# Patient Record
Sex: Female | Born: 1950
Health system: Southern US, Community
[De-identification: ages and names within clinical notes are randomized; demographics above are authoritative.]

## PROBLEM LIST (undated history)

## (undated) DIAGNOSIS — F419 Anxiety disorder, unspecified: Secondary | ICD-10-CM

## (undated) DIAGNOSIS — G4730 Sleep apnea, unspecified: Secondary | ICD-10-CM

## (undated) DIAGNOSIS — D649 Anemia, unspecified: Secondary | ICD-10-CM

## (undated) DIAGNOSIS — M5432 Sciatica, left side: Secondary | ICD-10-CM

## (undated) DIAGNOSIS — E119 Type 2 diabetes mellitus without complications: Secondary | ICD-10-CM

## (undated) DIAGNOSIS — C8300 Small cell B-cell lymphoma, unspecified site: Secondary | ICD-10-CM

## (undated) DIAGNOSIS — F32A Depression, unspecified: Secondary | ICD-10-CM

## (undated) DIAGNOSIS — M5431 Sciatica, right side: Secondary | ICD-10-CM

## (undated) DIAGNOSIS — K5090 Crohn's disease, unspecified, without complications: Secondary | ICD-10-CM

## (undated) DIAGNOSIS — K219 Gastro-esophageal reflux disease without esophagitis: Secondary | ICD-10-CM

## (undated) DIAGNOSIS — I10 Essential (primary) hypertension: Secondary | ICD-10-CM

## (undated) HISTORY — DX: Sciatica, left side: M54.31

## (undated) HISTORY — PX: BREAST LUMPECTOMY: SHX2

## (undated) HISTORY — DX: Sleep apnea, unspecified: G47.30

## (undated) HISTORY — DX: Type 2 diabetes mellitus without complications: E11.9

## (undated) HISTORY — DX: Crohn's disease, unspecified, without complications: K50.90

## (undated) HISTORY — DX: Small cell B-cell lymphoma, unspecified site: C83.00

## (undated) HISTORY — DX: Anemia, unspecified: D64.9

## (undated) HISTORY — DX: Essential (primary) hypertension: I10

## (undated) HISTORY — DX: Sciatica, left side: M54.32

## (undated) HISTORY — PX: THORACOTOMY/LOBECTOMY: SHX6116

---

## 1986-11-23 HISTORY — PX: ABDOMINAL HYSTERECTOMY: SHX81

## 1987-03-24 HISTORY — PX: VESICOVAGINAL FISTULA CLOSURE W/ TAH: SUR271

## 1998-04-09 ENCOUNTER — Ambulatory Visit (HOSPITAL_COMMUNITY): Admission: RE | Admit: 1998-04-09 | Discharge: 1998-04-09 | Payer: Self-pay | Admitting: Obstetrics & Gynecology

## 1998-07-05 ENCOUNTER — Encounter: Admission: RE | Admit: 1998-07-05 | Discharge: 1998-10-03 | Payer: Self-pay | Admitting: Internal Medicine

## 1999-03-25 ENCOUNTER — Other Ambulatory Visit: Admission: RE | Admit: 1999-03-25 | Discharge: 1999-03-25 | Payer: Self-pay | Admitting: Obstetrics & Gynecology

## 1999-07-15 ENCOUNTER — Encounter: Payer: Self-pay | Admitting: Obstetrics & Gynecology

## 1999-07-15 ENCOUNTER — Ambulatory Visit (HOSPITAL_COMMUNITY): Admission: RE | Admit: 1999-07-15 | Discharge: 1999-07-15 | Payer: Self-pay | Admitting: Obstetrics & Gynecology

## 1999-07-23 ENCOUNTER — Encounter: Payer: Self-pay | Admitting: Obstetrics & Gynecology

## 1999-07-23 ENCOUNTER — Ambulatory Visit (HOSPITAL_COMMUNITY): Admission: RE | Admit: 1999-07-23 | Discharge: 1999-07-23 | Payer: Self-pay | Admitting: Obstetrics & Gynecology

## 1999-10-22 ENCOUNTER — Encounter: Payer: Self-pay | Admitting: Internal Medicine

## 1999-10-22 ENCOUNTER — Encounter: Admission: RE | Admit: 1999-10-22 | Discharge: 1999-10-22 | Payer: Self-pay | Admitting: Internal Medicine

## 2000-04-26 ENCOUNTER — Other Ambulatory Visit: Admission: RE | Admit: 2000-04-26 | Discharge: 2000-04-26 | Payer: Self-pay | Admitting: Obstetrics & Gynecology

## 2000-09-07 ENCOUNTER — Encounter: Payer: Self-pay | Admitting: Obstetrics & Gynecology

## 2000-09-07 ENCOUNTER — Ambulatory Visit (HOSPITAL_COMMUNITY): Admission: RE | Admit: 2000-09-07 | Discharge: 2000-09-07 | Payer: Self-pay | Admitting: Obstetrics & Gynecology

## 2000-09-16 ENCOUNTER — Encounter: Payer: Self-pay | Admitting: Obstetrics & Gynecology

## 2000-09-16 ENCOUNTER — Encounter: Admission: RE | Admit: 2000-09-16 | Discharge: 2000-09-16 | Payer: Self-pay | Admitting: Obstetrics & Gynecology

## 2001-06-09 ENCOUNTER — Other Ambulatory Visit: Admission: RE | Admit: 2001-06-09 | Discharge: 2001-06-09 | Payer: Self-pay | Admitting: Obstetrics & Gynecology

## 2001-09-19 ENCOUNTER — Encounter: Payer: Self-pay | Admitting: Obstetrics & Gynecology

## 2001-09-19 ENCOUNTER — Ambulatory Visit (HOSPITAL_COMMUNITY): Admission: RE | Admit: 2001-09-19 | Discharge: 2001-09-19 | Payer: Self-pay | Admitting: Obstetrics & Gynecology

## 2002-07-04 ENCOUNTER — Other Ambulatory Visit: Admission: RE | Admit: 2002-07-04 | Discharge: 2002-07-04 | Payer: Self-pay | Admitting: Obstetrics & Gynecology

## 2002-09-04 ENCOUNTER — Ambulatory Visit (HOSPITAL_COMMUNITY): Admission: RE | Admit: 2002-09-04 | Discharge: 2002-09-04 | Payer: Self-pay | Admitting: Obstetrics & Gynecology

## 2002-09-04 ENCOUNTER — Encounter: Payer: Self-pay | Admitting: Obstetrics & Gynecology

## 2003-05-04 ENCOUNTER — Encounter: Admission: RE | Admit: 2003-05-04 | Discharge: 2003-05-04 | Payer: Self-pay | Admitting: Internal Medicine

## 2003-05-04 ENCOUNTER — Encounter: Payer: Self-pay | Admitting: Internal Medicine

## 2003-07-13 ENCOUNTER — Encounter (INDEPENDENT_AMBULATORY_CARE_PROVIDER_SITE_OTHER): Payer: Self-pay | Admitting: Specialist

## 2003-07-13 ENCOUNTER — Ambulatory Visit (HOSPITAL_COMMUNITY): Admission: RE | Admit: 2003-07-13 | Discharge: 2003-07-13 | Payer: Self-pay | Admitting: Gastroenterology

## 2003-08-03 ENCOUNTER — Encounter: Payer: Self-pay | Admitting: Emergency Medicine

## 2003-08-03 ENCOUNTER — Emergency Department (HOSPITAL_COMMUNITY): Admission: EM | Admit: 2003-08-03 | Discharge: 2003-08-03 | Payer: Self-pay | Admitting: Emergency Medicine

## 2003-08-06 ENCOUNTER — Encounter: Payer: Self-pay | Admitting: Emergency Medicine

## 2003-08-06 ENCOUNTER — Emergency Department (HOSPITAL_COMMUNITY): Admission: EM | Admit: 2003-08-06 | Discharge: 2003-08-06 | Payer: Self-pay | Admitting: Emergency Medicine

## 2003-08-22 ENCOUNTER — Other Ambulatory Visit: Admission: RE | Admit: 2003-08-22 | Discharge: 2003-08-22 | Payer: Self-pay | Admitting: Obstetrics & Gynecology

## 2003-09-21 ENCOUNTER — Encounter: Admission: RE | Admit: 2003-09-21 | Discharge: 2003-09-21 | Payer: Self-pay | Admitting: Gastroenterology

## 2003-10-12 ENCOUNTER — Inpatient Hospital Stay (HOSPITAL_COMMUNITY): Admission: AD | Admit: 2003-10-12 | Discharge: 2003-10-17 | Payer: Self-pay | Admitting: Gastroenterology

## 2003-12-11 ENCOUNTER — Encounter (HOSPITAL_COMMUNITY): Admission: RE | Admit: 2003-12-11 | Discharge: 2004-03-10 | Payer: Self-pay | Admitting: Gastroenterology

## 2004-05-27 ENCOUNTER — Ambulatory Visit (HOSPITAL_COMMUNITY): Admission: RE | Admit: 2004-05-27 | Discharge: 2004-05-27 | Payer: Self-pay | Admitting: Obstetrics & Gynecology

## 2004-09-05 ENCOUNTER — Other Ambulatory Visit: Admission: RE | Admit: 2004-09-05 | Discharge: 2004-09-05 | Payer: Self-pay | Admitting: Obstetrics & Gynecology

## 2004-10-02 ENCOUNTER — Encounter: Admission: RE | Admit: 2004-10-02 | Discharge: 2004-10-02 | Payer: Self-pay | Admitting: Allergy and Immunology

## 2005-02-04 ENCOUNTER — Ambulatory Visit (HOSPITAL_COMMUNITY): Admission: RE | Admit: 2005-02-04 | Discharge: 2005-02-04 | Payer: Self-pay | Admitting: Allergy and Immunology

## 2005-02-04 ENCOUNTER — Encounter: Payer: Self-pay | Admitting: Critical Care Medicine

## 2005-02-24 ENCOUNTER — Ambulatory Visit: Payer: Self-pay | Admitting: Critical Care Medicine

## 2005-02-26 ENCOUNTER — Ambulatory Visit: Payer: Self-pay | Admitting: Internal Medicine

## 2005-02-27 ENCOUNTER — Encounter: Admission: RE | Admit: 2005-02-27 | Discharge: 2005-02-27 | Payer: Self-pay | Admitting: Allergy and Immunology

## 2005-03-05 ENCOUNTER — Ambulatory Visit (HOSPITAL_COMMUNITY): Admission: RE | Admit: 2005-03-05 | Discharge: 2005-03-05 | Payer: Self-pay | Admitting: Critical Care Medicine

## 2005-03-05 ENCOUNTER — Encounter (INDEPENDENT_AMBULATORY_CARE_PROVIDER_SITE_OTHER): Payer: Self-pay | Admitting: *Deleted

## 2005-03-05 ENCOUNTER — Encounter: Payer: Self-pay | Admitting: Critical Care Medicine

## 2005-04-08 ENCOUNTER — Ambulatory Visit: Payer: Self-pay | Admitting: Critical Care Medicine

## 2005-05-11 ENCOUNTER — Ambulatory Visit: Payer: Self-pay | Admitting: Critical Care Medicine

## 2005-05-14 ENCOUNTER — Encounter (INDEPENDENT_AMBULATORY_CARE_PROVIDER_SITE_OTHER): Payer: Self-pay | Admitting: Specialist

## 2005-05-14 ENCOUNTER — Inpatient Hospital Stay (HOSPITAL_COMMUNITY)
Admission: RE | Admit: 2005-05-14 | Discharge: 2005-05-18 | Payer: Self-pay | Admitting: Thoracic Surgery (Cardiothoracic Vascular Surgery)

## 2005-05-14 ENCOUNTER — Encounter: Payer: Self-pay | Admitting: Thoracic Surgery (Cardiothoracic Vascular Surgery)

## 2005-05-14 ENCOUNTER — Encounter: Payer: Self-pay | Admitting: Critical Care Medicine

## 2005-05-17 ENCOUNTER — Ambulatory Visit: Payer: Self-pay | Admitting: Critical Care Medicine

## 2005-05-18 ENCOUNTER — Ambulatory Visit: Payer: Self-pay | Admitting: Oncology

## 2005-05-19 ENCOUNTER — Ambulatory Visit: Payer: Self-pay | Admitting: Oncology

## 2005-05-29 ENCOUNTER — Ambulatory Visit (HOSPITAL_COMMUNITY): Admission: RE | Admit: 2005-05-29 | Discharge: 2005-05-29 | Payer: Self-pay | Admitting: Oncology

## 2005-06-01 ENCOUNTER — Encounter
Admission: RE | Admit: 2005-06-01 | Discharge: 2005-06-01 | Payer: Self-pay | Admitting: Thoracic Surgery (Cardiothoracic Vascular Surgery)

## 2005-07-06 ENCOUNTER — Ambulatory Visit: Payer: Self-pay | Admitting: Critical Care Medicine

## 2005-07-07 ENCOUNTER — Ambulatory Visit (HOSPITAL_COMMUNITY): Admission: RE | Admit: 2005-07-07 | Discharge: 2005-07-07 | Payer: Self-pay | Admitting: Oncology

## 2005-07-09 ENCOUNTER — Ambulatory Visit: Payer: Self-pay | Admitting: Oncology

## 2005-09-01 ENCOUNTER — Ambulatory Visit (HOSPITAL_COMMUNITY): Admission: RE | Admit: 2005-09-01 | Discharge: 2005-09-01 | Payer: Self-pay | Admitting: Oncology

## 2005-09-03 ENCOUNTER — Ambulatory Visit: Payer: Self-pay | Admitting: Oncology

## 2005-09-24 ENCOUNTER — Ambulatory Visit (HOSPITAL_COMMUNITY): Admission: RE | Admit: 2005-09-24 | Discharge: 2005-09-24 | Payer: Self-pay | Admitting: Obstetrics & Gynecology

## 2005-09-25 ENCOUNTER — Other Ambulatory Visit: Admission: RE | Admit: 2005-09-25 | Discharge: 2005-09-25 | Payer: Self-pay | Admitting: Obstetrics & Gynecology

## 2005-10-01 ENCOUNTER — Ambulatory Visit: Payer: Self-pay | Admitting: Critical Care Medicine

## 2005-12-10 ENCOUNTER — Ambulatory Visit (HOSPITAL_COMMUNITY): Admission: RE | Admit: 2005-12-10 | Discharge: 2005-12-10 | Payer: Self-pay | Admitting: Oncology

## 2005-12-21 ENCOUNTER — Ambulatory Visit: Payer: Self-pay | Admitting: Oncology

## 2005-12-23 ENCOUNTER — Inpatient Hospital Stay (HOSPITAL_COMMUNITY): Admission: EM | Admit: 2005-12-23 | Discharge: 2005-12-28 | Payer: Self-pay | Admitting: Gastroenterology

## 2006-01-13 ENCOUNTER — Ambulatory Visit (HOSPITAL_COMMUNITY): Admission: RE | Admit: 2006-01-13 | Discharge: 2006-01-13 | Payer: Self-pay | Admitting: Gastroenterology

## 2006-02-08 ENCOUNTER — Ambulatory Visit: Payer: Self-pay | Admitting: Critical Care Medicine

## 2006-02-19 ENCOUNTER — Ambulatory Visit (HOSPITAL_COMMUNITY): Admission: RE | Admit: 2006-02-19 | Discharge: 2006-02-19 | Payer: Self-pay | Admitting: Gastroenterology

## 2006-03-04 ENCOUNTER — Ambulatory Visit: Payer: Self-pay | Admitting: Oncology

## 2006-03-04 LAB — COMPREHENSIVE METABOLIC PANEL
Alkaline Phosphatase: 56 U/L (ref 39–117)
BUN: 13 mg/dL (ref 6–23)
CO2: 29 mEq/L (ref 19–32)
Creatinine, Ser: 0.8 mg/dL (ref 0.4–1.2)
Glucose, Bld: 98 mg/dL (ref 70–99)
Sodium: 136 mEq/L (ref 135–145)
Total Bilirubin: 0.5 mg/dL (ref 0.3–1.2)
Total Protein: 7.6 g/dL (ref 6.0–8.3)

## 2006-03-09 ENCOUNTER — Ambulatory Visit (HOSPITAL_COMMUNITY): Admission: RE | Admit: 2006-03-09 | Discharge: 2006-03-09 | Payer: Self-pay | Admitting: Oncology

## 2006-03-16 LAB — CBC WITH DIFFERENTIAL/PLATELET
Basophils Absolute: 0 10*3/uL (ref 0.0–0.1)
Eosinophils Absolute: 0.1 10*3/uL (ref 0.0–0.5)
HGB: 10.9 g/dL — ABNORMAL LOW (ref 11.6–15.9)
LYMPH%: 39.2 % (ref 14.0–48.0)
MCV: 91.8 fL (ref 81.0–101.0)
MONO#: 0.4 10*3/uL (ref 0.1–0.9)
NEUT#: 1.6 10*3/uL (ref 1.5–6.5)
Platelets: 444 10*3/uL — ABNORMAL HIGH (ref 145–400)
RBC: 3.54 10*6/uL — ABNORMAL LOW (ref 3.70–5.32)
WBC: 3.5 10*3/uL — ABNORMAL LOW (ref 3.9–10.0)

## 2006-06-18 ENCOUNTER — Ambulatory Visit: Payer: Self-pay | Admitting: Critical Care Medicine

## 2006-07-06 ENCOUNTER — Ambulatory Visit (HOSPITAL_COMMUNITY): Admission: RE | Admit: 2006-07-06 | Discharge: 2006-07-06 | Payer: Self-pay | Admitting: Oncology

## 2006-07-08 ENCOUNTER — Ambulatory Visit: Payer: Self-pay | Admitting: Oncology

## 2006-07-13 LAB — CBC WITH DIFFERENTIAL/PLATELET
Basophils Absolute: 0 10*3/uL (ref 0.0–0.1)
Eosinophils Absolute: 0.6 10*3/uL — ABNORMAL HIGH (ref 0.0–0.5)
HCT: 34.2 % — ABNORMAL LOW (ref 34.8–46.6)
LYMPH%: 17.9 % (ref 14.0–48.0)
MCV: 92.9 fL (ref 81.0–101.0)
MONO#: 0.6 10*3/uL (ref 0.1–0.9)
MONO%: 8.5 % (ref 0.0–13.0)
NEUT#: 4.3 10*3/uL (ref 1.5–6.5)
NEUT%: 64.7 % (ref 39.6–76.8)
Platelets: 550 10*3/uL — ABNORMAL HIGH (ref 145–400)
WBC: 6.7 10*3/uL (ref 3.9–10.0)

## 2006-07-14 ENCOUNTER — Ambulatory Visit: Admission: RE | Admit: 2006-07-14 | Discharge: 2006-07-14 | Payer: Self-pay | Admitting: Oncology

## 2006-07-14 ENCOUNTER — Encounter: Payer: Self-pay | Admitting: Vascular Surgery

## 2006-08-11 LAB — CBC WITH DIFFERENTIAL/PLATELET
Eosinophils Absolute: 0.7 10*3/uL — ABNORMAL HIGH (ref 0.0–0.5)
HGB: 11.3 g/dL — ABNORMAL LOW (ref 11.6–15.9)
LYMPH%: 16.4 % (ref 14.0–48.0)
MONO#: 0.8 10*3/uL (ref 0.1–0.9)
NEUT#: 4.5 10*3/uL (ref 1.5–6.5)
Platelets: 593 10*3/uL — ABNORMAL HIGH (ref 145–400)
RBC: 3.58 10*6/uL — ABNORMAL LOW (ref 3.70–5.32)
WBC: 7.2 10*3/uL (ref 3.9–10.0)

## 2006-08-12 LAB — PROTEIN ELECTROPHORESIS, SERUM
Alpha-1-Globulin: 5.6 % — ABNORMAL HIGH (ref 2.9–4.9)
Alpha-2-Globulin: 12.1 % — ABNORMAL HIGH (ref 7.1–11.8)
Beta 2: 7.8 % — ABNORMAL HIGH (ref 3.2–6.5)
Beta Globulin: 6.7 % (ref 4.7–7.2)
Gamma Globulin: 20.9 % — ABNORMAL HIGH (ref 11.1–18.8)
M-Spike, %: 0.23 g/dL

## 2006-08-12 LAB — COMPREHENSIVE METABOLIC PANEL
ALT: 8 U/L (ref 0–40)
AST: 12 U/L (ref 0–37)
Alkaline Phosphatase: 66 U/L (ref 39–117)
BUN: 14 mg/dL (ref 6–23)
Calcium: 9 mg/dL (ref 8.4–10.5)
Chloride: 101 mEq/L (ref 96–112)
Creatinine, Ser: 0.7 mg/dL (ref 0.40–1.20)
Sodium: 139 mEq/L (ref 135–145)

## 2006-08-31 ENCOUNTER — Ambulatory Visit: Payer: Self-pay | Admitting: Critical Care Medicine

## 2006-09-27 ENCOUNTER — Ambulatory Visit (HOSPITAL_COMMUNITY): Admission: RE | Admit: 2006-09-27 | Discharge: 2006-09-27 | Payer: Self-pay | Admitting: Obstetrics & Gynecology

## 2006-11-04 ENCOUNTER — Ambulatory Visit: Payer: Self-pay | Admitting: Oncology

## 2006-11-09 LAB — CBC WITH DIFFERENTIAL/PLATELET
Basophils Absolute: 0 10*3/uL (ref 0.0–0.1)
Eosinophils Absolute: 0.9 10*3/uL — ABNORMAL HIGH (ref 0.0–0.5)
HGB: 10.7 g/dL — ABNORMAL LOW (ref 11.6–15.9)
MONO#: 0.8 10*3/uL (ref 0.1–0.9)
NEUT#: 4.9 10*3/uL (ref 1.5–6.5)
Platelets: 689 10*3/uL — ABNORMAL HIGH (ref 145–400)
RBC: 3.49 10*6/uL — ABNORMAL LOW (ref 3.70–5.32)
RDW: 15.8 % — ABNORMAL HIGH (ref 11.3–14.5)
WBC: 7.8 10*3/uL (ref 3.9–10.0)

## 2006-12-21 ENCOUNTER — Encounter (HOSPITAL_COMMUNITY): Admission: RE | Admit: 2006-12-21 | Discharge: 2007-03-21 | Payer: Self-pay | Admitting: Gastroenterology

## 2007-01-05 ENCOUNTER — Ambulatory Visit (HOSPITAL_COMMUNITY): Admission: RE | Admit: 2007-01-05 | Discharge: 2007-01-05 | Payer: Self-pay | Admitting: Oncology

## 2007-01-06 ENCOUNTER — Ambulatory Visit: Payer: Self-pay | Admitting: Oncology

## 2007-01-11 LAB — CBC WITH DIFFERENTIAL/PLATELET
BASO%: 0.5 % (ref 0.0–2.0)
EOS%: 3.4 % (ref 0.0–7.0)
HCT: 29.6 % — ABNORMAL LOW (ref 34.8–46.6)
MCH: 30.3 pg (ref 26.0–34.0)
MCHC: 33 g/dL (ref 32.0–36.0)
MONO#: 0.5 10*3/uL (ref 0.1–0.9)
NEUT%: 61.3 % (ref 39.6–76.8)
RBC: 3.23 10*6/uL — ABNORMAL LOW (ref 3.70–5.32)
RDW: 18.6 % — ABNORMAL HIGH (ref 11.3–14.5)
WBC: 4.4 10*3/uL (ref 3.9–10.0)
lymph#: 1 10*3/uL (ref 0.9–3.3)

## 2007-01-13 LAB — COMPREHENSIVE METABOLIC PANEL
ALT: 8 U/L (ref 0–35)
AST: 11 U/L (ref 0–37)
Calcium: 9.2 mg/dL (ref 8.4–10.5)
Chloride: 103 mEq/L (ref 96–112)
Creatinine, Ser: 0.68 mg/dL (ref 0.40–1.20)
Sodium: 139 mEq/L (ref 135–145)
Total Bilirubin: 0.5 mg/dL (ref 0.3–1.2)
Total Protein: 7 g/dL (ref 6.0–8.3)

## 2007-01-13 LAB — PROTEIN ELECTROPHORESIS, SERUM
Alpha-1-Globulin: 5.7 % — ABNORMAL HIGH (ref 2.9–4.9)
Alpha-2-Globulin: 11.2 % (ref 7.1–11.8)
Beta 2: 7.2 % — ABNORMAL HIGH (ref 3.2–6.5)
Gamma Globulin: 20.4 % — ABNORMAL HIGH (ref 11.1–18.8)
M-Spike, %: 0.28 g/dL

## 2007-02-25 ENCOUNTER — Ambulatory Visit: Payer: Self-pay | Admitting: Critical Care Medicine

## 2007-04-08 ENCOUNTER — Ambulatory Visit: Payer: Self-pay | Admitting: Oncology

## 2007-04-12 LAB — CBC WITH DIFFERENTIAL/PLATELET
BASO%: 0.7 % (ref 0.0–2.0)
EOS%: 7.5 % — ABNORMAL HIGH (ref 0.0–7.0)
HCT: 35.4 % (ref 34.8–46.6)
LYMPH%: 24.1 % (ref 14.0–48.0)
MCH: 31.6 pg (ref 26.0–34.0)
MCHC: 34.6 g/dL (ref 32.0–36.0)
MONO%: 13.5 % — ABNORMAL HIGH (ref 0.0–13.0)
NEUT%: 54.2 % (ref 39.6–76.8)
Platelets: 404 10*3/uL — ABNORMAL HIGH (ref 145–400)
RBC: 3.88 10*6/uL (ref 3.70–5.32)

## 2007-07-05 ENCOUNTER — Ambulatory Visit (HOSPITAL_COMMUNITY): Admission: RE | Admit: 2007-07-05 | Discharge: 2007-07-05 | Payer: Self-pay | Admitting: Oncology

## 2007-07-08 ENCOUNTER — Ambulatory Visit: Payer: Self-pay | Admitting: Oncology

## 2007-08-03 ENCOUNTER — Ambulatory Visit: Payer: Self-pay | Admitting: Critical Care Medicine

## 2007-08-03 DIAGNOSIS — J309 Allergic rhinitis, unspecified: Secondary | ICD-10-CM | POA: Insufficient documentation

## 2007-08-03 DIAGNOSIS — K5090 Crohn's disease, unspecified, without complications: Secondary | ICD-10-CM | POA: Insufficient documentation

## 2007-08-03 DIAGNOSIS — I10 Essential (primary) hypertension: Secondary | ICD-10-CM | POA: Insufficient documentation

## 2007-08-03 DIAGNOSIS — C8590 Non-Hodgkin lymphoma, unspecified, unspecified site: Secondary | ICD-10-CM

## 2007-08-03 DIAGNOSIS — J45909 Unspecified asthma, uncomplicated: Secondary | ICD-10-CM

## 2007-09-08 ENCOUNTER — Ambulatory Visit: Payer: Self-pay | Admitting: Oncology

## 2007-09-13 ENCOUNTER — Encounter: Payer: Self-pay | Admitting: Critical Care Medicine

## 2007-09-13 LAB — CBC WITH DIFFERENTIAL/PLATELET
BASO%: 0.5 % (ref 0.0–2.0)
EOS%: 8.3 % — ABNORMAL HIGH (ref 0.0–7.0)
MCH: 31.1 pg (ref 26.0–34.0)
MCHC: 33.9 g/dL (ref 32.0–36.0)
NEUT%: 66 % (ref 39.6–76.8)
RBC: 3.44 10*6/uL — ABNORMAL LOW (ref 3.70–5.32)
RDW: 14.4 % (ref 11.3–14.5)
lymph#: 1 10*3/uL (ref 0.9–3.3)

## 2007-09-15 LAB — PROTEIN ELECTROPHORESIS, SERUM
Alpha-2-Globulin: 13.8 % — ABNORMAL HIGH (ref 7.1–11.8)
Beta 2: 8.3 % — ABNORMAL HIGH (ref 3.2–6.5)
Beta Globulin: 7.3 % — ABNORMAL HIGH (ref 4.7–7.2)
Gamma Globulin: 19.9 % — ABNORMAL HIGH (ref 11.1–18.8)
M-Spike, %: 0.34 g/dL

## 2007-09-28 ENCOUNTER — Encounter (HOSPITAL_COMMUNITY): Admission: RE | Admit: 2007-09-28 | Discharge: 2007-11-23 | Payer: Self-pay | Admitting: Gastroenterology

## 2007-09-30 ENCOUNTER — Ambulatory Visit (HOSPITAL_COMMUNITY): Admission: RE | Admit: 2007-09-30 | Discharge: 2007-09-30 | Payer: Self-pay | Admitting: Obstetrics & Gynecology

## 2007-11-11 ENCOUNTER — Ambulatory Visit: Payer: Self-pay | Admitting: Oncology

## 2007-11-24 ENCOUNTER — Encounter (HOSPITAL_COMMUNITY): Admission: RE | Admit: 2007-11-24 | Discharge: 2008-02-22 | Payer: Self-pay | Admitting: Gastroenterology

## 2007-12-23 ENCOUNTER — Ambulatory Visit: Payer: Self-pay | Admitting: Oncology

## 2007-12-27 ENCOUNTER — Encounter: Payer: Self-pay | Admitting: Critical Care Medicine

## 2007-12-27 LAB — CBC WITH DIFFERENTIAL/PLATELET
Basophils Absolute: 0 10*3/uL (ref 0.0–0.1)
EOS%: 2.6 % (ref 0.0–7.0)
Eosinophils Absolute: 0.1 10*3/uL (ref 0.0–0.5)
HGB: 11.8 g/dL (ref 11.6–15.9)
MCV: 93 fL (ref 81.0–101.0)
MONO%: 9.6 % (ref 0.0–13.0)
NEUT#: 2.5 10*3/uL (ref 1.5–6.5)
RBC: 3.79 10*6/uL (ref 3.70–5.32)
RDW: 14 % (ref 11.3–14.5)
WBC: 4 10*3/uL (ref 3.9–10.0)
lymph#: 1.1 10*3/uL (ref 0.9–3.3)

## 2008-01-12 ENCOUNTER — Ambulatory Visit: Payer: Self-pay | Admitting: Critical Care Medicine

## 2008-03-23 ENCOUNTER — Ambulatory Visit (HOSPITAL_COMMUNITY): Admission: RE | Admit: 2008-03-23 | Discharge: 2008-03-23 | Payer: Self-pay | Admitting: Oncology

## 2008-03-23 ENCOUNTER — Ambulatory Visit: Payer: Self-pay | Admitting: Oncology

## 2008-03-27 ENCOUNTER — Encounter: Payer: Self-pay | Admitting: Critical Care Medicine

## 2008-09-03 ENCOUNTER — Telehealth (INDEPENDENT_AMBULATORY_CARE_PROVIDER_SITE_OTHER): Payer: Self-pay | Admitting: *Deleted

## 2008-09-25 ENCOUNTER — Ambulatory Visit: Payer: Self-pay | Admitting: Oncology

## 2008-09-27 ENCOUNTER — Encounter: Payer: Self-pay | Admitting: Critical Care Medicine

## 2008-09-27 LAB — CBC WITH DIFFERENTIAL/PLATELET
BASO%: 0.3 % (ref 0.0–2.0)
Eosinophils Absolute: 0.4 10*3/uL (ref 0.0–0.5)
LYMPH%: 19.1 % (ref 14.0–48.0)
MCHC: 33.8 g/dL (ref 32.0–36.0)
MONO#: 0.6 10*3/uL (ref 0.1–0.9)
NEUT#: 4.5 10*3/uL (ref 1.5–6.5)
Platelets: 412 10*3/uL — ABNORMAL HIGH (ref 145–400)
RBC: 3.69 10*6/uL — ABNORMAL LOW (ref 3.70–5.32)
WBC: 6.8 10*3/uL (ref 3.9–10.0)
lymph#: 1.3 10*3/uL (ref 0.9–3.3)

## 2008-10-01 LAB — COMPREHENSIVE METABOLIC PANEL
ALT: 9 U/L (ref 0–35)
Albumin: 3.6 g/dL (ref 3.5–5.2)
CO2: 26 mEq/L (ref 19–32)
Calcium: 9.2 mg/dL (ref 8.4–10.5)
Chloride: 103 mEq/L (ref 96–112)
Glucose, Bld: 124 mg/dL — ABNORMAL HIGH (ref 70–99)
Potassium: 4.5 mEq/L (ref 3.5–5.3)
Sodium: 139 mEq/L (ref 135–145)
Total Bilirubin: 0.4 mg/dL (ref 0.3–1.2)
Total Protein: 7 g/dL (ref 6.0–8.3)

## 2008-10-01 LAB — PROTEIN ELECTROPHORESIS, SERUM
Albumin ELP: 50.3 % — ABNORMAL LOW (ref 55.8–66.1)
Beta Globulin: 6.7 % (ref 4.7–7.2)
M-Spike, %: 0.36 g/dL
Total Protein, Serum Electrophoresis: 7 g/dL (ref 6.0–8.3)

## 2008-10-03 ENCOUNTER — Ambulatory Visit (HOSPITAL_COMMUNITY): Admission: RE | Admit: 2008-10-03 | Discharge: 2008-10-03 | Payer: Self-pay | Admitting: Obstetrics & Gynecology

## 2008-12-14 ENCOUNTER — Ambulatory Visit: Payer: Self-pay | Admitting: Critical Care Medicine

## 2008-12-14 DIAGNOSIS — J018 Other acute sinusitis: Secondary | ICD-10-CM

## 2009-01-16 ENCOUNTER — Ambulatory Visit: Payer: Self-pay | Admitting: Critical Care Medicine

## 2009-03-26 ENCOUNTER — Ambulatory Visit: Payer: Self-pay | Admitting: Oncology

## 2009-03-27 ENCOUNTER — Ambulatory Visit (HOSPITAL_COMMUNITY): Admission: RE | Admit: 2009-03-27 | Discharge: 2009-03-27 | Payer: Self-pay | Admitting: Oncology

## 2009-03-28 LAB — CBC WITH DIFFERENTIAL/PLATELET
BASO%: 0.5 % (ref 0.0–2.0)
Basophils Absolute: 0 10*3/uL (ref 0.0–0.1)
EOS%: 3.6 % (ref 0.0–7.0)
Eosinophils Absolute: 0.2 10*3/uL (ref 0.0–0.5)
HCT: 33.6 % — ABNORMAL LOW (ref 34.8–46.6)
HGB: 11.7 g/dL (ref 11.6–15.9)
LYMPH%: 35.3 % (ref 14.0–49.7)
MCH: 32.6 pg (ref 25.1–34.0)
MCHC: 34.8 g/dL (ref 31.5–36.0)
MCV: 93.6 fL (ref 79.5–101.0)
MONO#: 0.5 10*3/uL (ref 0.1–0.9)
MONO%: 8.8 % (ref 0.0–14.0)
NEUT#: 3 10*3/uL (ref 1.5–6.5)
NEUT%: 51.8 % (ref 38.4–76.8)
Platelets: 300 10*3/uL (ref 145–400)
RBC: 3.59 10*6/uL — ABNORMAL LOW (ref 3.70–5.45)
RDW: 14 % (ref 11.2–14.5)
WBC: 5.7 10*3/uL (ref 3.9–10.3)
lymph#: 2 10*3/uL (ref 0.9–3.3)

## 2009-04-01 LAB — PROTEIN ELECTROPHORESIS, SERUM
Albumin ELP: 52.2 % — ABNORMAL LOW (ref 55.8–66.1)
Beta 2: 6.6 % — ABNORMAL HIGH (ref 3.2–6.5)
Gamma Globulin: 19 % — ABNORMAL HIGH (ref 11.1–18.8)
M-Spike, %: 0.36 g/dL

## 2009-05-24 ENCOUNTER — Ambulatory Visit: Payer: Self-pay | Admitting: Critical Care Medicine

## 2009-08-16 ENCOUNTER — Encounter (HOSPITAL_COMMUNITY): Admission: RE | Admit: 2009-08-16 | Discharge: 2009-11-14 | Payer: Self-pay | Admitting: Gastroenterology

## 2009-09-24 ENCOUNTER — Ambulatory Visit: Payer: Self-pay | Admitting: Oncology

## 2009-09-26 ENCOUNTER — Encounter: Payer: Self-pay | Admitting: Critical Care Medicine

## 2009-09-26 LAB — CBC WITH DIFFERENTIAL/PLATELET
BASO%: 0.7 % (ref 0.0–2.0)
HCT: 33.7 % — ABNORMAL LOW (ref 34.8–46.6)
HGB: 11.3 g/dL — ABNORMAL LOW (ref 11.6–15.9)
MCV: 95.8 fL (ref 79.5–101.0)
RBC: 3.52 10*6/uL — ABNORMAL LOW (ref 3.70–5.45)
WBC: 4.1 10*3/uL (ref 3.9–10.3)

## 2009-09-27 ENCOUNTER — Ambulatory Visit: Payer: Self-pay | Admitting: Critical Care Medicine

## 2009-10-04 ENCOUNTER — Ambulatory Visit (HOSPITAL_COMMUNITY): Admission: RE | Admit: 2009-10-04 | Discharge: 2009-10-04 | Payer: Self-pay | Admitting: Obstetrics & Gynecology

## 2009-10-08 ENCOUNTER — Encounter: Payer: Self-pay | Admitting: Critical Care Medicine

## 2009-11-01 ENCOUNTER — Ambulatory Visit: Payer: Self-pay | Admitting: Pulmonary Disease

## 2009-11-01 DIAGNOSIS — G4730 Sleep apnea, unspecified: Secondary | ICD-10-CM

## 2009-11-01 DIAGNOSIS — G4710 Hypersomnia, unspecified: Secondary | ICD-10-CM | POA: Insufficient documentation

## 2009-11-01 DIAGNOSIS — E119 Type 2 diabetes mellitus without complications: Secondary | ICD-10-CM

## 2009-11-09 ENCOUNTER — Ambulatory Visit (HOSPITAL_BASED_OUTPATIENT_CLINIC_OR_DEPARTMENT_OTHER): Admission: RE | Admit: 2009-11-09 | Discharge: 2009-11-09 | Payer: Self-pay | Admitting: Pulmonary Disease

## 2009-11-20 ENCOUNTER — Ambulatory Visit: Payer: Self-pay | Admitting: Pulmonary Disease

## 2009-11-27 ENCOUNTER — Ambulatory Visit: Payer: Self-pay | Admitting: Pulmonary Disease

## 2010-01-01 ENCOUNTER — Ambulatory Visit: Payer: Self-pay | Admitting: Pulmonary Disease

## 2010-01-03 ENCOUNTER — Encounter: Payer: Self-pay | Admitting: Pulmonary Disease

## 2010-01-31 ENCOUNTER — Ambulatory Visit: Payer: Self-pay | Admitting: Critical Care Medicine

## 2010-02-07 ENCOUNTER — Encounter: Payer: Self-pay | Admitting: Pulmonary Disease

## 2010-03-24 ENCOUNTER — Ambulatory Visit (HOSPITAL_COMMUNITY): Admission: RE | Admit: 2010-03-24 | Discharge: 2010-03-24 | Payer: Self-pay | Admitting: Oncology

## 2010-03-26 ENCOUNTER — Ambulatory Visit: Payer: Self-pay | Admitting: Oncology

## 2010-03-27 LAB — CBC WITH DIFFERENTIAL/PLATELET
HGB: 11.2 g/dL — ABNORMAL LOW (ref 11.6–15.9)
LYMPH%: 28.2 % (ref 14.0–49.7)
MCHC: 33.8 g/dL (ref 31.5–36.0)
MCV: 95 fL (ref 79.5–101.0)
NEUT#: 3.5 10*3/uL (ref 1.5–6.5)
RBC: 3.5 10*6/uL — ABNORMAL LOW (ref 3.70–5.45)
RDW: 14.5 % (ref 11.2–14.5)
WBC: 6.5 10*3/uL (ref 3.9–10.3)
lymph#: 1.8 10*3/uL (ref 0.9–3.3)

## 2010-04-01 ENCOUNTER — Ambulatory Visit: Payer: Self-pay | Admitting: Pulmonary Disease

## 2010-04-16 ENCOUNTER — Ambulatory Visit (HOSPITAL_COMMUNITY): Admission: RE | Admit: 2010-04-16 | Discharge: 2010-04-16 | Payer: Self-pay | Admitting: Gastroenterology

## 2010-06-24 ENCOUNTER — Ambulatory Visit (HOSPITAL_BASED_OUTPATIENT_CLINIC_OR_DEPARTMENT_OTHER): Payer: PRIVATE HEALTH INSURANCE | Admitting: Oncology

## 2010-06-26 LAB — CBC WITH DIFFERENTIAL/PLATELET
EOS%: 4.6 % (ref 0.0–7.0)
HCT: 35.8 % (ref 34.8–46.6)
HGB: 12.4 g/dL (ref 11.6–15.9)
LYMPH%: 39.6 % (ref 14.0–49.7)
MCH: 32.8 pg (ref 25.1–34.0)
MCV: 95 fL (ref 79.5–101.0)
MONO#: 0.6 10*3/uL (ref 0.1–0.9)
MONO%: 10.4 % (ref 0.0–14.0)
Platelets: 351 10*3/uL (ref 145–400)
RBC: 3.78 10*6/uL (ref 3.70–5.45)
RDW: 13.8 % (ref 11.2–14.5)
WBC: 5.4 10*3/uL (ref 3.9–10.3)

## 2010-06-30 LAB — PROTEIN ELECTROPHORESIS, SERUM
Alpha-2-Globulin: 10.2 % (ref 7.1–11.8)
Gamma Globulin: 20.7 % — ABNORMAL HIGH (ref 11.1–18.8)

## 2010-07-29 ENCOUNTER — Ambulatory Visit: Payer: Self-pay | Admitting: Critical Care Medicine

## 2010-09-03 ENCOUNTER — Encounter: Payer: Self-pay | Admitting: Critical Care Medicine

## 2010-09-29 ENCOUNTER — Ambulatory Visit: Payer: Self-pay | Admitting: Pulmonary Disease

## 2010-10-07 ENCOUNTER — Ambulatory Visit (HOSPITAL_COMMUNITY): Admission: RE | Admit: 2010-10-07 | Discharge: 2010-10-07 | Payer: Self-pay | Admitting: Obstetrics & Gynecology

## 2010-12-14 ENCOUNTER — Encounter: Payer: Self-pay | Admitting: Oncology

## 2010-12-23 NOTE — Assessment & Plan Note (Signed)
Summary: 6 months/apc   Copy to:  Dr. Asencion Noble Primary Provider/Referring Provider:  Mertha Finders  CC:  6 month follow up.  History of Present Illness: 60 year old, African-American female with moderate persistent asthma & obstructive sleep apnea .    May 24, 2009 10:54 AM will occ awaken 2-3times a year with GERD symptoms at night   ONO showed nadir desaturation to 83%. Reviewed PSG >>poor sleep efficiency, AHI 45/h, RDI 61/h, predominnant hypopneas causing sleep fragmentation & oxygen desaturation to 83%. No new complaints.  September 29, 2010 3:45 PM  reviewed download  1/13 - 2/11 avg use 3.3 h, avg pr 11 cm, no residual events download 2/27 - 02/07/10  on auto 5-12 >> good compliance, avg pr 11 cm Better compliance in march than feb, changed to fixed pr 11 cm She states that she does feel better rested during the day - able to use CPPA 4-5 hrs/ night, mask ok, pressure OK     Preventive Screening-Counseling & Management  Alcohol-Tobacco     Smoking Status: never  Current Medications (verified): 1)  Azathioprine 50 Mg  Tabs (Azathioprine) .... Three By Mouth Daily 2)  Singulair 10 Mg  Tabs (Montelukast Sodium) .... One Daily 3)  Actos 30 Mg  Tabs (Pioglitazone Hcl) .... One By Mouth Once Daily 4)  Premarin 0.625 Mg Tabs (Estrogens Conjugated) .... Take 1 Tablet By Mouth Once A Day 5)  Zoloft 100 Mg  Tabs (Sertraline Hcl) .... One By Mouth Once Daily 6)  Benicar Hct 20-12.5 Mg  Tabs (Olmesartan Medoxomil-Hctz) .... One By Mouth Once Daily 7)  Multivitamins   Tabs (Multiple Vitamin) .... One By Mouth Once Daily 8)  Advair Diskus 100-50 Mcg/dose  Misc (Fluticasone-Salmeterol) .... One Puff Twice Daily 9)  Amitriptyline Hcl 50 Mg Tabs (Amitriptyline Hcl) .... Once Daily 10)  Omeprazole 20 Mg  Tbec (Omeprazole) .[name].. 1 By Mouth Daily 11)  Calcium 600/vitamin D 600-400 Mg-Unit Tabs (Calcium Carbonate-Vitamin D) .[name].. 1 By Mouth Daily 12)  Vitamin D 400 Unit Tabs  (Cholecalciferol) .[name].. 1 By Mouth Daily 13)  Cpap 11 Cm .... [facility]  Allergies (verified): No Known Drug Allergies  Past History:  Past Medical History: Last updated: 11/01/2009 Allergic rhinitis Asthma Hypertension B Cell lymphoma  R supra hilar mass    -Needle Bx  positive 2006 Crohns Disease HTN chronic anemia Diabetes, Type 2  Social History: Last updated: 11/01/2009 Patient never smoked.  Marital Status: married, lives with spouse Children: 2  Occupation: disabled   Review of Systems       The patient complains of dyspnea on exertion.  The patient denies anorexia, fever, weight loss, weight gain, vision loss, decreased hearing, hoarseness, chest pain, syncope, peripheral edema, prolonged cough, headaches, hemoptysis, abdominal pain, melena, hematochezia, severe indigestion/heartburn, hematuria, muscle weakness, suspicious skin lesions, transient blindness, difficulty walking, depression, unusual weight change, abnormal bleeding, enlarged lymph nodes, and angioedema.    Vital Signs:  Patient profile:   60 year old female Height:      64 inches Weight:      235 pounds BMI:     40.48 O2 Sat:      94 % on Room air Temp:     98.6 degrees F oral Pulse rate:   73 / minute BP sitting:   112 / 78  (left arm) Cuff size:   large  Vitals Entered By: Iran Planas CMA (September 29, 2010 2:13 PM)  O2 Flow:  Room air CC: 6  month follow up Comments Medications reviewed with patient Verified contact number and pharmacy with patient Iran Planas CMA  September 29, 2010 2:13 PM    Physical Exam  Additional Exam:  Gen: Pleasant, well nourished, in no distress ENT: no lesions, no postnasal drip, class 2 airway Neck: No JVD, no TMG, no carotid bruits Lungs: no use of accessory muscles, no dullness to percussion, clear without rales or rhonchi Cardiovascular: RRR, heart sounds normal, no murmurs or gallops, no peripheral edema Musculoskeletal: No deformities, no cyanosis  or clubbing     Impression & Recommendations:  Problem # 1:  HYPERSOMNIA, ASSOCIATED WITH SLEEP APNEA (ICD-780.53)  Compliance encouraged, wt loss emphasized, asked to avoid meds with sedative side effects, cautioned against driving when sleepy.  Stay on 11 cm  Orders: Est. Patient Level III (66060)  Medications Added to Medication List This Visit: 1)  Cpap 11 Cm  .... [facility]  Patient Instructions: 1)  Copy sent to: dr gates 2)  Please schedule a follow-up appointment in 1 year. 3)  Your CPAP machine is set at 11 cm 4)  Make sure you chnage the filters every 6 months

## 2010-12-23 NOTE — Assessment & Plan Note (Signed)
Summary: 3 MONTSH/ M BW   Visit Type:  Follow-up Copy to:  Dr. Asencion Noble Primary Provider/Referring Provider:  Mertha Finders  CC:  Exam RmA-9. Pt here for 3 month sleep follow up. Pt states no complaints.  History of Present Illness: 60 year old, African-American female with moderate persistent asthma & obstructive sleep apnea .    January 16, 2009 11:37 AM At last ov pt had acute sinusitis Pt markedly improved.   Has finished pred /ABX.  May 24, 2009 10:54 AM will occ awaken 2-3times a year with GERD symptoms at night   ONO showed nadir desaturation to 83%. Reviewed PSG >>poor sleep efficiency, AHI 45/h, RDI 61/h, predominnant hypopneas causing sleep fragmentation & oxygen desaturation to 83%. No new complaints.  Apr 01, 2010 1:51 PM  reviewed download  1/13 - 2/11 avg use 3.3 h, avg pr 11 cm, no residual events download 2/27 - 3/18 on auto 5-12 >> good compliance, avg pr 11 cm Better compliance in march than feb She states that she does feel better rested during the day.       Current Medications (verified): 1)  Azathioprine 50 Mg  Tabs (Azathioprine) .... Three By Mouth Daily 2)  Singulair 10 Mg  Tabs (Montelukast Sodium) .... One Daily 3)  Actos 30 Mg  Tabs (Pioglitazone Hcl) .... One By Mouth Once Daily 4)  Premarin 0.625 Mg Tabs (Estrogens Conjugated) .... Take 1 Tablet By Mouth Once A Day 5)  Zoloft 100 Mg  Tabs (Sertraline Hcl) .... One By Mouth Once Daily 6)  Benicar Hct 20-12.5 Mg  Tabs (Olmesartan Medoxomil-Hctz) .... One By Mouth Once Daily 7)  Multivitamins   Tabs (Multiple Vitamin) .... One By Mouth Once Daily 8)  Advair Diskus 100-50 Mcg/dose  Misc (Fluticasone-Salmeterol) .... One Puff Twice Daily 9)  Amitriptyline Hcl 25 Mg Tabs (Amitriptyline Hcl) .... Take 1 Tab By Mouth At Bedtime 10)  Proventil 90 Mcg/act  Aers (Albuterol) .... One Puff As Needed 11)  Omeprazole 20 Mg  Tbec (Omeprazole) .[name].. 1 By Mouth Daily 12)  Calcium 600/vitamin D 600-400  Mg-Unit Tabs (Calcium Carbonate-Vitamin D) .[name].. 1 By Mouth Daily 13)  Vitamin D 400 Unit Tabs (Cholecalciferol) .[name].. 1 By Mouth Daily  Allergies (verified): No Known Drug Allergies  Past History:  Past Medical History: Last updated: 11/01/2009 Allergic rhinitis Asthma Hypertension B Cell lymphoma  R supra hilar mass    -Needle Bx  positive 2006 Crohns Disease HTN chronic anemia Diabetes, Type 2  Social History: Last updated: 11/01/2009 Patient never smoked.  Marital Status: married, lives with spouse Children: 2  Occupation: disabled   Review of Systems  The patient denies anorexia, fever, weight loss, weight gain, vision loss, decreased hearing, hoarseness, chest pain, syncope, dyspnea on exertion, peripheral edema, prolonged cough, headaches, hemoptysis, abdominal pain, melena, hematochezia, severe indigestion/heartburn, hematuria, muscle weakness, suspicious skin lesions, difficulty walking, depression, unusual weight change, and abnormal bleeding.    Vital Signs:  Patient profile:   59 year old female Height:      64 inches Weight:      230 pounds O2 Sat:      95 % on Room air Temp:     98.0 degrees F oral Pulse rate:   87 / minute BP sitting:   118 / 70  (right arm) Cuff size:   large  Vitals Entered By: Iran Planas CMA (Apr 01, 2010 1:34 PM)  O2 Flow:  Room air CC: Exam RmA-9. Pt here for 3 month  sleep follow up. Pt states no complaints Comments Medications reviewed with patient Verified contact number and pharmacy with patient Iran Planas CMA  Apr 01, 2010 1:36 PM    Physical Exam  Additional Exam:  Gen: Pleasant, well nourished, in no distress ENT: no lesions, no postnasal drip, class 2 airway Neck: No JVD, no TMG, no carotid bruits Lungs: no use of accessory muscles, no dullness to percussion, clear without rales or rhonchi Cardiovascular: RRR, heart sounds normal, no murmurs or gallops, no peripheral edema Musculoskeletal: No deformities,  no cyanosis or clubbing     Impression & Recommendations:  Problem # 1:  HYPERSOMNIA, ASSOCIATED WITH SLEEP APNEA (ICD-780.53) Compliance encouraged, wt loss emphasized, asked to avoid meds with sedative side effects, cautioned against driving when sleepy. change to fixed pressure 11 cm Discussed maintenance of CPAP. Orders: DME Referral (DME) Est. Patient Level III (15615)  Medications Added to Medication List This Visit: 1)  Cpap 11 Cm   Patient Instructions: 1)  Copy sent to: 2)  Please schedule a follow-up appointment in 6 months. 3)  Change to fixed level of 11 cm

## 2010-12-23 NOTE — Assessment & Plan Note (Signed)
Summary: Pulmonary OV   Copy to:  Dr. Asencion Noble Primary Provider/Referring Provider:  Mertha Finders  CC:  4 month follow up.  states breathing is doing better-denies wheezing and chest tightness and SOB.  states she has drainage and dry cough since Wednesday.  [name]  History of Present Illness: 60 year old, African-American female with moderate persistent asthma & obstructive sleep apnea .    January 16, 2009 11:37 AM At last ov pt had acute sinusitis Pt markedly improved.   Has finished pred /ABX.  May 24, 2009 10:54 AM will occ awaken 2-3times a year with GERD symptoms at night  November 01, 2009 12:17 PM  Sleep consult - Epworth Sleepiness Score 11/24, late bedtime, latency 30 mins, using elavil x 3 wks for chronic pain , wakes up at 10A with occasional headache, dry mouth, gained 20 lbs over 2 years. Husband has noted loud snoring but not witnessed apneas. Sleeps onher side with 2 pillows. ONO showed nadir desaturation to 83%. Reviewed PSG >>poor sleep efficiency, AHI 45/h, RDI 61/h, predominnant hypopneas causing sleep fragmentation & oxygen desaturation to 83%. No new complaints. 1/11 Trial of autoCPAP 5-12 , full face mask   January 01, 2010 4:18 PM   Pt states that she has been doing okay with cpap- "still getting used to it".  She is getting about 5-6 hours of sleep with cpap on at night.  She states that she feels some air leaking from mask when she moves in her sleep.  She states that she does feel better rested during the day. recurrent rhinitis but has not tolerated nasal steroids.   January 31, 2010 11:03 AM now on cpap and this has helped no real cough.  sl amount of mucous waiting on download for cpap   Asthma History    Asthma Control Assessment:    Age range: 12+ years    Symptoms: 0-2 days/week    Nighttime Awakenings: 0-2/month    Interferes w/ normal activity: no limitations    SABA use (not for EIB): 0-2 days/week    Exacerbations requiring oral  systemic steroids: 0-1/year    Asthma Control Assessment: Well Controlled   Preventive Screening-Counseling & Management  Alcohol-Tobacco     Smoking Status: never  Current Medications (verified): 1)  Azathioprine 50 Mg  Tabs (Azathioprine) .... Three By Mouth Daily 2)  Singulair 10 Mg  Tabs (Montelukast Sodium) .... One Daily 3)  Actos 30 Mg  Tabs (Pioglitazone Hcl) .... One By Mouth Once Daily 4)  Premarin 0.625 Mg Tabs (Estrogens Conjugated) .... Take 1 Tablet By Mouth Once A Day 5)  Zoloft 100 Mg  Tabs (Sertraline Hcl) .... One By Mouth Once Daily 6)  Benicar Hct 20-12.5 Mg  Tabs (Olmesartan Medoxomil-Hctz) .... One By Mouth Once Daily 7)  Multivitamins   Tabs (Multiple Vitamin) .... One By Mouth Once Daily 8)  Advair Diskus 100-50 Mcg/dose  Misc (Fluticasone-Salmeterol) .... One Puff Twice Daily 9)  Amitriptyline Hcl 25 Mg Tabs (Amitriptyline Hcl) .... Take 1 Tab By Mouth At Bedtime 10)  Proventil 90 Mcg/act  Aers (Albuterol) .... One Puff As Needed 11)  Omeprazole 20 Mg  Tbec (Omeprazole) .[name].. 1 By Mouth Daily 12)  Calcium 600/vitamin D 600-400 Mg-Unit Tabs (Calcium Carbonate-Vitamin D) .[name].. 1 By Mouth Daily 13)  Vitamin D 400 Unit Tabs (Cholecalciferol) .[name].. 1 By Mouth Daily  Allergies (verified): No Known Drug Allergies  Past History:  Past medical, surgical, family and social histories (including risk factors) reviewed,  and no changes noted (except as noted below).  Past Medical History: Reviewed history from 11/01/2009 and no changes required. Allergic rhinitis Asthma Hypertension B Cell lymphoma  R supra hilar mass    -Needle Bx  positive 2006 Crohns Disease HTN chronic anemia Diabetes, Type 2  Past Surgical History: Reviewed history from 11/01/2009 and no changes required. Hysterectomy-03/1987  Family History: Reviewed history from 11/01/2009 and no changes required. Family History Diabetes-mother Family History MI/Heart -father Liver  Cancer-mother  Social History: Reviewed history from 11/01/2009 and no changes required. Patient never smoked.  Marital Status: married, lives with spouse Children: 2  Occupation: disabled  Smoking Status:  never  Review of Systems  The patient denies shortness of breath with activity, shortness of breath at rest, productive cough, non-productive cough, coughing up blood, chest pain, irregular heartbeats, acid heartburn, indigestion, loss of appetite, weight change, abdominal pain, difficulty swallowing, sore throat, tooth/dental problems, headaches, nasal congestion/difficulty breathing through nose, sneezing, itching, ear ache, anxiety, depression, hand/feet swelling, joint stiffness or pain, rash, change in color of mucus, and fever.    Vital Signs:  Patient profile:   60 year old female Height:      64 inches Weight:      242.38 pounds BMI:     41.75 O2 Sat:      98 % on Room air Temp:     98.1 degrees F oral Pulse rate:   96 / minute BP sitting:   122 / 86  (left arm) Cuff size:   large  Vitals Entered By: Raymondo Band RN (January 31, 2010 10:35 AM)  O2 Flow:  Room air CC: 4 month follow up.  states breathing is doing better-denies wheezing, chest tightness and SOB.  states she has drainage and dry cough since Wednesday.   Comments Medications reviewed with patient Daytime contact number verified with patient. Raymondo Band RN  January 31, 2010 10:36 AM    Physical Exam  Additional Exam:  Gen: Pleasant, well nourished, in no distress ENT: no lesions, no postnasal drip, class 2 airway Neck: No JVD, no TMG, no carotid bruits Lungs: no use of accessory muscles, no dullness to percussion, clear without rales or rhonchi Cardiovascular: RRR, heart sounds normal, no murmurs or gallops, no peripheral edema Musculoskeletal: No deformities, no cyanosis or clubbing     Impression & Recommendations:  Problem # 1:  ASTHMA (ICD-493.90) Assessment Improved stable  asthma plan No change in inhaled medications.   Maintain treatment program as currently prescribed.  Problem # 2:  HYPERSOMNIA, ASSOCIATED WITH SLEEP APNEA (ICD-780.53) Assessment: Improved per dr Elsworth Soho  Complete Medication List: 1)  Azathioprine 50 Mg Tabs (Azathioprine) .... Three by mouth daily 2)  Singulair 10 Mg Tabs (Montelukast sodium) .... One daily 3)  Actos 30 Mg Tabs (Pioglitazone hcl) .... One by mouth once daily 4)  Premarin 0.625 Mg Tabs (Estrogens conjugated) .... Take 1 tablet by mouth once a day 5)  Zoloft 100 Mg Tabs (Sertraline hcl) .... One by mouth once daily 6)  Benicar Hct 20-12.5 Mg Tabs (Olmesartan medoxomil-hctz) .... One by mouth once daily 7)  Multivitamins Tabs (Multiple vitamin) .... One by mouth once daily 8)  Advair Diskus 100-50 Mcg/dose Misc (Fluticasone-salmeterol) .... One puff twice daily 9)  Amitriptyline Hcl 25 Mg Tabs (Amitriptyline hcl) .... Take 1 tab by mouth at bedtime 10)  Proventil 90 Mcg/act Aers (Albuterol) .... One puff as needed 11)  Omeprazole 20 Mg Tbec (Omeprazole) .[name].. 1 by mouth daily 12)  Calcium 600/vitamin D 600-400 Mg-unit Tabs (Calcium carbonate-vitamin d) .[name].. 1 by mouth daily 13)  Vitamin D 400 Unit Tabs (Cholecalciferol) .[name].. 1 by mouth daily  Other Orders: Est. Patient Level III ([id number])  Patient Instructions: 1)  No change in medications 2)  Return in 6 months    Appended Document: Pulmonary OV fax neville gates

## 2010-12-23 NOTE — Assessment & Plan Note (Signed)
Summary: sleep rov/apc   Copy to:  Dr. Asencion Noble Primary Provider/Referring Provider:  Mertha Finders  CC:  1 month followup.  Pt states that she has been doing okay with cpap- "still getting used to it".  She is getting about 5-6 hours of sleep with cpap on at night.  She states that she feels some air leaking from mask when she moves in her sleep.  She states that she does feel better rested during the day.[name]  History of Present Illness: 60 year old, African-American female with moderate persistent asthma & obstructive sleep apnea .    January 16, 2009 11:37 AM At last ov pt had acute sinusitis Pt markedly improved.   Has finished pred /ABX.  May 24, 2009 10:54 AM will occ awaken 2-3times a year with GERD symptoms at night  November 01, 2009 12:17 PM  Sleep consult - Epworth Sleepiness Score 11/24, late bedtime, latency 30 mins, using elavil x 3 wks for chronic pain , wakes up at 10A with occasional headache, dry mouth, gained 20 lbs over 2 years. Husband has noted loud snoring but not witnessed apneas. Sleeps onher side with 2 pillows. ONO showed nadir desaturation to 83%. Reviewed PSG >>poor sleep efficiency, AHI 45/h, RDI 61/h, predominnant hypopneas causing sleep fragmentation & oxygen desaturation to 83%. No new complaints. 1/11 Trial of autoCPAP 5-12 , full face mask   January 01, 2010 4:18 PM   Pt states that she has been doing okay with cpap- "still getting used to it".  She is getting about 5-6 hours of sleep with cpap on at night.  She states that she feels some air leaking from mask when she moves in her sleep.  She states that she does feel better rested during the day. recurrent rhinitis but has not tolerated nasal steroids.     Current Medications (verified): 1)  Azathioprine 50 Mg  Tabs (Azathioprine) .... Three By Mouth Daily 2)  Singulair 10 Mg  Tabs (Montelukast Sodium) .... One Daily 3)  Actos 30 Mg  Tabs (Pioglitazone Hcl) .... One By Mouth Once Daily 4)   Premarin 0.625 Mg Tabs (Estrogens Conjugated) .... Take 1 Tablet By Mouth Once A Day 5)  Zoloft 100 Mg  Tabs (Sertraline Hcl) .... One By Mouth Once Daily 6)  Benicar Hct 20-12.5 Mg  Tabs (Olmesartan Medoxomil-Hctz) .... One By Mouth Once Daily 7)  Multivitamins   Tabs (Multiple Vitamin) .... One By Mouth Once Daily 8)  Advair Diskus 100-50 Mcg/dose  Misc (Fluticasone-Salmeterol) .... One Puff Twice Daily 9)  Amitriptyline Hcl 25 Mg Tabs (Amitriptyline Hcl) .... Take 1 Tab By Mouth At Bedtime 10)  Proventil 90 Mcg/act  Aers (Albuterol) .... One Puff As Needed 11)  Omeprazole 20 Mg  Tbec (Omeprazole) .[name].. 1 By Mouth Daily 12)  Calcium 600/vitamin D 600-400 Mg-Unit Tabs (Calcium Carbonate-Vitamin D) .[name].. 1 By Mouth Daily 13)  Vitamin D 400 Unit Tabs (Cholecalciferol) .[name].. 1 By Mouth Daily  Allergies (verified): No Known Drug Allergies  Past History:  Past Medical History: Last updated: 11/01/2009 Allergic rhinitis Asthma Hypertension B Cell lymphoma  R supra hilar mass    -Needle Bx  positive 2006 Crohns Disease HTN chronic anemia Diabetes, Type 2  Social History: Last updated: 11/01/2009 Patient never smoked.  Marital Status: married, lives with spouse Children: 2  Occupation: disabled   Review of Systems  The patient denies anorexia, fever, weight loss, weight gain, vision loss, decreased hearing, hoarseness, chest pain, syncope, dyspnea on exertion,  peripheral edema, prolonged cough, headaches, hemoptysis, abdominal pain, melena, hematochezia, severe indigestion/heartburn, hematuria, muscle weakness, difficulty walking, depression, unusual weight change, and abnormal bleeding.    Vital Signs:  Patient profile:   60 year old female Weight:      241 pounds O2 Sat:      99 % on Room air Temp:     98.1 degrees F oral Pulse rate:   71 / minute BP sitting:   116 / 80  (left arm)  Vitals Entered By: Tilden Dome (January 01, 2010 4:14 PM)  O2 Flow:  Room air  Physical  Exam  Additional Exam:  Gen: Pleasant, well nourished, in no distress ENT: no lesions, no postnasal drip, class 2 airway Neck: No JVD, no TMG, no carotid bruits Lungs: no use of accessory muscles, no dullness to percussion, clear without rales or rhonchi Cardiovascular: RRR, heart sounds normal, no murmurs or gallops, no peripheral edema Musculoskeletal: No deformities, no cyanosis or clubbing     Impression & Recommendations:  Problem # 1:  HYPERSOMNIA, ASSOCIATED WITH SLEEP APNEA (ICD-780.53)  The pathophysiology of obstructive sleep apnea, it's cardiovascular consequences and modes of treatment including CPAP were discussed with the patient in great detail.  Compliance encouraged, wt loss emphasized, asked to avoid meds with sedative side effects, cautioned against driving when sleepy.  Review download  Orders: Est. Patient Level III ([id number])  Problem # 2:  ASTHMA (ICD-493.90) -controlled. recurrent rhinitis but has not tolerated nasal steroids  Patient Instructions: 1)  Please schedule a follow-up appointment in 3 months. 2)  Please turn the chip in , so we can review download.  Appended Document: sleep rov/apc reviewed download  1/13 - 2/11 avg use 3.3 h, avg pr 11 cm, no residual events let her know CPAP doing it's job when used, expectation is at least 4 h/ night  Appended Document: sleep rov/apc pt informed.

## 2010-12-23 NOTE — Letter (Signed)
Summary: [facility]   Imported By: Phillis Knack 09/15/2010 83:77:93  _____________________________________________________________________  External Attachment:    Type:   Image     Comment:   External Document

## 2010-12-23 NOTE — Assessment & Plan Note (Signed)
Summary: rov sleep study results ///kp   Visit Type:  Follow-up Copy to:  Dr. Asencion Noble Primary Provider/Referring Provider:  Mertha Finders  CC:  Followup to review PSG results.  Pt states no new complaints/concerns today.[name]  History of Present Illness: 60 year old, African-American female with moderate persistent asthma & obstructive sleep apnea .    January 16, 2009 11:37 AM At last ov pt had acute sinusitis Pt markedly improved.   Has finished pred /ABX.  May 24, 2009 10:54 AM will occ awaken 2-3times a year with GERD symptoms at night  September 27, 2009 2:16 PM Notes more sinus congestion.  Some cough .   No wheeze. No other issues noted   November 01, 2009 12:17 PM  Sleep consult - Epworth Sleepiness Score 11/24, late bedtime, latency 30 mins, using elavil x 3 wks for chronic pain , wakes up at 10A with occasional headache, dry mouth, gained 20 lbs over 2 years. Husband has noted loud snoring but not witnessed apneas. Sleeps onher side with 2 pillows. ONO showed nadir desaturation to 83%.  November 27, 2009 3:14 PM Reviewed PSG >>poor sleep efficiency, AHI 45/h, RDI 61/h, predominnant hypopneas causing sleep fragmentation & oxygen desaturation to 83%. No new complaints.    Current Medications (verified): 1)  Azathioprine 50 Mg  Tabs (Azathioprine) .... Three By Mouth Daily 2)  Singulair 10 Mg  Tabs (Montelukast Sodium) .... One Daily 3)  Actos 30 Mg  Tabs (Pioglitazone Hcl) .... One By Mouth Once Daily 4)  Premarin 0.625 Mg Tabs (Estrogens Conjugated) .... Take 1 Tablet By Mouth Once A Day 5)  Zoloft 100 Mg  Tabs (Sertraline Hcl) .... One By Mouth Once Daily 6)  Benicar Hct 20-12.5 Mg  Tabs (Olmesartan Medoxomil-Hctz) .... One By Mouth Once Daily 7)  Multivitamins   Tabs (Multiple Vitamin) .... One By Mouth Once Daily 8)  Advair Diskus 100-50 Mcg/dose  Misc (Fluticasone-Salmeterol) .... One Puff Twice Daily 9)  Amitriptyline Hcl 25 Mg Tabs (Amitriptyline Hcl) ....  Take 1 Tab By Mouth At Bedtime 10)  Proventil 90 Mcg/act  Aers (Albuterol) .... One Puff As Needed 11)  Omeprazole 20 Mg  Tbec (Omeprazole) .[name].. 1 By Mouth Daily 12)  Calcium 600/vitamin D 600-400 Mg-Unit Tabs (Calcium Carbonate-Vitamin D) .[name].. 1 By Mouth Daily 13)  Vitamin D 400 Unit Tabs (Cholecalciferol) .[name].. 1 By Mouth Daily  Allergies (verified): No Known Drug Allergies  Past History:  Past Medical History: Last updated: 11/01/2009 Allergic rhinitis Asthma Hypertension B Cell lymphoma  R supra hilar mass    -Needle Bx  positive 2006 Crohns Disease HTN chronic anemia Diabetes, Type 2  Social History: Last updated: 11/01/2009 Patient never smoked.  Marital Status: married, lives with spouse Children: 2  Occupation: disabled   Review of Systems  The patient denies anorexia, fever, weight loss, weight gain, vision loss, decreased hearing, hoarseness, chest pain, syncope, dyspnea on exertion, peripheral edema, prolonged cough, headaches, hemoptysis, abdominal pain, melena, hematochezia, severe indigestion/heartburn, hematuria, muscle weakness, suspicious skin lesions, difficulty walking, depression, unusual weight change, and abnormal bleeding.    Vital Signs:  Patient profile:   60 year old female Height:      64 inches Weight:      235.38 pounds O2 Sat:      97 % on Room air Temp:     98.3 degrees F oral Pulse rate:   89 / minute BP sitting:   114 / 76  (left arm) Cuff size:  large  Vitals Entered By: Tilden Dome (November 27, 2009 3:00 PM)  O2 Flow:  Room air  Physical Exam  Additional Exam:  Gen: Pleasant, well nourished, in no distress ENT: no lesions, no postnasal drip, class 2 airway Neck: No JVD, no TMG, no carotid bruits Lungs: no use of accessory muscles, no dullness to percussion, clear without rales or rhonchi Cardiovascular: RRR, heart sounds normal, no murmurs or gallops, no peripheral edema Musculoskeletal: No deformities, no cyanosis or  clubbing     Impression & Recommendations:  Problem # 1:  HYPERSOMNIA, ASSOCIATED WITH SLEEP APNEA (ICD-780.53) The pathophysiology of obstructive sleep apnea, it's cardiovascular consequences and modes of treatment including CPAP were discussed with the patient in great detail.  Compliance encouraged, wt loss emphasized, asked to avoid meds with sedative side effects, cautioned against driving when sleepy.  Trial of autoCPAP 5-12 , full face mask  Orders: Est. Patient Level III (12458) DME Referral (DME)  Problem # 2:  ASTHMA (ICD-493.90) well controlled  Patient Instructions: 1)  Please schedule a follow-up appointment in 1 month. 2)  We will set you up with CPAP machine

## 2010-12-23 NOTE — Assessment & Plan Note (Signed)
Summary: Pulmonary OV   Copy to:  Dr. Asencion Noble Primary Provider/Referring Provider:  Mertha Finders  CC:  follow up, Pt states breathing is better from last visit, pt states things seem to be going good and has no complaints today, pt states since being on cpap pt no longer has sob. Pt states uses cpap 7/7 nights, range from 4-7 hours a night, and pt has no problems with cpap.  History of Present Illness: 60 year old, African-American female with moderate persistent asthma & obstructive sleep apnea .    January 16, 2009 11:37 AM At last ov pt had acute sinusitis Pt markedly improved.   Has finished pred /ABX.  May 24, 2009 10:54 AM will occ awaken 2-3times a year with GERD symptoms at night   ONO showed nadir desaturation to 83%. Reviewed PSG >>poor sleep efficiency, AHI 45/h, RDI 61/h, predominnant hypopneas causing sleep fragmentation & oxygen desaturation to 83%. No new complaints.  Apr 01, 2010 1:51 PM  reviewed download  1/13 - 2/11 avg use 3.3 h, avg pr 11 cm, no residual events download 2/27 - 3/18 on auto 5-12 >> good compliance, avg pr 11 cm Better compliance in march than feb She states that she does feel better rested during the day.    July 29, 2010 3:29 PM Dypsnea has been ok.  No real chest pain.  No nocturnal symptoms. CPAP has helped.  Pt sees Dr Elsworth Soho for sleep disordered breathing.     No new issues.   Pt denies any significant sore throat, nasal congestion or excess secretions, fever, chills, sweats, unintended weight loss, pleurtic or exertional chest pain, orthopnea PND, or leg swelling Pt denies any increase in rescue therapy over baseline, denies waking up needing it or having any early am or nocturnal exacerbations of coughing/wheezing/or dyspnea.   Asthma History    Asthma Control Assessment:    Age range: 12+ years    Symptoms: 0-2 days/week    Nighttime Awakenings: 0-2/month    Interferes w/ normal activity: no limitations    SABA use  (not for EIB): 0-2 days/week    ATAQ questionnaire: 0    Exacerbations requiring oral systemic steroids: 0-1/year    Asthma Control Assessment: Well Controlled   Preventive Screening-Counseling & Management  Alcohol-Tobacco     Smoking Status: never  Current Medications (verified): 1)  Azathioprine 50 Mg  Tabs (Azathioprine) .... Three By Mouth Daily 2)  Singulair 10 Mg  Tabs (Montelukast Sodium) .... One Daily 3)  Actos 30 Mg  Tabs (Pioglitazone Hcl) .... One By Mouth Once Daily 4)  Premarin 0.625 Mg Tabs (Estrogens Conjugated) .... Take 1 Tablet By Mouth Once A Day 5)  Zoloft 100 Mg  Tabs (Sertraline Hcl) .... One By Mouth Once Daily 6)  Benicar Hct 20-12.5 Mg  Tabs (Olmesartan Medoxomil-Hctz) .... One By Mouth Once Daily 7)  Multivitamins   Tabs (Multiple Vitamin) .... One By Mouth Once Daily 8)  Advair Diskus 100-50 Mcg/dose  Misc (Fluticasone-Salmeterol) .... One Puff Twice Daily 9)  Amitriptyline Hcl 50 Mg Tabs (Amitriptyline Hcl) .... Once Daily 10)  Omeprazole 20 Mg  Tbec (Omeprazole) .[name].. 1 By Mouth Daily 11)  Calcium 600/vitamin D 600-400 Mg-Unit Tabs (Calcium Carbonate-Vitamin D) .[name].. 1 By Mouth Daily 12)  Vitamin D 400 Unit Tabs (Cholecalciferol) .[name].. 1 By Mouth Daily 13)  Cpap 11 Cm  Allergies (verified): No Known Drug Allergies  Past History:  Past medical, surgical, family and social histories (including risk factors) reviewed,  and no changes noted (except as noted below).  Past Medical History: Reviewed history from 11/01/2009 and no changes required. Allergic rhinitis Asthma Hypertension B Cell lymphoma  R supra hilar mass    -Needle Bx  positive 2006 Crohns Disease HTN chronic anemia Diabetes, Type 2  Past Surgical History: Reviewed history from 11/01/2009 and no changes required. Hysterectomy-03/1987  Family History: Reviewed history from 11/01/2009 and no changes required. Family History Diabetes-mother Family History MI/Heart -father Liver  Cancer-mother  Social History: Reviewed history from 11/01/2009 and no changes required. Patient never smoked.  Marital Status: married, lives with spouse Children: 2  Occupation: disabled   Review of Systems       The patient complains of shortness of breath with activity.  The patient denies shortness of breath at rest, productive cough, non-productive cough, coughing up blood, chest pain, irregular heartbeats, acid heartburn, indigestion, loss of appetite, weight change, abdominal pain, difficulty swallowing, sore throat, tooth/dental problems, headaches, nasal congestion/difficulty breathing through nose, sneezing, itching, ear ache, anxiety, depression, hand/feet swelling, joint stiffness or pain, rash, change in color of mucus, and fever.    Vital Signs:  Patient profile:   60 year old female Height:      64 inches Weight:      237.25 pounds O2 Sat:      97 % on Room air Temp:     98.1 degrees F oral Pulse rate:   77 / minute BP sitting:   116 / 80  (left arm) Cuff size:   large  Vitals Entered By: Raymondo Band RN (July 29, 2010 3:20 PM)  O2 Flow:  Room air CC: follow up, Pt states breathing is better from last visit, pt states things seem to be going good and has no complaints today, pt states since being on cpap pt no longer has sob. Pt states uses cpap 7/7 nights, range from 4-7 hours a night, pt has no problems with cpap Is Patient Diabetic? Yes Comments meds andallergies updated Daytime contact number verified with patient. Raymondo Band RN  July 29, 2010 3:21 PM    Physical Exam  Additional Exam:  Gen: Pleasant, well nourished, in no distress ENT: no lesions, no postnasal drip, class 2 airway Neck: No JVD, no TMG, no carotid bruits Lungs: no use of accessory muscles, no dullness to percussion, clear without rales or rhonchi Cardiovascular: RRR, heart sounds normal, no murmurs or gallops, no peripheral edema Musculoskeletal: No deformities, no cyanosis  or clubbing     Impression & Recommendations:  Problem # 1:  ASTHMA (ICD-493.90) Assessment Improved  stable asthma plan No change in inhaled medications.   Maintain treatment program as currently prescribed. flu vaccine today  Medications Added to Medication List This Visit: 1)  Amitriptyline Hcl 50 Mg Tabs (Amitriptyline hcl) .... Once daily  Complete Medication List: 1)  Azathioprine 50 Mg Tabs (Azathioprine) .... Three by mouth daily 2)  Singulair 10 Mg Tabs (Montelukast sodium) .... One daily 3)  Actos 30 Mg Tabs (Pioglitazone hcl) .... One by mouth once daily 4)  Premarin 0.625 Mg Tabs (Estrogens conjugated) .... Take 1 tablet by mouth once a day 5)  Zoloft 100 Mg Tabs (Sertraline hcl) .... One by mouth once daily 6)  Benicar Hct 20-12.5 Mg Tabs (Olmesartan medoxomil-hctz) .... One by mouth once daily 7)  Multivitamins Tabs (Multiple vitamin) .... One by mouth once daily 8)  Advair Diskus 100-50 Mcg/dose Misc (Fluticasone-salmeterol) .... One puff twice daily 9)  Amitriptyline Hcl 50  Mg Tabs (Amitriptyline hcl) .... Once daily 10)  Omeprazole 20 Mg Tbec (Omeprazole) .[name].. 1 by mouth daily 11)  Calcium 600/vitamin D 600-400 Mg-unit Tabs (Calcium carbonate-vitamin d) .[name].. 1 by mouth daily 12)  Vitamin D 400 Unit Tabs (Cholecalciferol) .[name].. 1 by mouth daily 13)  Cpap 11 Cm   Other Orders: Est. Patient Level III (40981) Flu Vaccine 55yr + MEDICARE PATIENTS ((X9147 Administration Flu vaccine - MCR ((W2956  Patient Instructions: 1)  No change in medications 2)  flu vaccine today 3)  Return in      6    months Prescriptions: SINGULAIR 10 MG  TABS (MONTELUKAST SODIUM) one daily  #30 x 6   Entered and Authorized by:   PElsie StainMD   Signed by:   PElsie StainMD on 07/29/2010   Method used:   Electronically to        [id number] (retail)       [address]      [place]  [id number]      Ph: [phone]  [phone]      Fax: [phone]  RxID:   [id number] DISKUS 100-50 MCG/DOSE  MISC (FLUTICASONE-SALMETEROL) one puff twice daily  #60 Each x 6   Entered and Authorized by:   PElsie StainMD   Signed by:   PElsie StainMD on 07/29/2010   Method used:   Electronically to        [facility] [id number] (retail)       [address]      [place] [place]  [address]      Ph: [phone] [phone]      Fax: [phone]  RxID:   [id number]  Prevention & Chronic Care Immunizations   Influenza vaccine: Fluvax 3+  (07/29/2010)    Tetanus booster: Not documented    Pneumococcal vaccine: Pneumovax  (10/04/2008)    H. zoster vaccine: Not documented  Colorectal Screening   Hemoccult: Not documented    Colonoscopy: Not documented  Other Screening   Pap smear: Not documented    Mammogram: Not documented    DXA bone density scan: Not documented   Smoking status: never  (07/29/2010)  Diabetes Mellitus   HgbA1C: Not documented    Eye exam: Not documented    Foot exam: Not documented   High risk foot: Not documented   Foot care education: Not documented    Urine microalbumin/creatinine ratio: Not documented  Lipids   Total Cholesterol: Not documented   LDL: Not documented   LDL Direct: Not documented   HDL: Not documented   Triglycerides: Not documented  Hypertension   Last Blood Pressure: 116 / 80  (07/29/2010)   Serum creatinine: Not documented   Serum potassium Not documented  Self-Management Support :    Diabetes self-management support: Not documented    Hypertension self-management support: Not documented   Nursing Instructions: Give Flu vaccine today   Flu Vaccine Consent Questions     Do you have a history of severe allergic reactions to this vaccine? no    Any prior history of allergic reactions to egg and/or gelatin? no    Do you have a sensitivity to the preservative Thimersol? no     Do you have a past history of Guillan-Barre  Syndrome? no    Do you currently have an acute febrile illness? no    Have you ever had a severe reaction to latex? no    Vaccine information given and explained to patient? yes    Are you currently pregnant? no    Lot Number:[id number]   Exp Date:05/23/2011   Site Given  Left Deltoid IM    .lbmedflu Raymondo Band RN  July 29, 2010 3:48 PM  Appended Document: Pulmonary OV fax [facility]

## 2010-12-25 ENCOUNTER — Other Ambulatory Visit: Payer: Self-pay | Admitting: Oncology

## 2010-12-25 ENCOUNTER — Ambulatory Visit (HOSPITAL_BASED_OUTPATIENT_CLINIC_OR_DEPARTMENT_OTHER): Payer: PRIVATE HEALTH INSURANCE | Admitting: Oncology

## 2010-12-25 DIAGNOSIS — C8589 Other specified types of non-Hodgkin lymphoma, extranodal and solid organ sites: Secondary | ICD-10-CM

## 2010-12-25 DIAGNOSIS — D638 Anemia in other chronic diseases classified elsewhere: Secondary | ICD-10-CM

## 2010-12-25 DIAGNOSIS — C8590 Non-Hodgkin lymphoma, unspecified, unspecified site: Secondary | ICD-10-CM

## 2010-12-25 LAB — CBC WITH DIFFERENTIAL/PLATELET
BASO%: 0.3 % (ref 0.0–2.0)
EOS%: 4.9 % (ref 0.0–7.0)
Eosinophils Absolute: 0.3 10*3/uL (ref 0.0–0.5)
HGB: 11.9 g/dL (ref 11.6–15.9)
MCV: 95.1 fL (ref 79.5–101.0)
WBC: 6.6 10*3/uL (ref 3.9–10.3)

## 2010-12-29 LAB — PROTEIN ELECTROPHORESIS, SERUM
Alpha-1-Globulin: 5.4 % — ABNORMAL HIGH (ref 2.9–4.9)
Gamma Globulin: 20.1 % — ABNORMAL HIGH (ref 11.1–18.8)
M-Spike, %: 0.42 g/dL

## 2011-04-07 NOTE — Assessment & Plan Note (Signed)
[place]                             PULMONARY OFFICE NOTE   NAME:Sonneborn, Kathryne Sharper                    MRN:          [id number]  DATE:08/03/2007                            DOB:          04-16-1951    Ms. Norment is an 60 year old African American female with a history of  moderate persistent asthma, allergic rhinitis, hypertension. Clinically,  she has done well. She is on the Advair 100/50 one spray b.i.d., Brovex  5 cc b.i.d., Singulair 10 mg daily, albuterol p.r.n., Nasonex p.r.n. She  has not required her required her rescue inhaler. She does have B-cell  lymphoma in the chest and this has been being followed by Dr.  Benay Spice  and it is stable at this time.   PHYSICAL EXAMINATION:  Temperature 98.0, blood pressure 108/68, pulse  79, saturation is 96% on room air.  CHEST: Showed to be clear without evidence of wheeze, rale or rhonchi.  CARDIAC: Showed a regular rate and rhythm without S3. Normal S1, S2.  ABDOMEN: Soft, nontender.  EXTREMITIES: Showed no edema or clubbing or venous disease.  SKIN: Was clear.  NEUROLOGIC: Was intact.  HEENT: Showed no jugular venous distention. No lymphadenopathy.  Oropharynx was clear.  NECK: Supple.   IMPRESSION:  My impression on this patient is that of moderate  persistent asthma, stable at this time.   PLAN:  Is to maintain inhaled medications as currently dosed and will  see the patient back in followup.     Burnett Harry Joya Gaskins, MD, [facility]  Electronically Signed    PEW/MedQ  DD: 08/03/2007  DT: 08/03/2007  Job #: [id number]

## 2011-04-10 NOTE — Assessment & Plan Note (Signed)
[place]                               PULMONARY OFFICE NOTE   NAME:[name], Ashley Sharper                    MRN:          [id number]  DATE:08/31/2006                            DOB:          1951-04-06    [name] is a 60 year old African-American female with history of chronic  dyspnea from asthmatic bronchitis, allergic rhinitis, Crohn's disease,  lymphoma B-cell type.  She has had some increase in heartburn, increased  cough with this, and sensation of something being in the throat,  particularly at night.  Her postnasal drainage has been helped with the  [facility] which she takes 5 mL twice daily.  She maintains Advair 100/50 one  spray b.i.d., vitamins daily, Benicar 12.5/20 daily, Zoloft 100 mg daily,  Premarin 0.625 mg daily, Actos 30 mg daily, Singulair 10 mg daily,  __________ 150 mg daily.   EXAM:  Temp 99.  Blood pressure 124/80.  Pulse 69.  Saturation 97% on room  air.  CHEST:  Diminished breath sounds with prolonged expiratory phase.  No wheeze  or rhonchi are noted.  CARDIAC:  Showed a regular rate and rhythm without S3.  Normal S1, S2.  ABDOMEN:  Soft, nontender.  EXTREMITIES:  No edema or clubbing.  SKIN:  Clear.   IMPRESSION:  Reflux disease.  For this, she will begin omeprazole 20 mg  daily and a reflux diet was given.  For postnasal drainage, she will  continue the BroveX twice daily.  For asthmatic bronchitis, she will  maintain Advair as prescribed and we will see the patient back in followup  in 4 months.  A flu vaccine was administered.       Burnett Harry Joya Gaskins, MD, FCCP      PEW/MedQ  DD:  08/31/2006  DT:  09/02/2006  Job #:  [id number]   cc:   Dwaine Deter, M.D.

## 2011-04-10 NOTE — Discharge Summary (Signed)
NAME:  [name], [name]                       ACCOUNT NO.:  [id number]   MEDICAL RECORD NO.:  [id number]                   PATIENT TYPE:  INP   LOCATION:  2956                                 FACILITY:  [facility]   PHYSICIAN:  Earle Gell, M.D.                DATE OF BIRTH:  [birth year]   DATE OF ADMISSION:  10/12/2003  DATE OF DISCHARGE:  10/17/2003                                 DISCHARGE SUMMARY   DISCHARGE DIAGNOSES:  1. Uncontrolled Crohn's proctocolitis.  2. Uncontrolled type 2 diabetes mellitus.  3. Depression.  4. Bence Jones proteinuria.  5. Carpal tunnel syndrome.  6. Panic attack.  7. Endometriosis.  8. Tension type headaches.  9. Hysterectomy.  10.      Palpitations with caffeine.   LABORATORY DATA:  White blood cell count 15,000, hemoglobin 10.6 g, platelet  count 683,000.  Comprehensive metabolic profile on admission was abnormal  for a glucose 271, albumin 2.6, glycosylated hemoglobin 7.1%, thiopurine  methyltransferase enzyme activity normal.   HOSPITAL COURSE:  Ms. [name] is a 60 year old female born 1951/05/05.  On July 13, 2003 Ms. [name] underwent proctocolonoscopy to the  cecum which revealed universal proctocolitis, probable Crohn's disease with  normal ileum.  September 21, 2003 upper GI and small follow through x-ray  revealed a 3 cm x 5 cm hiatal hernia with normal esophagus and small bowel.   Ms. [name] has been taking Cipro, Asacol, and prednisone as an outpatient  with poor results.  She continues to have intermittent nausea, vomiting,  abdominal discomfort, and diarrhea.  She complains of worsening fatigue.  On  May 01, 2003 she weighed 224 pounds.  On admission her weight had dropped to  173 pounds.   Ms. [name] was admitted to the hospital.  Her primary care physician,  Dwaine Deter, M.D., managed her diabetes which was uncontrolled as a  result of being on prednisone.  She was started on insulin.   For her Crohn's  colitis she was started on azathioprine.  Her thiopurine  methyltransferase enzyme level was normal.  She was placed on ADA diet.  At  discharge her diabetes was under control and she was tolerating azathioprine  without side effects.   For her Crohn's disease she was discharged on azathioprine 50 mg each  morning, Pentasa 1000 mg t.i.d., and prednisone 40 mg each morning which  will be tapered as an outpatient.   At discharge she __________ .                                               Earle Gell, M.D.    MJ/MEDQ  D:  10/29/2003  T:  10/29/2003  Job:  [id number]   cc:   Dwaine Deter, M.D.  [address]  [place]  [place] [address]  Fax: [phone]

## 2011-04-10 NOTE — H&P (Signed)
NAME:  [name], [name]                       ACCOUNT NO.:  [id number]   MEDICAL RECORD NO.:  [id number]                   PATIENT TYPE:  INP   LOCATION:  5573                                 [facility]ACILITY[facility]  [facility]   PHYSICIAN:  Earle Gell, M.D.                DATE OF BIRTH:  26-Dec-1950   DATE OF ADMISSION:  10/12/2003  DATE OF DISCHARGE:                                HISTORY & PHYSICAL   ADMISSION DIAGNOSES:  1. Uncontrolled universal proctocolitis, probable Crohn's disease.  2. Uncontrolled diabetes mellitus secondary to prednisone.   HISTORY:  Ms. Ashley Sharper. [name] is a 60 year old female born 1951/10/04.  On July 13, 2003, [name] underwent a proctocolonoscopy to the cecum  which revealed universal proctocolitis, probable Crohn's disease, with a  normal-appearing distal ileum endoscopically.   On September 21, 2003, an upper GI, small-bowel follow-through, x-ray series  revealed a 3 x 5 cm hiatal hernia with normal esophagus, stomach, and small  intestine.   Despite outpatient oral Cipro, Asacol, and prednisone, [name]  universal proctocolitis has not gone into remission.   On May 01, 2003, she weighed 224 pounds;  she now weighs 173 pounds.   [name] is experiencing profound fatigue along with her abdominal  discomfort and diarrhea.   MEDICATION ALLERGIES:  GLUCOPHAGE causes diarrhea.   CHRONIC MEDICATIONS:  1. Premarin 0.625 mg daily.  2. Zoloft 100 mg daily.  3. Actos 15 mg daily.  4. Prednisone 35 mg daily.  5. Asacol 1600 mg b.i.d.  6. Restoril 30 mg nightly.  7. Lisinopril/HCT 20/25 mg daily.  8. Allegra 60 to 90 mg daily.   PAST MEDICAL HISTORY:  1. Chronic type 2 diabetes mellitus.  2. Hypertension.  3. Depression.  4. Benz-Jones proteinuria.  5. Carpal tunnel syndrome.  6. Panic attacks.  7. Endometriosis.  8. Tension-type headaches.  9. Hysterectomy.  10.      Palpitations with caffeine.   FAMILY HISTORY:  Noncontributory.   HABITS:  [name] does not smoke cigarettes or consume alcohol.   Influenza vaccination given September 19, 2003.   PHYSICAL EXAMINATION:  VITAL SIGNS:  Weight 173 pounds (80 kg), temperature  97.9, pulse 88, blood pressure 130/90.  GENERAL APPEARANCE:  [name] appears healthy.  She is alert, attentive,  and ambulatory.  HEENT:  Sclerae not icteric.  Oropharynx normal.  Tympanic membranes and  external canals normal.  LUNGS:  Clear to auscultation.  CARDIAC:  Regular rhythm without murmurs, clicks, or rubs.  ABDOMEN:  Soft, flat, and nontender.  EXTREMITIES:  No edema.   ASSESSMENT:  1. Uncontrolled universal proctocolitis, probable Crohn's disease.  2. Uncontrolled diabetes mellitus, on oral prednisone.   PLAN:  1. Start intravenous Solu-Medrol.  2. Start oral Pentasa.  3. Start oral Azo therapin.  4. Internal medicine consult for diabetic control.  I suspect [name]     should be on insulin.  Earle Gell, M.D.    MJ/MEDQ  D:  10/12/2003  T:  10/13/2003  Job:  [id number]   cc:   Dwaine Deter, M.D.  [address]  [place] [address]  Fax: [phone]

## 2011-04-10 NOTE — Discharge Summary (Signed)
[name], [name] NO.:  [id number]   MEDICAL RECORD NO.:  [id number]          PATIENT TYPE:  INP   LOCATION:  2952                         FACILITY:  [facility]   PHYSICIAN:  Earle Gell, M.D.   DATE OF BIRTH:  09/23/1951   DATE OF ADMISSION:  12/23/2005  DATE OF DISCHARGE:  12/28/2005                                 DISCHARGE SUMMARY   DISCHARGE DIAGNOSES:  1.  Chronic universal Crohn's proctocolitis.  2.  Chronic B-cell non-Hodgkin's lymphoma of the lung.  3.  Gastroesophageal reflux disease with hiatal hernia.  4.  Type 2 diabetes mellitus.  5.  Hypertension.  6.  Hypercholesterolemia.  7.  Bence Jones proteinuria.  8.  Carpal tunnel syndrome.  9.  Depression with panic attacks.  10. Hysterectomy.  11. Endometriosis.  12. Tension type headaches.  13. Seasonal allergies.  14. Asthma.   DISCHARGE MEDICATIONS:  1.  Zoloft 100 mg daily.  2.  Singulair 10 mg daily.  3.  Actos 30 mg daily.  4.  Multivitamin daily.  5.  Nexium 40 mg daily.  6.  Premarin 0.625 mg daily.  7.  Benicar HCT 20/12.5 mg daily.  8.  Advair 250/50 2 puffs daily.  9.  Azathioprine 150 mg daily.   OFFICE FOLLOW-UP:  I will meet with [name] in my office in  approximately 2 weeks   HOSPITAL COURSE:  [name] is a 60 year old female born 29-Sep-1951. She was diagnosed with universal Crohn's proctocolitis July 13, 2003  by proctocolonoscopy to the cecum; inspection of her distal ileum was  normal. On September 21, 2003, her upper GI small bowel follow-through x-ray  series revealed a hiatal hernia and normal esophagus, stomach and small  intestine. In 2004, she was started on azathioprine. She received  intravenous Remicade 325 mg on December 11, 2003, December 25, 2003, and January 22, 2004. Prior to receiving Remicade,  her intermediate PPD skin test was  negative for tuberculosis. In June 2006, she was diagnosed with B-cell non-  Hodgkin's lymphoma by right  lung mass biopsy.   [name] was admitted to the hospital to manage a recurrence in her  Crohn's proctocolitis manifested by bloody diarrhea, intermittent nausea  with vomiting and anorexia. In addition she had a dry cough and rhinorrhea.   Her stool culture for enteric pathogens and stool screen for C difficile  toxin was negative. On December 25, 2005, she underwent an unprepped  proctocolonoscopy to the cecum which revealed active Crohn's colitis  involving the sigmoid colon and distal descending colon; the remainder of  her colonic mucosa and her rectal mucosa appeared intact and only minimally  inflamed. While in the hospital, she received mesalamine enemas 4 grams  daily. She received intravenous Remicade (5 mg/kg) 400 mg on December 25, 2005. She received only three doses of Solu-Medrol of which was started  December 23, 2005 and discontinued December 25, 2005.   At discharge, [name] is medically stable.   LABORATORY DATA:  White blood cell count 7900, hemoglobin 10.6 grams,  platelet count 708,000, MCV  89.2. Admission complete metabolic profile was  normal except for a random glucose of 144, albumin 2.0. Liver enzymes and  creatinine were completely normal. Hemoglobin A1c 6%. C difficile toxin  negative. Stool culture for enteric pathogens was negative.   DISPOSITION:  I will meet with [name] in my office in 2 weeks. Assuming  she is improving, I will schedule her for a second intravenous Remicade  infusion.   Given [name]'s multiple medical problems including Crohn's  proctocolitis, chronic B-cell lymphoma of the lung, and type 2 diabetes  mellitus, I have encouraged her to reapply for medical disability. Dr.  Mertha Finders, her primary care physician, also agrees with this  recommendation.           ______________________________  Earle Gell, M.D.     MJ/MEDQ  D:  01/06/2006  T:  01/06/2006  Job:  [id number]   cc:   Earle Gell, M.D.  Fax:  Clayton Benay Spice, M.D.  Fax: [phone]

## 2011-04-10 NOTE — H&P (Signed)
[name], [name]             ACCOUNT NO.:  [id number]   MEDICAL RECORD NO.:  [id number]          PATIENT TYPE:  INP   LOCATION:  NA                           FACILITY:  [place]   PHYSICIAN:  Revonda Standard. Roxan Hockey, M.D.DATE OF BIRTH:  1951-03-06   DATE OF ADMISSION:  05/14/2005  DATE OF DISCHARGE:                                HISTORY & PHYSICAL   CHIEF COMPLAINT:  poor breathing.[name]   HISTORY OF PRESENT ILLNESS:  The patient is a 60 year old black female with  a history of a supra-hilar right-sided lung mass, first discovered on a  chest x-ray approximately three years ago.  It has been followed  conservatively for some time.  Recently, more aggressive diagnostic  maneuvers have been performed, including a needle biopsy, which revealed  evidence of lymphocytic changes, but a definitive diagnosis of lymphoma  cannot be made.  The patient has been referred to Dr. Remo Lipps C. Hendrickson  for a surgical opinion, and after thorough discussions on the matter, the  patient wished to proceed with a video-assisted thorascopy and further  surgical intervention, based on intraoperative findings.  The patient has a  history of both restrictive and obstructive lung disease.  The pulmonary  function studies performed at [facility]  revealed an FEV-I of 1.22 or 55% predicted, an FVC of 1.49 or 50% predicted,  an FEF of 25/75 of a 68%, and an MVV of 44% predicted and a DLCO of 41%  predicted.  The patient currently denies a cough but has had difficulty with  a cough in the past.  It appears to be attributed to both gastroesophageal  reflux disease, as well as the previous use of ACE inhibitor.  She denies  hemoptysis or sputum production.  She does have some difficulty with  shortness of breath and dyspnea on exertion.  She has no paroxysmal  nocturnal dyspnea or orthopnea.  No recent fevers, chills, unexplained  weight loss or other constitutional symptoms.  She has a  remote history of  pneumonia, treated as an outpatient, in September 2004.  She has no history  of pulmonary embolism or deep venous thrombosis.  She has no significant  cardiac history, but does occasionally have palpitations.  No definite  diagnosis of cardiac arrhythmias, and some of her palpitations are related  to anxiety symptoms.  She has no previous cerebrovascular accident or TIA.  She will be admitted this hospitalization for the procedure.   PAST MEDICAL HISTORY:  1.  Gastroesophageal reflux disease.  2.  Hormone replacement therapy.  3.  Panic attacks/anxiety.  4.  Hypertension.  5.  Asthma.  6.  Crohn's disease.  7.  Diabetes mellitus type 2.  8.  Hypercholesterolemia.  9.  Seasonal allergies.  10. Remote history of pneumonia in September 2004.   PAST SURGICAL HISTORY:  Hysterectomy in 1988.   ALLERGIES:  No known drug allergies.  She does, however, have an intolerance  to ACE INHIBITORS, causing cough.   MEDICATIONS:  1.  Singulair 10 mg daily.  2.  Multivitamin one daily.  3.  Actos 30 mg daily.  4.  Nexium 40 mg daily.  5.  Zoloft 100 mg daily.  6.  Premarin 0.625 mg daily.  7.  Benicar 20/12.5 mg daily.  8.  Advair 250/50 mg, two puffs daily.  9.  Azathioprine 150 mg daily.   REVIEW OF SYSTEMS:  Otherwise remarkable for arthritis with muscle and joint  stiffness.  Some difficulty with headache and sinusitis as well as a sore  throat and mild dysphagia.   SOCIAL HISTORY:  She is married with two children.  Tobacco use:  None.  Alcohol use:  None.  Occupation:  She is retired.   FAMILY HISTORY:  Remarkable for diabetes in her mother.  She had one sister  who had premature coronary artery disease and is deceased from a myocardial  infarction at age 29.   PHYSICAL EXAMINATION:  VITAL SIGNS:  Blood pressure 138/84, heart rate 68,  respirations 16.  GENERAL:  This is a 60 year old African/American female, in no acute  distress, moderately obese.  HEENT:   Normocephalic and atraumatic.  Pupils equal, round, reactive to  light.  Extraocular movements intact.  Oral mucosa pink and moist.  No  lesions.  Sclerae anicteric.  Tympanic membranes clear.  NECK:  Supple.  No jugular venous distention.  She has palpable carotids  with a right-sided bruit, versus a transmitted murmur.  No lymphadenopathy,  no thyromegaly.  CHEST:  Symmetrical inspiration, unlabored.  There are some scattered  inspiratory wheezes, primarily in the bases.  No rhonchi or crackles.  HEART:  A regular rate and rhythm.  There is a 7-8/9 systolic aortic murmur.  No rubs, no gallops.  ABDOMEN:  Soft, nontender, non-distended, obese.  No organomegaly.  No  masses.  GENITOURINARY:  Deferred.  RECTAL:  Deferred.  EXTREMITIES:  No clubbing, cyanosis or edema.  She has palpable posterior  tibial bilateral.  NEUROLOGIC:  Nonfocal.  Alert and oriented x4.  Gait is steady.  Muscle  strength is equal and intact bilaterally grossly.  Deep tendon reflexes 1+  bilaterally.   ASSESSMENT:  Right upper lobe mass.   PLAN:  For a right video-assisted thorascopy with possible thoracotomy with  wedge resection of the right upper lobe lesion and possible lobectomy, all  depending upon surgical findings.      Margarito Liner  D:  05/12/2005  T:  05/12/2005  Job:  [id number]   cc:   Asencion Noble, M.D. [facility]   Dwaine Deter, M.D.  [address] [place]  [place] [address]  Fax: [phone]

## 2011-04-10 NOTE — Op Note (Signed)
   NAME:  [name], [name]                       ACCOUNT NO.:  [id number]   MEDICAL RECORD NO.:  [id number]                   PATIENT TYPE:  AMB   LOCATION:  ENDO                                 FACILITY:  [facility]   PHYSICIAN:  Earle Gell, M.D.                DATE OF BIRTH:  1950-12-01   DATE OF PROCEDURE:  07/13/2003  DATE OF DISCHARGE:                                 OPERATIVE REPORT   PROCEDURE:  Colonoscopy.   PROCEDURE INDICATION:  [name] is a 60 year old female with  intermittent bloody diarrhea.  Recent flexible proctosigmoidoscopy with  biopsy returned mild, nonspecific proctosigmoiditis.   ENDOSCOPIST:  Garlan Fair, M.D.   PREMEDICATION:  1. Versed 10 mg.  2. Demerol 75 mg.   DESCRIPTION OF PROCEDURE:  After obtaining informed consent, [name] was  placed in the left lateral decubitus position.  I administered intravenous  Demerol and intravenous Versed to achieve conscious sedation for the  procedure.  The patient's blood pressure, oxygen saturation, and cardiac  rhythm were monitored throughout the procedure and documented in the medical  record.   Anal inspection revealed two posterior anal fissures which are not draining  pus or blood.  Digital rectal exam was normal.  The Olympus adjustable  pediatric colonoscope was introduced into the rectum and advanced to the  cecum.  The ileocecal valve was intubated and the distal ileum inspected.  Colonic preparation for the exam today was excellent.   [name] has universal proctocolitis, characterized by friable mucosa  with numerous aphthous-appearing ulcers and a mucosal cobblestone  appearance.  Biopsies were taken.  The distal ileum appears normal.   ASSESSMENT:  Probably inflammatory bowel disease (Crohn's disease) involving  the rectum and colon; the ileum is normal.  Biopsy are pending.                                               Earle Gell, M.D.    MJ/MEDQ  D:  07/13/2003   T:  07/14/2003  Job:  [id number]   cc:   Dwaine Deter, M.D.  [address]  [place] [address]  Fax: [phone]

## 2011-04-10 NOTE — Assessment & Plan Note (Signed)
[place]                               PULMONARY OFFICE NOTE   NAME:[name], [name]                    MRN:          [id number]  DATE:06/18/2006                            DOB:          30-Dec-1950    [name] returns today in followup.  This is a 60 year old white female,  history of lymphoma, low grade B cell in the right lung, asthmatic  bronchitis, allergic rhinitis.  The patient is still having occasional  cough, minimal dyspnea.   Maintains on Advair 100/50, one spray b.i.d.  Nexium is now off and she is  not having reflux symptoms with this.  Maintaining Singulair 10 mg daily.   PHYSICAL EXAMINATION:  VITAL SIGNS:  Temp 97, blood pressure 114/80, pulse  67, saturation 97% on room air.  CHEST:  Showed diminished breath sounds with prolonged expiratory phase.  No  wheeze or rhonchi noted.  CARDIAC:  Showed a regular rate and rhythm without S3.  Normal S1 S2.  ABDOMEN:  Soft nontender.  EXTREMITIES:  Showed no edema or clubbing.  SKIN:  Clear.  NEUROLOGIC:  Intact.  HEENT:  Showed no jugular venous distention.  No lymphadenopathy.   IMPRESSION:  1.  Reflux disease.  2.  Asthmatic bronchitis.  3.  Now post nasal drip syndrome.   PLAN:  In addition to maintaining Advair, we will add Brovex 5 cc b.i.d. and  we will see the patient back in return followup in 2 months.                                   Burnett Harry Joya Gaskins, MD, FCCP   PEW/MedQ  DD:  06/18/2006  DT:  06/18/2006  Job #:  [id number]

## 2011-04-10 NOTE — Op Note (Signed)
[name], [name] NO.:  [id number]   MEDICAL RECORD NO.:  [id number]          PATIENT TYPE:  INP   LOCATION:  1572                         [facility]ACILITY:  [facility]   PHYSICIAN:  Earle Gell, M.D.   DATE OF BIRTH:  Apr 18, 1951   DATE OF PROCEDURE:  12/25/2005  DATE OF DISCHARGE:                                 OPERATIVE REPORT   PROCEDURE:  Unprepped proctocolonoscopy to the cecum.   INDICATIONS:  [name] is a 60 year old female born [birth year].  [name] was admitted to the hospital to evaluate bloody diarrhea. She  has a history of chronic universal Crohn's proctocolitis diagnosed August  2004. She chronically takes Imuran 150 mg daily. Her stool is negative for  C. Difficile toxin. Her preliminary stool culture is negative. She is  currently on prednisone to treat probable active Crohn's colitis.   ENDOSCOPIST:  Earle Gell, MD.   PREMEDICATION:  Versed 5 mg, Demerol 50 mg.   DESCRIPTION OF PROCEDURE:  After obtaining informed consent, [name] was  placed in the left lateral decubitus position. I administered intravenous  Demerol and intravenous Versed to achieve conscious sedation for the  procedure. The patient's blood pressure, oxygen saturation and cardiac  rhythm were monitored throughout the procedure and documented in the medical  record.   Anal inspection and digital rectal exam were normal. The Olympus adjustable  pediatric colonoscope was introduced into the rectum and advanced to the  proximal - mid ascending colon. The patient did not receive a colonic lavage  prep and there was solid stool involving primarily the transverse colon and  right colon. She received tap water enema that cleansed the left colon.   Endoscopic evaluation of [name] colon reveals active Crohn's colitis  involving the sigmoid colon and distal descending colon. The mucosa is  friable, edematous with scattered deep ulcers. The remainder of the  colonic  mucosa and rectal mucosa appears intact and minimally inflamed.   ASSESSMENT:  Active sigmoid Crohn's colitis.   PLAN:  Discontinue Solu-Medrol, infuse 400 mg intravenous Remicade. start  mesalamine enemas 4 grams daily, Imodium p.r.n. diarrhea.           ______________________________  Earle Gell, M.D.     MJ/MEDQ  D:  12/25/2005  T:  12/25/2005  Job:  [id number]

## 2011-04-10 NOTE — Assessment & Plan Note (Signed)
[place]                             PULMONARY OFFICE NOTE   NAME:[name], Ashley Sharper                    MRN:          [id number]  DATE:02/25/2007                            DOB:          05/30/1951    [name] is a 60 year old African American female with history of  lymphoma, B-cell, which is indolent and not being treated in the right  lung and asthma, allergic rhinitis.  She is on the [facility] which helps,  twice daily, 5 cc.  For allergic rhinitis she is on the Advair 500/50  one spray twice daily, Singulair 10 mg daily.  Other maintenance  medicines listed in the chart and are correct as reviewed.   EXAMINATION:  Temperature 98, blood pressure 134/90, pulse 68,  saturation 98% room air.  CHEST:  Showed distant breath sounds with prolonged expiratory phase, no  wheeze or rhonchi.  CARDIAC:  Showed a regular rate and rhythm, without S3; normal S1, S2.  ABDOMEN:  Soft, nontender.  EXTREMITIES:  Showed no edema or clubbing.  SKIN:  Clear.  NEUROLOGIC:  Intact.  HEENT:  Showed no jugular venous distention, lymphadenopathy, oropharynx  clear.  NECK:  Supple.   IMPRESSION:  That of moderate persistent asthma and associated B-cell  lymphoma which is indolent.   PLAN:  To maintain Advair as currently dosed, we will see the patient  back, return in followup at 4 months.     Burnett Harry Joya Gaskins, MD, [facility]  Electronically Signed    PEW/MedQ  DD: 02/25/2007  DT: 02/26/2007  Job #: [id number]   cc:   Dwaine Deter, M.D.

## 2011-04-10 NOTE — Discharge Summary (Signed)
[name], YAGER NO.:  [id number]   MEDICAL RECORD NO.:  [id number]          PATIENT TYPE:  INP   LOCATION:  3308                         FACILITY:  [facility]   PHYSICIAN:  Revonda Standard. Roxan Hockey, M.D.DATE OF BIRTH:  1951/08/25   DATE OF ADMISSION:  05/14/2005  DATE OF DISCHARGE:  05/18/2005                                 DISCHARGE SUMMARY   PRIMARY ADMITTING DIAGNOSES:  1.  Shortness of breath.  2.  Right lung mass.   ADDITIONAL AND DISCHARGE DIAGNOSES:  1.  Right lung mass.  2.  Non-Hodgkin's lymphoma.  3.  Gastroesophageal reflux disease.  4.  Hypertension.  5.  Asthma.  6.  History of Crohn disease.  7.  Type 2 non-insulin-dependent diabetes mellitus.  8.  Hyperlipidemia.   PROCEDURES PERFORMED:  Right VATS with right upper lobe and right middle  lobe wedge resection.   HISTORY:  The patient is a 60 year old black female who has been followed by  Dr. Inda [name] for about a year now with a right lung mass which was found on  chest x-ray which was performed to workup shortness of breath and a cough.  She has not had any hemoptysis or significant weight loss, and followup  chest x-ray of this mass did not show significant change. She recently did  have a chest x-ray which showed an increase in the size of the mass. A CT  scan was also performed which confirmed a right suprahilar mass which  tracked laterally to the pleural surface, is somewhere regular, and has some  air bronchograms within it. She had a needle biopsy done which initially  showed what were thought to be malignant cells but only came back  lymphoproliferative with no definite diagnosis. There was a suspicion, based  on the cytology, that it might be a lymphoma. For this reason, the patient  was referred to Dr. Modesto Charon for consideration of a right VATS  wedge resection. Dr. Roxan Hockey saw the patient and reviewed her x-ray and  agreed that this would be the best course of action  to obtain a definitive  diagnosis. He explained the risks, benefits and alternatives of the  procedure to the patient, and she agreed to proceed.   HOSPITAL COURSE:  She was admitted to [facility] on May 14, 2005,  where she underwent a right VATS with a right upper lobe and right middle  lobe wedge resection. She tolerated the procedure well. The final pathology  was consistent with non-Hodgkin's lymphoma. Postoperatively, the patient has  done very well. She initially had some mild nausea, but this resolved  without difficulty. Since then her diet has been advanced, and she presently  is tolerating a regular diet without difficulty.   From a pulmonary standpoint, she has remained stable. She had been treated  with aggressive pulmonary toilet measures and is maintaining O2 saturation  of greater than 90% on room air. Her chest tubes have been removed in a  stepwise fashion. Followup chest x-ray has remained stable. She will have PA  and lateral chest x-ray done on morning of May 18, 2005.  Her surgical  incision sites are all healing well. She is ambulating the halls without  difficulty. She has remained afebrile; vital signs have been stable. Her  most recent labs showed hemoglobin of 10.4, hematocrit 30.9, platelets 369,  white count 14.5. Sodium 135, potassium 4.1, BUN 6, creatinine 0.0. She has  been restarted on all of her regular home medications. She has remained  stable and is doing very well overall. It is felt that if her chest x-ray  shows no evidence of pneumothorax on May 18, 2005, and she is otherwise  doing well, she will be ready for discharge home at that time. An oncology  consult had been called, and hopefully they will see her on Monday prior to  discharge for further recommendations.   DISCHARGE MEDICATIONS:  1.  Tylox 1 to 2 q.4h. p.r.n. for pain.  2.  Zoloft 100 mg daily.  3.  Singulair 10 mg daily.  4.  Multivitamin daily.  5.  Actos 30 mg daily.   6.  Nexium 40 mg daily.  7.  Premarin 0.625 mg daily.  8.  Benicar 20/12.5 daily.  9.  Advair 250/50 two puffs daily.  10. Azathioprine 150 mg daily   DISCHARGE INSTRUCTIONS:  She is asked to refrain from driving, heavy lifting  or strenuous activity. She may continue ambulating daily and using her  incentive spirometer. She may shower daily and clean her incisions with soap  and water. She will continue same preoperative diet.   DISCHARGE FOLLOWUP:  She will see Dr. Roxan Hockey back in the office in 3  weeks with a chest x-ray. She will follow up with oncology as directed. CVTS  nurse will check her wounds in 1 week and remove her sutures. She will call  our office if she experiences any problems in the interim.      Gi   GC/MEDQ  D:  05/17/2005  T:  05/17/2005  Job:  [id number]   cc:   Asencion Noble, M.D. [facility]   Dwaine Deter, M.D.  [address] [place]  [place] [address]  Fax: [phone]   Lance Coon, M.D.  Fax: [phone]

## 2011-04-10 NOTE — Op Note (Signed)
[name], [name] NO.:  [id number]   MEDICAL RECORD NO.:  [id number]          PATIENT TYPE:  INP   LOCATION:  3308                         FACILITY:  [facility]   PHYSICIAN:  Revonda Standard. Hendrickson, M.D.DATE OF BIRTH:  [birth year]   DATE OF PROCEDURE:  05/14/2005  DATE OF DISCHARGE:                                 OPERATIVE REPORT   PREOPERATIVE DIAGNOSIS:  Right upper lobe mass, rule out lymphoma.   POSTOPERATIVE DIAGNOSIS:  Right upper lobe mass, rule out lymphoma.   PROCEDURE:  Right video-assisted thoracoscopy, right upper lobe wedge  resection, right middle lobe wedge resection.   SURGEON:  Revonda Standard. Roxan Hockey, M.D.   ASSISTANT:  John Giovanni, P.A.-C.   ANESTHESIA:  General.   FINDINGS:  Large mass in the anterior aspect of the right upper lobe.  Frozen section revealed the possible lymphoma. Additional studies to be  done.  No evidence of lung cancer, small palpable nodule within the right  middle lobe removed with wedge resection and sent for permanent section  only.   CLINICAL NOTE:  [name] is a 60 year old obese African-American female  who has had a right upper lobe mass for some time.  This was felt to be  likely due to scarring; however, it recently had shown some signs of  increase in size and needle biopsy was done, which was nondiagnostic but  there was suspicion that this could possibly represent lymphoma.  The  patient then was referred for surgical resection.  The option of resection  for definitive diagnosis versus continued radiologic follow-up were  discussed in detail with the patient. She wished to proceed with the  definitive diagnosis understanding and accepting the risk of surgery.   OPERATIVE NOTE:  [name] was brought to the preop holding area on May 14, 2005. There lines were placed for central venous access as well as  arterial blood pressure monitoring.  Intravenous antibiotics were  administered. She was taken  to the operating room, anesthetized and  intubated with a double-lumen endotracheal tube. PAS hose were placed for  DVT prophylaxis. A Foley catheter was placed as well. The patient was turned  to left lateral decubitus position and the right chest was prepped and  draped in usual fashion.   A transverse incision was made in the seventh intercostal space in the mid  axillary line.  It was carried through the skin and subcutaneous tissue. The  chest was entered bluntly using a hemostat. A port was inserted and  thoracoscope was placed through the port.  Initially there was a cross  ventilation of the lung, but by placing a suction catheter into the right-  sided bronchus, there was good deflation and exposure of the of the right  lung.  There were no abnormalities of the parietal pleura. There was no  significant pleural effusion.  Additional incisions were made in the  anterior axillary line in the fifth intercostal space as well as in the  posterior axillary line in the fifth intercostal space below the tip of the  scapula. This posterior incision was made into a  small utility thoracotomy.  The latissimus muscle was divided. The serratus muscle was spared and  retracted anteriorly.  Minimal spreading of the ribs was performed only to  allow palpation which was difficult due the patient's body habitus.  Only a  limited portion of the lung could be palpated.  There was obvious  abnormality of the right upper lobe, of the anterior aspect showing on the  visceral pleural surface. This area was a grayish and edematous and  corresponded to the abnormal area found on CT scan.  This area was palpated  and was hardened and was definitively identify as the lesion in question.  This area then was resected with a large wedge resection using multiple  firings of the 45 and Echelon staplers.  The specimen was removed through  the posterior incision and sent for frozen section. While awaiting the   frozen section, the lung was palpated as much as possible.  It was very  difficult to the patient's significant morbid obesity.  There was a small  nodule palpated in the middle lobe in the area accessible to a wedge  resection.  This small lesion was resected as well.  It was less than a  centimeter in diameter. This was done with a single firing of at the [facility]  stapler.   The staple lines were inspected. There was good hemostasis. The chest  irrigated with warm saline. A 28-French chest tube was placed through the  original port incision and directly to the apex and was secured with two #1  silk sutures. The frozen section returned with a suspicion for lymphoma with  additional studies to be done to make a diagnosis.  A single pericostal  suture was used to reapproximate the ribs.  The serratus was reattached with  a running zero Vicryl suture. The latissimus layer was closed with running  zero Vicryl suture and subcutaneous tissue and skin were closed in standard  fashion. Subcuticular closure used for both skin incisions.  All sponge,  needle and instrument counts were correct. The patient was taken from the  operating room to the postanesthetic care unit in stable condition.        SCH/MEDQ  D:  05/14/2005  T:  05/15/2005  Job:  [id number]   cc:   Asencion Noble, M.D. [facility]   Dwaine Deter, M.D.  [address]  [place] [address]  Fax: [phone]

## 2011-04-10 NOTE — H&P (Signed)
[name], [name] NO.:  [id number]   MEDICAL RECORD NO.:  [id number]          PATIENT TYPE:  INP   LOCATION:  1606                         [facility]ACILITY:  [facility]   PHYSICIAN:  Earle Gell, M.D.   DATE OF BIRTH:  21-Nov-1951   DATE OF ADMISSION:  12/23/2005  DATE OF DISCHARGE:                                HISTORY & PHYSICAL   ADMISSION DIAGNOSES:  1.  Universal Crohn's proctocolitis.  2.  B-cell non-Hodgkin's lymphoma.   HISTORY:  [name] is a 60 year old female born March 06, 1951. She  was diagnosed with universal Crohn's proctocolitis by July 13, 2003,  proctocolonoscopy to the cecum; her distal ileum appeared normal. On September 21, 2003, her upper GI small-bowel follow-through x-ray series revealed a  hiatal hernia and normal esophagus, stomach, and small intestine. In 2004  she was started on azathioprine. She received intravenous Remicade 325 mg on  December 11, 2003; December 25, 2003; and January 22, 2004. She was recently  diagnosed with B-cell non-Hodgkin's lymphoma (June 2006) by right lung mass  biopsy.   Ms. Quayle has bloody diarrhea, intermittent nausea with vomiting, and  anorexia despite taking Imuran. She stated taking her Pentasa due to its  ineffectiveness. Her weight has decreased 20 pounds since October 24, 2005.  She also has a dry cough and rhinorrhea.   MEDICATION ALLERGIES:  None. CAFFEINE causes palpitations. ACE INHIBITORS  cause cough. GLUCOPHAGE causes diarrhea.   PAST MEDICAL HISTORY:  1.  Gastroesophageal reflux disease with hiatal hernia.  2.  Crohn's proctocolitis diagnosed July 13, 2003, by colonoscopy.  3.  Type 2 diabetes mellitus.  4.  Hypertension.  5.  Hypercholesterolemia.  6.  Bence Jones proteinuria.  7.  Carpal tunnel syndrome.  8.  Depression with panic attacks.  9.  Hysterectomy.  10. Endometriosis.  11. Tension-type headaches.  12. Non-Hodgkin's lymphoma diagnosed by right lung mass biopsy.  13. Seasonal allergies.  14. Asthma.   HABITS:  Ms. Ziemba does not smoke cigarettes or consume alcohol.   FAMILY HISTORY:  Negative for cancer.   CHRONIC MEDICATIONS:  1.  Zoloft 100 mg daily.  2.  Singulair 10 mg daily.  3.  Actos 30 mg daily.  4.  Multivitamin daily.  5.  Nexium 40 mg daily.  6.  Premarin 0.625 mg daily.  7.  Benicar 20/12.5 mg daily.  8.  Advair 250/50 two puffs daily.  9.  Azathioprine 150 mg daily.  10. Pentasa 1500 mg b.i.d.   HEALTH MAINTENANCE:  Pneumovax October 20, 2005.   EXAMINATION:  VITAL SIGNS:  Weight 184 pounds (84 kg), blood pressure  120/70, temperature 97.9.  HEENT:  Nonicteric sclerae, normal oropharynx. Normal external canals and  tympanic membranes. No cervical adenopathy.  LUNGS:  Clear to auscultation. No wheezing.  CARDIAC:  Reveals a regular rhythm without murmurs.  ABDOMEN:  Soft, flat, and nontender.  EXTREMITIES:  Reveal no edema.  SKIN:  Warm and dry.   ASSESSMENT:  1.  Chronic universal Crohn's proctocolitis.  2.  B-cell non-Hodgkin's lymphoma.  3.  Type 2 diabetes mellitus.   PLAN:  Check stool for C. difficile toxin and enteric pathogens. Start  intravenous fluids. Solu-Medrol 40 mg q.12h. Start Remicade 5 mg/kg if no  infection is diagnosed. Schedule colonoscopy December 25, 2005.           ______________________________  Earle Gell, M.D.     MJ/MEDQ  D:  12/24/2005  T:  12/24/2005  Job:  [id number]   cc:   Dominica Severin B. Benay Spice, M.D.  Fax: [phone]   Dwaine Deter, M.D.  Fax: [phone]

## 2011-04-10 NOTE — Consult Note (Signed)
[name], [name] NO.:  [id number]   MEDICAL RECORD NO.:  [id number]          PATIENT TYPE:  INP   LOCATION:  3308                         FACILITY:  [facility]   PHYSICIAN:  Izola Price. Benay Spice, M.D. DATE OF BIRTH:  05-14-1951   DATE OF CONSULTATION:  DATE OF DISCHARGE:  05/18/2005                                   CONSULTATION   REASON FOR CONSULTATION:  Newly diagnosed low grade non-Hodgkins lymphoma.   HISTORY OF PRESENT ILLNESS:  [name] is a 60 year old woman who has been  followed with routine chest x-rays for the past 1-2 years for a right sided  lung mass.  Chest CT on February 26, 2005, showed a right upper lobe mass  measuring 2.9 by 2.3 by 3.5 cm abutting the parietal pleura anteriorly and  medially as well as scattered nonspecific nodular densities in both lungs  with the largest being in the left upper lobe measuring 5 mm in diameter.  The visualized portion of the upper abdomen was normal.  She underwent a CT  guided biopsy of the mass on March 05, 2005, which showed an atypical  lymphoproliferative process suspicious for lymphoma.  She was referred to  Dr. Roxan Hockey and underwent a right VATS procedure with right upper lobe  and right middle lobe wedge resection on May 14, 2005.  In addition to the  right upper lobe mass, she was found to have a right middle lobe lesion.  Final pathology on both lesions showed a low grade B-cell non-Hodgkins  lymphoma, marginal zone type.  Flow cytometry showed a monoclonal B-cell  population expressing IGG and showing Kappa like chain restriction with the  tumor cells positive for CD20, CD10, CD21, CD22, and negative for CD23,  CD10, and CD5.  Oncology consultation has been requested for further  evaluation.   PAST MEDICAL HISTORY:  1.  GERD.  2.  Hypertension.  3.  Crohn's disease treated in the past with prednisone, Remicade, and      currently on Imuran.  4.  Asthma.  5.  Diabetes mellitus type 2.  6.   Hypercholesterolemia.  7.  Depression.  8.  Hysterectomy in 1988.  9.  G2P2.   CURRENT MEDICATIONS:  Imuran 150 mg daily, Dulcolax 10 mg daily, Zinacef 1.5  grams q.12h., Premarin 0.625 mg daily, Advair b.i.d., hydrochlorothiazide  12.5 mg daily, sliding scale insulin, Avapro 150 mg daily, Singular 10 mg  daily, multi-vitamins daily, Protonix 80 mg daily, Actos 30 mg daily, Zoloft  100 mg daily.   ALLERGIES:  No known drug allergies; intolerance to ACE inhibitors.   FAMILY HISTORY:  Mother with history of hypertension and diabetes mellitus;  father with hypertension, sister with hypertension, sister deceased age 1  with an MI.   SOCIAL HISTORY:  [name] lives in [place], she is married, she has  two daughters.  No history of ETOH or tobacco use.  She has never had a  blood transfusion.   REVIEW OF SYMPTOMS:  [name] denies any fever or sweats.  She denies  weight loss.  Appetite is good.  She reports shortness  of breath with  exertion.  She has had a cough in the past related to an ACE inhibitor.  She  denies any chest pain.  She has had no recent change in bowel habits.  She  reports occasional bloody drainage from the rectum related to the Crohn's  disease.  She denies any hematuria or dysuria.  She denies the presence of  any enlarged lymph nodes.   PHYSICAL EXAMINATION:  VITAL SIGNS:  Temperature 97.7, heart rate 88, blood pressure 130/40, oxygen  saturation 93% on room air.  GENERAL:  Pleasant African American female in no acute distress.  HEENT:  Normocephalic, atraumatic, oropharynx without thrush or ulceration.  LYMPH NODES:  No palpable cervical, supraclavicular, axillary, or inguinal  lymph nodes.  CHEST:  Lungs clear bilaterally.  CARDIOVASCULAR:  Regular rate and rhythm.  ABDOMEN:  Soft, obese, no hepatosplenomegaly.  EXTREMITIES:  No edema.   LABORATORY DATA:  Hemoglobin 10.4, white count 4.5, platelet count 367,000.  Sodium 135, potassium 4.1, BUN  6, creatinine 0.7, glucose 163, calcium 8.8.  Preoperative labs showed hemoglobin 11.4, white count 7.1, platelet count  389,000.  PT 13.2, PTT 34.  Sodium 139, potassium 4.2, BUN 11, creatinine  0.7, glucose 108, total bilirubin 0.6, alkaline phos 76, SGOT 19, SGPT 11,  total protein 6.9, albumin 3.1, calcium 9.1.   IMPRESSION AND PLAN:  1.  Newly diagnosed low grade B-cell non-Hodgkins lymphoma involving the      lungs status post right VATS with right upper lobe and right middle lobe      wedge resections.  2.  Small bilateral lung nodules.  3.  Mild anemia.  4.  Crohn's disease.  5.  Hypertension.  6.  Diabetes mellitus type 2.  7.  Asthma.  8.  GERD.   [name] has been diagnosed with a low grade non-Hodgkins lymphoma  involving the lungs.  She appears to have additional involvement of  bilateral lung nodules.  [name] does not have symptoms related to the  lymphoma and the lymphoma appears to be behaving in an indolent fashion.  There could be an association with immunosuppressive therapy for the  Crohn's.  We will decide on observation versus systemic therapy after  completing a staging evaluation.   RECOMMENDATIONS:  1.  CT abdomen and pelvis.  2.  SPEP/beta 2 myoglobulin/LDH.  3.  Outpatient follow up at the [facility] in the next 2-3 weeks.   The patient was interviewed and examined by Dr. Benay Spice; plan reviewed.      L   LT/MEDQ  D:  05/18/2005  T:  05/18/2005  Job:  [id number]   cc:   Dwaine Deter, M.D.  [address]  [place] [address]  Fax: [phone]   Earle Gell, M.D.  [address]  [place]  [place] [address]  Fax: [phone]   Asencion Noble, M.D. [facility]   Remo Lipps C. Roxan Hockey, M.D.  [address]  [place]  [place] [address]

## 2011-05-05 ENCOUNTER — Encounter (HOSPITAL_COMMUNITY): Payer: Medicare Other | Attending: Gastroenterology

## 2011-05-05 DIAGNOSIS — I10 Essential (primary) hypertension: Secondary | ICD-10-CM | POA: Insufficient documentation

## 2011-05-05 DIAGNOSIS — K5090 Crohn's disease, unspecified, without complications: Secondary | ICD-10-CM | POA: Insufficient documentation

## 2011-05-05 DIAGNOSIS — K219 Gastro-esophageal reflux disease without esophagitis: Secondary | ICD-10-CM | POA: Insufficient documentation

## 2011-05-05 DIAGNOSIS — J309 Allergic rhinitis, unspecified: Secondary | ICD-10-CM | POA: Insufficient documentation

## 2011-05-05 DIAGNOSIS — E119 Type 2 diabetes mellitus without complications: Secondary | ICD-10-CM | POA: Insufficient documentation

## 2011-05-05 DIAGNOSIS — J45909 Unspecified asthma, uncomplicated: Secondary | ICD-10-CM | POA: Insufficient documentation

## 2011-05-05 DIAGNOSIS — C8589 Other specified types of non-Hodgkin lymphoma, extranodal and solid organ sites: Secondary | ICD-10-CM | POA: Insufficient documentation

## 2011-06-23 ENCOUNTER — Ambulatory Visit (HOSPITAL_COMMUNITY)
Admission: RE | Admit: 2011-06-23 | Discharge: 2011-06-23 | Disposition: A | Discharge status: Home / Self Care | Payer: Medicare Other | Source: Ambulatory Visit | Attending: Oncology | Admitting: Oncology

## 2011-06-23 DIAGNOSIS — C8590 Non-Hodgkin lymphoma, unspecified, unspecified site: Secondary | ICD-10-CM

## 2011-06-23 DIAGNOSIS — C8589 Other specified types of non-Hodgkin lymphoma, extranodal and solid organ sites: Secondary | ICD-10-CM | POA: Insufficient documentation

## 2011-06-23 DIAGNOSIS — R0789 Other chest pain: Secondary | ICD-10-CM | POA: Insufficient documentation

## 2011-06-23 DIAGNOSIS — J984 Other disorders of lung: Secondary | ICD-10-CM | POA: Insufficient documentation

## 2011-06-25 ENCOUNTER — Encounter (HOSPITAL_BASED_OUTPATIENT_CLINIC_OR_DEPARTMENT_OTHER): Payer: Medicare Other | Admitting: Oncology

## 2011-06-25 ENCOUNTER — Other Ambulatory Visit: Payer: Self-pay | Admitting: Oncology

## 2011-06-25 DIAGNOSIS — C8589 Other specified types of non-Hodgkin lymphoma, extranodal and solid organ sites: Secondary | ICD-10-CM

## 2011-06-25 DIAGNOSIS — D638 Anemia in other chronic diseases classified elsewhere: Secondary | ICD-10-CM

## 2011-06-25 LAB — CBC WITH DIFFERENTIAL/PLATELET
HCT: 34.3 % — ABNORMAL LOW (ref 34.8–46.6)
MCHC: 33.8 g/dL (ref 31.5–36.0)
MCV: 95.9 fL (ref 79.5–101.0)
MONO#: 0.5 10*3/uL (ref 0.1–0.9)
MONO%: 10.9 % (ref 0.0–14.0)
NEUT%: 42.6 % (ref 38.4–76.8)
RBC: 3.58 10*6/uL — ABNORMAL LOW (ref 3.70–5.45)
RDW: 14.5 % (ref 11.2–14.5)
lymph#: 1.9 10*3/uL (ref 0.9–3.3)

## 2011-06-29 LAB — PROTEIN ELECTROPHORESIS, SERUM
Beta 2: 6.8 % — ABNORMAL HIGH (ref 3.2–6.5)
Beta Globulin: 6 % (ref 4.7–7.2)
Gamma Globulin: 20.4 % — ABNORMAL HIGH (ref 11.1–18.8)
Total Protein, Serum Electrophoresis: 7 g/dL (ref 6.0–8.3)

## 2011-07-23 ENCOUNTER — Ambulatory Visit (HOSPITAL_COMMUNITY)
Admission: RE | Admit: 2011-07-23 | Discharge: 2011-07-23 | Disposition: A | Discharge status: Home / Self Care | Payer: Medicare Other | Source: Ambulatory Visit | Attending: Gastroenterology | Admitting: Gastroenterology

## 2011-07-23 DIAGNOSIS — I10 Essential (primary) hypertension: Secondary | ICD-10-CM | POA: Insufficient documentation

## 2011-07-23 DIAGNOSIS — C8589 Other specified types of non-Hodgkin lymphoma, extranodal and solid organ sites: Secondary | ICD-10-CM | POA: Insufficient documentation

## 2011-07-23 DIAGNOSIS — K921 Melena: Secondary | ICD-10-CM | POA: Insufficient documentation

## 2011-07-23 DIAGNOSIS — E119 Type 2 diabetes mellitus without complications: Secondary | ICD-10-CM | POA: Insufficient documentation

## 2011-07-23 DIAGNOSIS — K5010 Crohn's disease of large intestine without complications: Secondary | ICD-10-CM | POA: Insufficient documentation

## 2011-07-23 DIAGNOSIS — R197 Diarrhea, unspecified: Secondary | ICD-10-CM | POA: Insufficient documentation

## 2011-07-23 DIAGNOSIS — E7800 Pure hypercholesterolemia, unspecified: Secondary | ICD-10-CM | POA: Insufficient documentation

## 2011-07-23 DIAGNOSIS — G4730 Sleep apnea, unspecified: Secondary | ICD-10-CM | POA: Insufficient documentation

## 2011-07-23 DIAGNOSIS — K219 Gastro-esophageal reflux disease without esophagitis: Secondary | ICD-10-CM | POA: Insufficient documentation

## 2011-07-30 ENCOUNTER — Encounter (HOSPITAL_COMMUNITY): Payer: Medicare Other | Attending: Gastroenterology

## 2011-07-30 DIAGNOSIS — I10 Essential (primary) hypertension: Secondary | ICD-10-CM | POA: Insufficient documentation

## 2011-07-30 DIAGNOSIS — C8589 Other specified types of non-Hodgkin lymphoma, extranodal and solid organ sites: Secondary | ICD-10-CM | POA: Insufficient documentation

## 2011-07-30 DIAGNOSIS — J45909 Unspecified asthma, uncomplicated: Secondary | ICD-10-CM | POA: Insufficient documentation

## 2011-07-30 DIAGNOSIS — J309 Allergic rhinitis, unspecified: Secondary | ICD-10-CM | POA: Insufficient documentation

## 2011-07-30 DIAGNOSIS — E119 Type 2 diabetes mellitus without complications: Secondary | ICD-10-CM | POA: Insufficient documentation

## 2011-07-30 DIAGNOSIS — K219 Gastro-esophageal reflux disease without esophagitis: Secondary | ICD-10-CM | POA: Insufficient documentation

## 2011-07-30 DIAGNOSIS — K5090 Crohn's disease, unspecified, without complications: Secondary | ICD-10-CM | POA: Insufficient documentation

## 2011-08-02 NOTE — Op Note (Signed)
  NAMEBELITA, [name] NO.:  [id number]  MEDICAL RECORD NO.:  [id number]  LOCATION:  WLEN                         FACILITY:  [facility]  PHYSICIAN:  Earle Gell, M.D.   DATE OF BIRTH:  December 05, 1950  DATE OF PROCEDURE:  07/23/2011 DATE OF DISCHARGE:                              OPERATIVE REPORT   REFERRING PHYSICIAN:  Henrine Screws, M.D.  HISTORY:  [name] is a 60 year old female born on 10-17-1951.  The patient was diagnosed with universal Crohn's colitis by colonoscopy in 2004.  Her terminal ileum appeared normal on barium upper GI with small-bowel follow-through x-ray series.  The patient was started on azathioprine in 2004.  In 2006, the patient was diagnosed with non-Hodgkin lymphoma of the right lung.  Her oncologist is Dr. Julieanne Manson.  In January 2007, the patient was hospitalized with a flare in her Crohn's proctocolitis.  She underwent an unprepped proctocolonoscopy to the cecum, which revealed active Crohn's colitis involving the sigmoid colon and descending colon.  The patient has remained on daily azathioprine therapy without complications.  She has received intermittent Remicade infusions to treat symptomatic flare-ups in her Crohn's colitis.  The patient has chronic type 2 diabetes mellitus and prefers not to be on prednisone therapy.  The patient is experiencing diarrhea and small-volume hematochezia.  She is scheduled to undergo a diagnostic flexible proctosigmoidoscopy.  ENDOSCOPIST:  Earle Gell, M.D.  PREMEDICATION: 1. Fentanyl 75 mcg. 2. Versed 7 mg.  PROCEDURE:  Anal inspection and digital rectal exam were normal.  The Pentax pediatric colonoscope was introduced into the rectum and advanced to approximately 80 cm from the anal verge.  There is moderate Crohn's colitis involving the rectum.  There is severe Crohn's colitis involving the sigmoid colon associated with extremely friable and edematous mucosa.   The mucosa starting in the descending colon appeared normal and extended to 80 cm from the anal verge.  There was a large amount of solid stool in the descending colon.  ASSESSMENT:  Active Crohn's proctosigmoiditis.  PLAN:  I will schedule the patient for Remicade infusion.          ______________________________ Earle Gell, M.D.     MJ/MEDQ  D:  07/23/2011  T:  07/23/2011  Job:  [id number]  Electronically Signed by Earle Gell M.D. on 08/02/2011 09:13:19 AM

## 2011-09-16 ENCOUNTER — Other Ambulatory Visit (HOSPITAL_COMMUNITY): Payer: Self-pay | Admitting: Obstetrics & Gynecology

## 2011-09-16 DIAGNOSIS — Z1231 Encounter for screening mammogram for malignant neoplasm of breast: Secondary | ICD-10-CM

## 2011-09-23 ENCOUNTER — Other Ambulatory Visit: Payer: Self-pay | Admitting: Critical Care Medicine

## 2011-09-24 ENCOUNTER — Telehealth: Payer: Self-pay | Admitting: Critical Care Medicine

## 2011-09-24 ENCOUNTER — Encounter: Payer: Self-pay | Admitting: Pulmonary Disease

## 2011-09-24 ENCOUNTER — Encounter (HOSPITAL_COMMUNITY): Payer: Medicare Other | Attending: Gastroenterology

## 2011-09-24 DIAGNOSIS — K5090 Crohn's disease, unspecified, without complications: Secondary | ICD-10-CM | POA: Insufficient documentation

## 2011-09-24 DIAGNOSIS — J45909 Unspecified asthma, uncomplicated: Secondary | ICD-10-CM | POA: Insufficient documentation

## 2011-09-24 DIAGNOSIS — K219 Gastro-esophageal reflux disease without esophagitis: Secondary | ICD-10-CM | POA: Insufficient documentation

## 2011-09-24 DIAGNOSIS — I10 Essential (primary) hypertension: Secondary | ICD-10-CM | POA: Insufficient documentation

## 2011-09-24 DIAGNOSIS — J309 Allergic rhinitis, unspecified: Secondary | ICD-10-CM | POA: Insufficient documentation

## 2011-09-24 DIAGNOSIS — E119 Type 2 diabetes mellitus without complications: Secondary | ICD-10-CM | POA: Insufficient documentation

## 2011-09-24 DIAGNOSIS — C8589 Other specified types of non-Hodgkin lymphoma, extranodal and solid organ sites: Secondary | ICD-10-CM | POA: Insufficient documentation

## 2011-09-24 NOTE — Telephone Encounter (Signed)
Scheduled pt to see pw tomorrow 11/2 at 9:30.

## 2011-09-24 NOTE — Telephone Encounter (Signed)
Crystal had called pt because pt requested advair refill but pt has not been seen since 07/2010, socrystal was calling to make pt ov--scheduled pt to see pw tomorrow 11/2 at 9:30 for med refills--pt aware of appt

## 2011-09-24 NOTE — Telephone Encounter (Signed)
Pt was last seen by Dr. Joya Gaskins on 07/29/2010 and has no pending appts.  I called pt's home # - [phone] - [email or web address] and called cell # - [phone] - [email or web address].

## 2011-09-25 ENCOUNTER — Encounter: Payer: Self-pay | Admitting: Critical Care Medicine

## 2011-09-25 ENCOUNTER — Ambulatory Visit (INDEPENDENT_AMBULATORY_CARE_PROVIDER_SITE_OTHER): Payer: Medicare Other | Admitting: Critical Care Medicine

## 2011-09-25 VITALS — BP 110/78 | HR 73 | Temp 98.1°F | Ht 64.0 in | Wt 223.4 lb

## 2011-09-25 DIAGNOSIS — M5432 Sciatica, left side: Secondary | ICD-10-CM | POA: Insufficient documentation

## 2011-09-25 DIAGNOSIS — J45909 Unspecified asthma, uncomplicated: Secondary | ICD-10-CM

## 2011-09-25 DIAGNOSIS — M5430 Sciatica, unspecified side: Secondary | ICD-10-CM

## 2011-09-25 DIAGNOSIS — M5431 Sciatica, right side: Secondary | ICD-10-CM

## 2011-09-25 DIAGNOSIS — Z23 Encounter for immunization: Secondary | ICD-10-CM

## 2011-09-25 MED ORDER — MONTELUKAST SODIUM 10 MG PO TABS: 10.0000 mg | ORAL_TABLET | Freq: Every day | ORAL | Status: DC

## 2011-09-25 MED ORDER — FLUTICASONE-SALMETEROL 100-50 MCG/DOSE IN AEPB: 1.0000 | INHALATION_SPRAY | Freq: Two times a day (BID) | RESPIRATORY_TRACT | Status: DC

## 2011-09-25 NOTE — Patient Instructions (Signed)
Flu vaccine today Refills on advair and singulair sent to pharmacy, 59month at a time Return 12 months or sooner as needed

## 2011-09-25 NOTE — Assessment & Plan Note (Signed)
Moderate persistent asthma stable at this time Plan Maintain Advair and Singulair as prescribed Return 1 year

## 2011-09-25 NOTE — Progress Notes (Signed)
Subjective:    Patient ID: [name], female    DOB: Apr 04, 1951, 60 y.o.   MRN: [id number]  HPI 60 y.o.  African-American female with moderate persistent asthma & obstructive sleep apnea .   09/25/2011 1year f/u.  No change in cough or breathing.  Lymphoma being watched.  On Cpap every night.   Asthma History: Symptoms 0-2 days/week Nighttime Awakenings 0-2/month Asthma interference with normal activity No limitations SABA use (not for EIB) 0-2 days/wk Risk: Exacerbations requiring oral systemic steroids 0-1 / year  Past Medical History  Diagnosis Date  . Allergic rhinitis   . Asthma   . Hypertension   . Small cell B-cell lymphoma     R supra hilar mass- Needle Bx positive 2006  . Crohn's disease   . HTN (hypertension)   . Chronic anemia   . Type 2 diabetes mellitus      Family History  Problem Relation Age of Onset  . Diabetes Mother   . Heart attack Father   . Liver cancer Mother      History   Social History  . Marital Status: Married    Spouse Name: N/A    Number of Children: 2  . Years of Education: N/A   Occupational History  . disabled    Social History Main Topics  . Smoking status: Never Smoker   . Smokeless tobacco: Never Used  . Alcohol Use: Not on file  . Drug Use: Not on file  . Sexually Active: Not on file   Other Topics Concern  . Not on file   Social History Narrative  . No narrative on file     Not on File   Outpatient Prescriptions Prior to Visit  Medication Sig Dispense Refill  . amitriptyline (ELAVIL) 50 MG tablet Take 50 mg by mouth daily.        [name] azaTHIOprine (IMURAN) 50 MG tablet Take 50 mg by mouth 3 (three) times daily.        . Calcium Carbonate-Vitamin D (CALCIUM 600 + D PO) Take by mouth. 1 by mouth daily       . estrogens, conjugated, (PREMARIN) 0.625 MG tablet Take 0.625 mg by mouth daily. Take daily for 21 days then do not take for 7 days.       . Fluticasone-Salmeterol (ADVAIR DISKUS) 100-50 MCG/DOSE AEPB Inhale 1  puff into the lungs 2 (two) times daily.        . Multiple Vitamin (MULTIVITAMIN) capsule Take 1 capsule by mouth daily.        [name] olmesartan-hydrochlorothiazide (BENICAR HCT) 20-12.5 MG per tablet Take 1 tablet by mouth daily.        . pioglitazone (ACTOS) 30 MG tablet Take 30 mg by mouth daily.        . sertraline (ZOLOFT) 100 MG tablet Take 100 mg by mouth daily.        . vitamin D, CHOLECALCIFEROL, 400 UNITS tablet Take 400 Units by mouth daily.        . montelukast (SINGULAIR) 10 MG tablet Take 10 mg by mouth daily.            Review of Systems Constitutional:   No  weight loss, night sweats,  Fevers, chills, fatigue, lassitude. HEENT:   No headaches,  Difficulty swallowing,  Tooth/dental problems,  Sore throat,                No sneezing, itching, ear ache, nasal congestion, post nasal drip,   CV:  No chest pain,  Orthopnea, PND, swelling in lower extremities, anasarca, dizziness, palpitations  GI  No heartburn, indigestion, abdominal pain, nausea, vomiting, diarrhea, change in bowel habits, loss of appetite  Resp: No shortness of breath with exertion or at rest.  No excess mucus, no productive cough,  No non-productive cough,  No coughing up of blood.  No change in color of mucus.  No wheezing.  No chest wall deformity  Skin: no rash or lesions.  GU: no dysuria, change in color of urine, no urgency or frequency.  No flank pain.  MS:  No joint pain or swelling.  No decreased range of motion.  No back pain.  Psych:  No change in mood or affect. No depression or anxiety.  No memory loss.     Objective:   Physical Exam Filed Vitals:   09/25/11 0933  BP: 110/78  Pulse: 73  Temp: 98.1 F (36.7 C)  TempSrc: Oral  Height: 5' 4"  (1.626 m)  Weight: 223 lb 6.4 oz (101.334 kg)  SpO2: 97%    Gen: Pleasant, well-nourished, in no distress,  normal affect  ENT: No lesions,  mouth clear,  oropharynx clear, no postnasal drip  Neck: No JVD, no TMG, no carotid bruits  Lungs: No  use of accessory muscles, no dullness to percussion, clear without rales or rhonchi  Cardiovascular: RRR, heart sounds normal, no murmur or gallops, no peripheral edema  Abdomen: soft and NT, no HSM,  BS normal  Musculoskeletal: No deformities, no cyanosis or clubbing  Neuro: alert, non focal  Skin: Warm, no lesions or rashes       Assessment & Plan:   ASTHMA Moderate persistent asthma stable at this time Plan Maintain Advair and Singulair as prescribed Return 1 year    Updated Medication List Outpatient Encounter Prescriptions as of 09/25/2011  Medication Sig Dispense Refill  . amitriptyline (ELAVIL) 50 MG tablet Take 50 mg by mouth daily.        [name] aspirin 81 MG tablet Take 81 mg by mouth daily.        [name] azaTHIOprine (IMURAN) 50 MG tablet Take 50 mg by mouth 3 (three) times daily.        . Calcium Carbonate-Vitamin D (CALCIUM 600 + D PO) Take by mouth. 1 by mouth daily       . estrogens, conjugated, (PREMARIN) 0.625 MG tablet Take 0.625 mg by mouth daily. Take daily for 21 days then do not take for 7 days.       . Fluticasone-Salmeterol (ADVAIR DISKUS) 100-50 MCG/DOSE AEPB Inhale 1 puff into the lungs 2 (two) times daily.  180 each  4  . Multiple Vitamin (MULTIVITAMIN) capsule Take 1 capsule by mouth daily.        [name] olmesartan-hydrochlorothiazide (BENICAR HCT) 20-12.5 MG per tablet Take 1 tablet by mouth daily.        . pioglitazone (ACTOS) 30 MG tablet Take 30 mg by mouth daily.        . sertraline (ZOLOFT) 100 MG tablet Take 100 mg by mouth daily.        . vitamin D, CHOLECALCIFEROL, 400 UNITS tablet Take 400 Units by mouth daily.        [name] DISCONTD: Fluticasone-Salmeterol (ADVAIR DISKUS) 100-50 MCG/DOSE AEPB Inhale 1 puff into the lungs 2 (two) times daily.        . montelukast (SINGULAIR) 10 MG tablet Take 1 tablet (10 mg total) by mouth daily.  90 tablet  4  . DISCONTD:  montelukast (SINGULAIR) 10 MG tablet Take 10 mg by mouth daily.

## 2011-10-12 ENCOUNTER — Ambulatory Visit (HOSPITAL_COMMUNITY)
Admission: RE | Admit: 2011-10-12 | Discharge: 2011-10-12 | Disposition: A | Discharge status: Home / Self Care | Payer: Medicare Other | Source: Ambulatory Visit | Attending: Obstetrics & Gynecology | Admitting: Obstetrics & Gynecology

## 2011-10-12 DIAGNOSIS — Z1231 Encounter for screening mammogram for malignant neoplasm of breast: Secondary | ICD-10-CM | POA: Insufficient documentation

## 2011-11-19 ENCOUNTER — Other Ambulatory Visit: Payer: Self-pay | Admitting: Critical Care Medicine

## 2011-11-19 ENCOUNTER — Encounter (HOSPITAL_COMMUNITY): Payer: Medicare Other

## 2011-12-05 ENCOUNTER — Telehealth: Payer: Self-pay | Admitting: Oncology

## 2011-12-05 NOTE — Telephone Encounter (Signed)
appt made for 2/18,l/m with info and mailed sch   aom

## 2012-01-08 ENCOUNTER — Other Ambulatory Visit: Payer: Self-pay | Admitting: *Deleted

## 2012-01-08 DIAGNOSIS — C8582 Other specified types of non-Hodgkin lymphoma, intrathoracic lymph nodes: Secondary | ICD-10-CM

## 2012-01-11 ENCOUNTER — Other Ambulatory Visit (HOSPITAL_BASED_OUTPATIENT_CLINIC_OR_DEPARTMENT_OTHER): Payer: Medicare Other | Admitting: Lab

## 2012-01-11 ENCOUNTER — Ambulatory Visit (HOSPITAL_BASED_OUTPATIENT_CLINIC_OR_DEPARTMENT_OTHER): Payer: Medicare Other | Admitting: Oncology

## 2012-01-11 ENCOUNTER — Telehealth: Payer: Self-pay | Admitting: Oncology

## 2012-01-11 VITALS — BP 130/79 | HR 99 | Temp 98.1°F | Ht 64.0 in | Wt 222.4 lb

## 2012-01-11 DIAGNOSIS — D638 Anemia in other chronic diseases classified elsewhere: Secondary | ICD-10-CM

## 2012-01-11 DIAGNOSIS — C8582 Other specified types of non-Hodgkin lymphoma, intrathoracic lymph nodes: Secondary | ICD-10-CM

## 2012-01-11 LAB — CBC WITH DIFFERENTIAL/PLATELET
BASO%: 0.5 % (ref 0.0–2.0)
EOS%: 5.1 % (ref 0.0–7.0)
HCT: 34 % — ABNORMAL LOW (ref 34.8–46.6)
MCH: 32.8 pg (ref 25.1–34.0)
MCHC: 34.2 g/dL (ref 31.5–36.0)
MCV: 95.8 fL (ref 79.5–101.0)
MONO%: 7.9 % (ref 0.0–14.0)
NEUT%: 63.3 % (ref 38.4–76.8)
lymph#: 1.3 10*3/uL (ref 0.9–3.3)

## 2012-01-11 NOTE — Telephone Encounter (Signed)
appt made and printed for 8/18 aom

## 2012-01-11 NOTE — Progress Notes (Signed)
OFFICE PROGRESS NOTE   INTERVAL HISTORY:   She returns as scheduled. She reports the Crohn's is in active. She denies bleeding. She denies fever, anorexia, and night sweats. She has a sinus drainage and congestion at present with a cough. No dyspnea. The post thoracotomy pain is partially relieved with Elavil.  Objective:  Vital signs in last 24 hours:  Blood pressure 130/79, pulse 99, temperature 98.1 F (36.7 C), temperature source Oral, height 5' 4"  (1.626 m), weight 222 lb 6.4 oz (100.88 kg).    HEENT: Neck without mass Lymphatics: No cervical, supraclavicular, axillary, or inguinal nodes Resp: End inspiratory rhonchi and wheeze at the left greater than right upper posterior chest. No respiratory distress. Good air movement bilaterally. Cardio: Regular rate and rhythm GI: No hepatosplenomegaly, nontender Vascular: The left lower leg is slightly larger than the right side, no edema  Skin: Keloids at the right posterior lateral chest wall surgical site   Lab Results:  Lab Results  Component Value Date   WBC 5.8 01/11/2012   HGB 11.6 01/11/2012   HCT 34.0* 01/11/2012   MCV 95.8 01/11/2012   PLT 373 01/11/2012   ANC 3 point    Medications: I have reviewed the patient's current medications.  Assessment/Plan: 1. Non-Hodgkin lymphoma, low grade lymphoma involving the lungs, diagnosed in June 2006.  No clinical evidence of progression. 2. Crohn disease, followed by Dr. Wynetta Emery.  She continues to receive intermittent Remicade therapy.  She is taking Imuran. 3. Post thoracotomy pain, improved with Elavil.  4. Anemia secondary to chronic disease and gastrointestinal bleeding, stable. 5. History of thrombocytosis.  The platelet count is normal today. 6. History of anorexia/weight loss in the setting of active Crohn disease. 7. History of a serum monoclonal protein, stable on repeat serum protein electrophoresis studies, last in February 2012. We obtained a repeat serum protein  electrophoresis today. 8. History of asthma -exacerbated by the current upper respiratory infection.   Disposition:  She remains in clinical remission from the non-Hodgkin's lymphoma. She will seek medical attention if the respiratory symptoms do not resolve. She will return for an office and lab visit in 6 months.   Electa Sniff, MD  01/11/2012  9:27 AM

## 2012-01-13 LAB — PROTEIN ELECTROPHORESIS, SERUM
Alpha-1-Globulin: 4.9 % (ref 2.9–4.9)
Beta 2: 7 % — ABNORMAL HIGH (ref 3.2–6.5)
Gamma Globulin: 19.6 % — ABNORMAL HIGH (ref 11.1–18.8)
M-Spike, %: 0.4 g/dL

## 2012-02-02 ENCOUNTER — Other Ambulatory Visit: Payer: Self-pay | Admitting: *Deleted

## 2012-02-02 DIAGNOSIS — C8589 Other specified types of non-Hodgkin lymphoma, extranodal and solid organ sites: Secondary | ICD-10-CM

## 2012-02-02 MED ORDER — AMITRIPTYLINE HCL 50 MG PO TABS: 50.0000 mg | ORAL_TABLET | Freq: Every day | ORAL | Status: DC

## 2012-07-11 ENCOUNTER — Ambulatory Visit (HOSPITAL_BASED_OUTPATIENT_CLINIC_OR_DEPARTMENT_OTHER): Payer: Medicare Other | Admitting: Oncology

## 2012-07-11 ENCOUNTER — Other Ambulatory Visit (HOSPITAL_BASED_OUTPATIENT_CLINIC_OR_DEPARTMENT_OTHER): Payer: Medicare Other | Admitting: Lab

## 2012-07-11 ENCOUNTER — Telehealth: Payer: Self-pay | Admitting: Oncology

## 2012-07-11 VITALS — BP 125/73 | HR 84 | Temp 97.2°F | Resp 20 | Ht 64.0 in | Wt 217.1 lb

## 2012-07-11 DIAGNOSIS — K5090 Crohn's disease, unspecified, without complications: Secondary | ICD-10-CM

## 2012-07-11 DIAGNOSIS — C8582 Other specified types of non-Hodgkin lymphoma, intrathoracic lymph nodes: Secondary | ICD-10-CM

## 2012-07-11 LAB — CBC WITH DIFFERENTIAL/PLATELET
BASO%: 0.5 % (ref 0.0–2.0)
EOS%: 9.2 % — ABNORMAL HIGH (ref 0.0–7.0)
MCH: 30.8 pg (ref 25.1–34.0)
MCHC: 32.2 g/dL (ref 31.5–36.0)
MONO#: 0.6 10*3/uL (ref 0.1–0.9)
RBC: 3.64 10*6/uL — ABNORMAL LOW (ref 3.70–5.45)
RDW: 13.7 % (ref 11.2–14.5)
WBC: 6.1 10*3/uL (ref 3.9–10.3)
lymph#: 1.7 10*3/uL (ref 0.9–3.3)

## 2012-07-11 NOTE — Telephone Encounter (Signed)
gve the pt her feb 2014 appt calendar

## 2012-07-11 NOTE — Progress Notes (Signed)
   [place]    OFFICE PROGRESS NOTE   INTERVAL HISTORY:   She returns as scheduled. Minimal rectal bleeding and diarrhea at present. She has not received Remicade recently. No fever, night sweats, or shortness of breath. She has an intermittent cough and wheezing. She reports being placed on an Advair inhaler by Dr. [name] in June.  Objective:  Vital signs in last 24 hours:  Blood pressure 125/73, pulse 84, temperature 97.2 F (36.2 C), temperature source Oral, resp. rate 20, height 5' 4"  (1.626 m), weight 217 lb 1.6 oz (98.476 kg).    HEENT: Neck without mass Lymphatics: No cervical, supraclavicular, axillary, or inguinal nodes. Soft mobile 1-2 cm fullness in the left supraclavicular fossa that moves Resp: Scattered and inspiratory wheezes, good air movement bilaterally, no respiratory distress Cardio: Regular rate and rhythm GI: Nontender, no hepatosplenomegaly Vascular: No leg edema, the left lower leg is slightly larger than the right side   Lab Results:  Lab Results  Component Value Date   WBC 6.1 07/11/2012   HGB 11.2* 07/11/2012   HCT 34.8 07/11/2012   MCV 95.6 07/11/2012   PLT 548* 07/11/2012   ANC 3.2 Serum M spike 0.4 on 01/11/2012 Serum M spike 0.45 on 06/25/2011 and 0.42 on 12/25/2010   Medications: I have reviewed the patient's current medications.  Assessment/Plan: 1. Non-Hodgkin lymphoma, low grade lymphoma involving the lungs, diagnosed in June 2006. No clinical evidence of progression. 2. Crohn disease, followed by Dr. Wynetta Emery. She continues to receive intermittent Remicade therapy. She is taking Imuran. 3. Post thoracotomy pain, improved with Elavil.  4. Anemia secondary to chronic disease and gastrointestinal bleeding, stable. 5. History of thrombocytosis. 6. History of anorexia/weight loss in the setting of active Crohn disease. 7. History of a serum monoclonal protein, stable on repeat serum protein electrophoresis studies, last in  February 2013. We will obtain a repeat serum protein electrophoresis when she returns in 6 months. 8. History of asthma -she reports an exacerbation in June, now taking Advair  Disposition:  She remains in clinical remission from the non-Hodgkin's lipoma. She will return for an office and lab visit in 6 months. She will contact us in the interim for new symptoms.   [name], MD  07/11/2012  1:16 PM

## 2012-08-15 ENCOUNTER — Other Ambulatory Visit (HOSPITAL_COMMUNITY): Payer: Self-pay | Admitting: *Deleted

## 2012-08-16 ENCOUNTER — Encounter (HOSPITAL_COMMUNITY): Payer: Self-pay

## 2012-08-16 ENCOUNTER — Encounter (HOSPITAL_COMMUNITY)
Admission: RE | Admit: 2012-08-16 | Discharge: 2012-08-16 | Disposition: A | Discharge status: Home / Self Care | Payer: Medicare Other | Source: Ambulatory Visit | Attending: Gastroenterology | Admitting: Gastroenterology

## 2012-08-16 DIAGNOSIS — K5090 Crohn's disease, unspecified, without complications: Secondary | ICD-10-CM | POA: Insufficient documentation

## 2012-08-16 MED ORDER — METHYLPREDNISOLONE SODIUM SUCC 40 MG IJ SOLR
20.0000 mg | Freq: Once | INTRAMUSCULAR | Status: AC
  Administered 2012-08-16: 20 mg via INTRAVENOUS
  Filled 2012-08-16: qty 1

## 2012-08-16 MED ORDER — SODIUM CHLORIDE 0.9 % IV SOLN
INTRAVENOUS | Status: DC
  Administered 2012-08-16: 250 mL via INTRAVENOUS

## 2012-08-16 MED ORDER — ACETAMINOPHEN 325 MG PO TABS
650.0000 mg | ORAL_TABLET | Freq: Once | ORAL | Status: AC
  Administered 2012-08-16: 650 mg via ORAL
  Filled 2012-08-16: qty 2

## 2012-08-16 MED ORDER — SODIUM CHLORIDE 0.9 % IV SOLN
400.0000 mg | Freq: Once | INTRAVENOUS | Status: AC
  Administered 2012-08-16: 400 mg via INTRAVENOUS
  Filled 2012-08-16: qty 40

## 2012-08-16 MED ORDER — METHYLPREDNISOLONE SODIUM SUCC 125 MG IJ SOLR
20.0000 mg | Freq: Once | INTRAMUSCULAR | Status: DC
  Filled 2012-08-16: qty 0.32

## 2012-08-16 NOTE — Progress Notes (Signed)
Pt states she is not to have a 2nd appointment scheduled at this time, she states she is to see md in 2 weeks and they will decide if she is to continue

## 2012-08-30 ENCOUNTER — Other Ambulatory Visit: Payer: Self-pay | Admitting: *Deleted

## 2012-08-30 DIAGNOSIS — C8589 Other specified types of non-Hodgkin lymphoma, extranodal and solid organ sites: Secondary | ICD-10-CM

## 2012-08-30 MED ORDER — AMITRIPTYLINE HCL 50 MG PO TABS: 50.0000 mg | ORAL_TABLET | Freq: Every day | ORAL | Status: DC

## 2012-09-19 ENCOUNTER — Other Ambulatory Visit (HOSPITAL_COMMUNITY): Payer: Self-pay | Admitting: Obstetrics & Gynecology

## 2012-09-19 DIAGNOSIS — Z1231 Encounter for screening mammogram for malignant neoplasm of breast: Secondary | ICD-10-CM

## 2012-10-01 ENCOUNTER — Encounter (HOSPITAL_COMMUNITY): Payer: Self-pay | Admitting: Emergency Medicine

## 2012-10-01 ENCOUNTER — Emergency Department (HOSPITAL_COMMUNITY)
Admission: EM | Admit: 2012-10-01 | Discharge: 2012-10-01 | Disposition: A | Discharge status: Home / Self Care | Payer: Medicare Other | Attending: Emergency Medicine | Admitting: Emergency Medicine

## 2012-10-01 DIAGNOSIS — Z87898 Personal history of other specified conditions: Secondary | ICD-10-CM | POA: Insufficient documentation

## 2012-10-01 DIAGNOSIS — Z8709 Personal history of other diseases of the respiratory system: Secondary | ICD-10-CM | POA: Insufficient documentation

## 2012-10-01 DIAGNOSIS — Z7982 Long term (current) use of aspirin: Secondary | ICD-10-CM | POA: Insufficient documentation

## 2012-10-01 DIAGNOSIS — E119 Type 2 diabetes mellitus without complications: Secondary | ICD-10-CM | POA: Insufficient documentation

## 2012-10-01 DIAGNOSIS — K5090 Crohn's disease, unspecified, without complications: Secondary | ICD-10-CM | POA: Insufficient documentation

## 2012-10-01 DIAGNOSIS — Z8739 Personal history of other diseases of the musculoskeletal system and connective tissue: Secondary | ICD-10-CM | POA: Insufficient documentation

## 2012-10-01 DIAGNOSIS — Z79899 Other long term (current) drug therapy: Secondary | ICD-10-CM | POA: Insufficient documentation

## 2012-10-01 DIAGNOSIS — G4730 Sleep apnea, unspecified: Secondary | ICD-10-CM | POA: Insufficient documentation

## 2012-10-01 DIAGNOSIS — I10 Essential (primary) hypertension: Secondary | ICD-10-CM | POA: Insufficient documentation

## 2012-10-01 DIAGNOSIS — H81399 Other peripheral vertigo, unspecified ear: Secondary | ICD-10-CM | POA: Insufficient documentation

## 2012-10-01 DIAGNOSIS — J45909 Unspecified asthma, uncomplicated: Secondary | ICD-10-CM | POA: Insufficient documentation

## 2012-10-01 DIAGNOSIS — D649 Anemia, unspecified: Secondary | ICD-10-CM | POA: Insufficient documentation

## 2012-10-01 LAB — CBC WITH DIFFERENTIAL/PLATELET
Basophils Absolute: 0 10*3/uL (ref 0.0–0.1)
Eosinophils Absolute: 0.3 10*3/uL (ref 0.0–0.7)
Eosinophils Relative: 6 % — ABNORMAL HIGH (ref 0–5)
Lymphocytes Relative: 38 % (ref 12–46)
MCV: 96.7 fL (ref 78.0–100.0)
Platelets: 413 10*3/uL — ABNORMAL HIGH (ref 150–400)
RDW: 15.5 % (ref 11.5–15.5)
WBC: 4.9 10*3/uL (ref 4.0–10.5)

## 2012-10-01 LAB — COMPREHENSIVE METABOLIC PANEL
Albumin: 3.2 g/dL — ABNORMAL LOW (ref 3.5–5.2)
Alkaline Phosphatase: 65 U/L (ref 39–117)
BUN: 14 mg/dL (ref 6–23)
CO2: 27 mEq/L (ref 19–32)
Chloride: 98 mEq/L (ref 96–112)
GFR calc non Af Amer: 67 mL/min — ABNORMAL LOW (ref >90)
Glucose, Bld: 164 mg/dL — ABNORMAL HIGH (ref 70–99)
Potassium: 3.5 mEq/L (ref 3.5–5.1)
Total Bilirubin: 0.4 mg/dL (ref 0.3–1.2)

## 2012-10-01 LAB — URINALYSIS, ROUTINE W REFLEX MICROSCOPIC
Ketones, ur: NEGATIVE mg/dL
Nitrite: NEGATIVE
Protein, ur: NEGATIVE mg/dL

## 2012-10-01 MED ORDER — MECLIZINE HCL 25 MG PO TABS: 25.0000 mg | ORAL_TABLET | Freq: Three times a day (TID) | ORAL | Status: DC | PRN

## 2012-10-01 MED ORDER — SODIUM CHLORIDE 0.9 % IV BOLUS (SEPSIS)
1000.0000 mL | Freq: Once | INTRAVENOUS | Status: AC
  Administered 2012-10-01: 1000 mL via INTRAVENOUS

## 2012-10-01 NOTE — ED Notes (Signed)
Per EMS pt transported from home after experiencing several episodes of becoming dizzy with near syncope several times. Pt A & O. Denies pain, denies n/v/d.

## 2012-10-01 NOTE — ED Notes (Signed)
Pt. Attempted to give urine but unsuccessful. Pt. Made aware for the need of urine.

## 2012-10-01 NOTE — ED Provider Notes (Signed)
History     CSN: [id number]  Arrival date & time 10/01/12  0436   First MD Initiated Contact with Patient 10/01/12 [phone]      Chief Complaint  Patient presents with  . Near Syncope    (Consider location/radiation/quality/duration/timing/severity/associated sxs/prior treatment) HPI Patient presents to the emergency department with dizziness, and lightheadedness began at 12 AM.  Patient, states that when she got up she felt like the room was moving and she felt like she could pass out.  Patient denies chest pain, shortness of breath, nausea, vomiting, diarrhea, pain, back pain, fever, visual changes, or syncope.  Patient, states she got up again alert husband, and to the house and felt the same symptoms and that is what brought her to the hospital.  Patient, states she did not take anything prior to arrival, for any of her symptoms.  Patient, states it felt like the positional change, may make her symptoms worse. Past Medical History  Diagnosis Date  . Allergic rhinitis   . Asthma   . Hypertension   . Small cell b-cell lymphoma     R supra hilar mass- Needle Bx positive 2006  . Crohn's disease   . HTN (hypertension)   . Chronic anemia   . Type 2 diabetes mellitus   . Sleep apnea   . Bilateral sciatica     Past Surgical History  Procedure Date  . Vesicovaginal fistula closure w/ tah May 1988    Family History  Problem Relation Age of Onset  . Diabetes Mother   . Heart attack Father   . Liver cancer Mother     History  Substance Use Topics  . Smoking status: Never Smoker   . Smokeless tobacco: Never Used  . Alcohol Use: No    OB History    Grav Para Term Preterm Abortions TAB SAB Ect Mult Living                  Review of Systems All other systems negative except as documented in the HPI. All pertinent positives and negatives as reviewed in the HPI.  Allergies  Review of patient's allergies indicates no known allergies.  Home Medications   Current  Outpatient Rx  Name  Route  Sig  Dispense  Refill  . ALPRAZOLAM 0.25 MG PO TABS   Oral   Take 0.25 mg by mouth 3 (three) times daily as needed. For anxiety         . AMITRIPTYLINE HCL 50 MG PO TABS   Oral   Take 50 mg by mouth at bedtime.         . ASPIRIN EC 81 MG PO TBEC   Oral   Take 81 mg by mouth daily.         . AZATHIOPRINE 50 MG PO TABS   Oral   Take 50 mg by mouth 3 (three) times daily.           [name] CALCIUM 600 + D PO   Oral   Take 1 tablet by mouth daily.          . CHOLECALCIFEROL 1000 UNITS PO TABS   Oral   Take 1,000 Units by mouth daily.         [name] ESTROGENS CONJUGATED 0.625 MG PO TABS   Oral   Take 0.625 mg by mouth daily.          [name] FLUTICASONE-SALMETEROL 100-50 MCG/DOSE IN AEPB   Inhalation   Inhale 1 puff into the  lungs 2 (two) times daily.   180 each   4   . METFORMIN HCL 500 MG PO TABS   Oral   Take 500 mg by mouth 2 (two) times daily with a meal.         . MONTELUKAST SODIUM 10 MG PO TABS   Oral   Take 1 tablet (10 mg total) by mouth daily.   90 tablet   4     Generic please   . ADULT MULTIVITAMIN W/MINERALS CH   Oral   Take 1 tablet by mouth daily.         [name] OLMESARTAN MEDOXOMIL-HCTZ 20-12.5 MG PO TABS   Oral   Take 1 tablet by mouth daily.           [name] OMEPRAZOLE 20 MG PO CPDR      take 1 capsule by mouth once daily   30 capsule   11   . SERTRALINE HCL 100 MG PO TABS   Oral   Take 100 mg by mouth daily.             BP 146/79  Pulse 97  Temp 97.8 F (36.6 C) (Oral)  Resp 18  SpO2 98%  Physical Exam  Constitutional: She is oriented to person, place, and time. She appears well-developed and well-nourished.  HENT:  Head: Normocephalic and atraumatic.  Mouth/Throat: Oropharynx is clear and moist.  Eyes: Conjunctivae normal and EOM are normal. Pupils are equal, round, and reactive to light.  Neck: Normal range of motion. Neck supple.  Cardiovascular: Normal rate, regular rhythm and normal heart  sounds.  Exam reveals no gallop and no friction rub.   No murmur heard. Pulmonary/Chest: Effort normal and breath sounds normal. No respiratory distress.  Abdominal: Soft. Bowel sounds are normal. She exhibits no distension. There is no tenderness.  Neurological: She is alert and oriented to person, place, and time. She exhibits normal muscle tone. Coordination and gait normal.       Patient has normal fine motor skills with heel-to-shin testing and nose to finger testing  Skin: Skin is warm and dry. No rash noted.    ED Course  Procedures (including critical care time)  Labs Reviewed  COMPREHENSIVE METABOLIC PANEL - Abnormal; Notable for the following:    Glucose, Bld 164 (*)     Albumin 3.2 (*)     GFR calc non Af Amer 67 (*)     GFR calc Af Amer 77 (*)     All other components within normal limits  CBC WITH DIFFERENTIAL - Abnormal; Notable for the following:    RBC 3.63 (*)     Hemoglobin 11.5 (*)     HCT 35.1 (*)     Platelets 413 (*)     Eosinophils Relative 6 (*)     All other components within normal limits  URINALYSIS, ROUTINE W REFLEX MICROSCOPIC   Patient feels better here in the emergency department.  Patient had ambulated without difficulty.  Based on the fact that the patient had normal fine motor testing seemed unlikely that the patient is suffering from a posterior circulation infarction.  Patient be treated for peripheral positional vertigo and referred back to her primary care Dr.  The patient has been seen by the attending Physician.   MDM   MDM Reviewed: nursing note and vitals Interpretation: labs and ECG    Date: 10/01/2012  Rate: 95  Rhythm: normal sinus rhythm  QRS Axis: normal  Intervals: normal  ST/T  Wave abnormalities: nonspecific T wave changes  Conduction Disutrbances:none  Narrative Interpretation:   Old EKG Reviewed: unchanged          Brent General, PA-C 10/01/12 [phone]

## 2012-10-04 ENCOUNTER — Other Ambulatory Visit: Payer: Self-pay | Admitting: Critical Care Medicine

## 2012-10-05 NOTE — ED Provider Notes (Signed)
Medical screening examination/treatment/procedure(s) were performed by non-physician practitioner and as supervising physician I was immediately available for consultation/collaboration.  [name]. Alvino Chapel, [place] 10/05/12 [phone]

## 2012-10-13 ENCOUNTER — Ambulatory Visit (HOSPITAL_COMMUNITY)
Admission: RE | Admit: 2012-10-13 | Discharge: 2012-10-13 | Disposition: A | Discharge status: Home / Self Care | Payer: Medicare Other | Source: Ambulatory Visit | Attending: Obstetrics & Gynecology | Admitting: Obstetrics & Gynecology

## 2012-10-13 DIAGNOSIS — Z1231 Encounter for screening mammogram for malignant neoplasm of breast: Secondary | ICD-10-CM | POA: Insufficient documentation

## 2012-10-14 ENCOUNTER — Encounter: Payer: Self-pay | Admitting: Critical Care Medicine

## 2012-10-14 ENCOUNTER — Ambulatory Visit (INDEPENDENT_AMBULATORY_CARE_PROVIDER_SITE_OTHER): Payer: Medicare Other | Admitting: Critical Care Medicine

## 2012-10-14 VITALS — BP 126/84 | HR 85 | Temp 98.1°F | Ht 64.0 in | Wt 210.0 lb

## 2012-10-14 DIAGNOSIS — J45909 Unspecified asthma, uncomplicated: Secondary | ICD-10-CM

## 2012-10-14 MED ORDER — MONTELUKAST SODIUM 10 MG PO TABS: 10.0000 mg | ORAL_TABLET | Freq: Every day | ORAL | Status: DC

## 2012-10-14 MED ORDER — FLUTICASONE-SALMETEROL 100-50 MCG/DOSE IN AEPB: 1.0000 | INHALATION_SPRAY | Freq: Two times a day (BID) | RESPIRATORY_TRACT | Status: DC

## 2012-10-14 NOTE — Patient Instructions (Addendum)
Flu vaccine today No change in medications Return 1 year, sooner if needed

## 2012-10-14 NOTE — Progress Notes (Signed)
Subjective:    Patient ID: [name], female    DOB: 01/12/1951, 61 y.o.   MRN: [id number]  HPI  61 y.o.  African-American female with moderate persistent asthma & obstructive sleep apnea .   10/14/2012 Yearly f/u. No new issues,  Dyspnea is better. Pt denies any significant sore throat, nasal congestion or excess secretions, fever, chills, sweats, unintended weight loss, pleurtic or exertional chest pain, orthopnea PND, or leg swelling Pt denies any increase in rescue therapy over baseline, denies waking up needing it or having any early am or nocturnal exacerbations of coughing/wheezing/or dyspnea. Pt also denies any obvious fluctuation in symptoms with  weather or environmental change or other alleviating or aggravating factors   PUL ASTHMA HISTORY 10/14/2012 09/25/2011  Symptoms 0-2 days/week 0-2 days/week  Nighttime awakenings 0-2/month 0-2/month  Interference with activity No limitations No limitations  SABA use 0-2 days/wk 0-2 days/wk  Exacerbations requiring oral steroids 0-1 / year 0-1 / year      Past Medical History  Diagnosis Date  . Allergic rhinitis   . Asthma   . Hypertension   . Small cell b-cell lymphoma     R supra hilar mass- Needle Bx positive 2006  . Crohn's disease   . HTN (hypertension)   . Chronic anemia   . Type 2 diabetes mellitus   . Sleep apnea   . Bilateral sciatica      Family History  Problem Relation Age of Onset  . Diabetes Mother   . Heart attack Father   . Liver cancer Mother      History   Social History  . Marital Status: Married    Spouse Name: N/A    Number of Children: 2  . Years of Education: N/A   Occupational History  . disabled    Social History Main Topics  . Smoking status: Never Smoker   . Smokeless tobacco: Never Used  . Alcohol Use: No  . Drug Use: Not on file  . Sexually Active: Not on file   Other Topics Concern  . Not on file   Social History Narrative  . No narrative on file     No Known  Allergies   Outpatient Prescriptions Prior to Visit  Medication Sig Dispense Refill  . ALPRAZolam (XANAX) 0.25 MG tablet Take 0.25 mg by mouth 3 (three) times daily as needed. For anxiety      . amitriptyline (ELAVIL) 50 MG tablet Take 50 mg by mouth at bedtime.      [name] aspirin EC 81 MG tablet Take 81 mg by mouth daily.      [name] azaTHIOprine (IMURAN) 50 MG tablet Take 50 mg by mouth 3 (three) times daily.        . Calcium Carbonate-Vitamin D (CALCIUM 600 + D PO) Take 1 tablet by mouth daily.       . Cholecalciferol 1000 UNITS tablet Take 1,000 Units by mouth daily.      [name] estrogens, conjugated, (PREMARIN) 0.625 MG tablet Take 0.625 mg by mouth daily.       . meclizine (ANTIVERT) 25 MG tablet Take 1 tablet (25 mg total) by mouth 3 (three) times daily as needed for dizziness.  30 tablet  0  . metFORMIN (GLUCOPHAGE) 500 MG tablet Take 500 mg by mouth 2 (two) times daily with a meal.      . Multiple Vitamin (MULTIVITAMIN WITH MINERALS) TABS Take 1 tablet by mouth daily.      [name] olmesartan-hydrochlorothiazide (BENICAR HCT)  20-12.5 MG per tablet Take 1 tablet by mouth daily.        [name] omeprazole (PRILOSEC) 20 MG capsule take 1 capsule by mouth once daily  30 capsule  11  . sertraline (ZOLOFT) 100 MG tablet Take 100 mg by mouth daily.        . Fluticasone-Salmeterol (ADVAIR DISKUS) 100-50 MCG/DOSE AEPB Inhale 1 puff into the lungs 2 (two) times daily.  180 each  4  . montelukast (SINGULAIR) 10 MG tablet take 1 tablet by mouth once daily  30 tablet  0      Review of Systems  Constitutional:   No  weight loss, night sweats,  Fevers, chills, fatigue, lassitude. HEENT:   No headaches,  Difficulty swallowing,  Tooth/dental problems,  Sore throat,                No sneezing, itching, ear ache, nasal congestion, post nasal drip,   CV:  No chest pain,  Orthopnea, PND, swelling in lower extremities, anasarca, dizziness, palpitations  GI  No heartburn, indigestion, abdominal pain, nausea, vomiting,  diarrhea, change in bowel habits, loss of appetite  Resp: No shortness of breath with exertion or at rest.  No excess mucus, no productive cough,  No non-productive cough,  No coughing up of blood.  No change in color of mucus.  No wheezing.  No chest wall deformity  Skin: no rash or lesions.  GU: no dysuria, change in color of urine, no urgency or frequency.  No flank pain.  MS:  No joint pain or swelling.  No decreased range of motion.  No back pain.  Psych:  No change in mood or affect. No depression or anxiety.  No memory loss.     Objective:   Physical Exam  Filed Vitals:   10/14/12 1442  BP: 126/84  Pulse: 85  Temp: 98.1 F (36.7 C)  TempSrc: Oral  Height: 5' 4"  (1.626 m)  Weight: 210 lb (95.255 kg)  SpO2: 95%    Gen: Pleasant, well-nourished, in no distress,  normal affect  ENT: No lesions,  mouth clear,  oropharynx clear, no postnasal drip  Neck: No JVD, no TMG, no carotid bruits  Lungs: No use of accessory muscles, no dullness to percussion, clear without rales or rhonchi  Cardiovascular: RRR, heart sounds normal, no murmur or gallops, no peripheral edema  Abdomen: soft and NT, no HSM,  BS normal  Musculoskeletal: No deformities, no cyanosis or clubbing  Neuro: alert, non focal  Skin: Warm, no lesions or rashes       Assessment & Plan:   Moderate persistent asthma with significant atopic features Moderate persistent asthma with atopic features stable at this time Plan Maintain Advair  twice daily Flu vaccine was administered Return 1 year    Updated Medication List Outpatient Encounter Prescriptions as of 10/14/2012  Medication Sig Dispense Refill  . ALPRAZolam (XANAX) 0.25 MG tablet Take 0.25 mg by mouth 3 (three) times daily as needed. For anxiety      . amitriptyline (ELAVIL) 50 MG tablet Take 50 mg by mouth at bedtime.      [name] aspirin EC 81 MG tablet Take 81 mg by mouth daily.      [name] azaTHIOprine (IMURAN) 50 MG tablet Take 50 mg by mouth 3  (three) times daily.        . Calcium Carbonate-Vitamin D (CALCIUM 600 + D PO) Take 1 tablet by mouth daily.       . Cholecalciferol 1000 UNITS  tablet Take 1,000 Units by mouth daily.      [name] estrogens, conjugated, (PREMARIN) 0.625 MG tablet Take 0.625 mg by mouth daily.       . Fluticasone-Salmeterol (ADVAIR DISKUS) 100-50 MCG/DOSE AEPB Inhale 1 puff into the lungs 2 (two) times daily.  60 each  11  . meclizine (ANTIVERT) 25 MG tablet Take 1 tablet (25 mg total) by mouth 3 (three) times daily as needed for dizziness.  30 tablet  0  . metFORMIN (GLUCOPHAGE) 500 MG tablet Take 500 mg by mouth 2 (two) times daily with a meal.      . montelukast (SINGULAIR) 10 MG tablet Take 1 tablet (10 mg total) by mouth at bedtime.  90 tablet  4  . Multiple Vitamin (MULTIVITAMIN WITH MINERALS) TABS Take 1 tablet by mouth daily.      [name] olmesartan-hydrochlorothiazide (BENICAR HCT) 20-12.5 MG per tablet Take 1 tablet by mouth daily.        [name] omeprazole (PRILOSEC) 20 MG capsule take 1 capsule by mouth once daily  30 capsule  11  . sertraline (ZOLOFT) 100 MG tablet Take 100 mg by mouth daily.        . [DISCONTINUED] Fluticasone-Salmeterol (ADVAIR DISKUS) 100-50 MCG/DOSE AEPB Inhale 1 puff into the lungs 2 (two) times daily.  180 each  4  . [DISCONTINUED] montelukast (SINGULAIR) 10 MG tablet take 1 tablet by mouth once daily  30 tablet  0

## 2012-10-14 NOTE — Assessment & Plan Note (Signed)
Moderate persistent asthma with atopic features stable at this time Plan Maintain Advair  twice daily Flu vaccine was administered Return 1 year

## 2012-11-30 ENCOUNTER — Other Ambulatory Visit: Payer: Self-pay | Admitting: Critical Care Medicine

## 2013-01-09 ENCOUNTER — Ambulatory Visit: Payer: Medicare Other | Admitting: Oncology

## 2013-01-12 ENCOUNTER — Ambulatory Visit: Payer: Medicare Other | Admitting: Oncology

## 2013-01-12 ENCOUNTER — Ambulatory Visit (HOSPITAL_BASED_OUTPATIENT_CLINIC_OR_DEPARTMENT_OTHER): Payer: Medicare Other | Admitting: Nurse Practitioner

## 2013-01-12 ENCOUNTER — Other Ambulatory Visit: Payer: Medicare Other | Admitting: Lab

## 2013-01-12 ENCOUNTER — Telehealth: Payer: Self-pay | Admitting: Oncology

## 2013-01-12 VITALS — BP 115/67 | HR 109 | Temp 97.1°F | Resp 20 | Ht 64.0 in | Wt 193.3 lb

## 2013-01-12 DIAGNOSIS — C8582 Other specified types of non-Hodgkin lymphoma, intrathoracic lymph nodes: Secondary | ICD-10-CM

## 2013-01-12 LAB — CBC WITH DIFFERENTIAL/PLATELET
BASO%: 1 % (ref 0.0–2.0)
EOS%: 11.3 % — ABNORMAL HIGH (ref 0.0–7.0)
HCT: 34.7 % — ABNORMAL LOW (ref 34.8–46.6)
LYMPH%: 37.2 % (ref 14.0–49.7)
MCH: 31.3 pg (ref 25.1–34.0)
MCHC: 33.1 g/dL (ref 31.5–36.0)
NEUT%: 40.5 % (ref 38.4–76.8)
Platelets: 463 10*3/uL — ABNORMAL HIGH (ref 145–400)

## 2013-01-12 NOTE — Progress Notes (Signed)
OFFICE PROGRESS NOTE  Interval history:  [name] returns as scheduled. She denies fever/sweats. She has a good appetite. She reports intentional weight loss related to dietary changes. No recent Crohn's flares. She has intermittent diarrhea which she relates to metformin. She denies shortness of breath. She has a periodic cough which she attributes to allergies.   Objective: Blood pressure 115/67, pulse 109, temperature 97.1 F (36.2 C), temperature source Oral, resp. rate 20, height 5' 4"  (1.626 m), weight 193 lb 4.8 oz (87.68 kg).  Oropharynx is without thrush or ulceration. No palpable cervical, supraclavicular, axillary or inguinal lymph nodes. Lungs are clear. No wheezes or rales. Regular cardiac rhythm. Abdomen soft and nontender. No organomegaly. Extremities are without edema.  Lab Results: Lab Results  Component Value Date   WBC 5.0 01/12/2013   HGB 11.5* 01/12/2013   HCT 34.7* 01/12/2013   MCV 94.7 01/12/2013   PLT 463* 01/12/2013    Chemistry:    Chemistry      Component Value Date/Time   NA 135 10/01/2012 0523   K 3.5 10/01/2012 0523   CL 98 10/01/2012 0523   CO2 27 10/01/2012 0523   BUN 14 10/01/2012 0523   CREATININE 0.90 10/01/2012 0523      Component Value Date/Time   CALCIUM 9.3 10/01/2012 0523   ALKPHOS 65 10/01/2012 0523   AST 18 10/01/2012 0523   ALT 17 10/01/2012 0523   BILITOT 0.4 10/01/2012 0523       Studies/Results: No results found.  Medications: I have reviewed the patient's current medications.  Assessment/Plan:  1. Non-Hodgkin lymphoma, low grade lymphoma involving the lungs, diagnosed in June 2006. No clinical evidence of progression. 2. Crohn's disease, followed by Dr. Wynetta Emery. She continues to receive intermittent Remicade therapy. She is taking Imuran. 3. Post thoracotomy pain, improved with Elavil.  4. Anemia secondary to chronic disease and gastrointestinal bleeding. Hemoglobin remains stable. 5. History of thrombocytosis. 6. History of  anorexia/weight loss in the setting of active Crohn disease. She reports recent intentional weight loss. She knows to contact the office with loss of appetite, rapid weight loss. 7. History of a serum monoclonal protein, stable on repeat serum protein electrophoresis studies, last in February 2013.  8. History of asthma. She continues Advair.  Disposition-[name] appears stable. She remains in clinical remission from the non-Hodgkin's lymphoma. We will followup on the serum protein electrophoresis from today. She will return for a followup visit in 6 months. She will contact the office in the interim as outlined above or with any other problems.  Plan reviewed with Dr. Benay Spice.   Ned Card ANP/GNP-BC

## 2013-01-12 NOTE — Telephone Encounter (Signed)
Gave pt appt for lab and MD on August 2014

## 2013-01-16 LAB — PROTEIN ELECTROPHORESIS, SERUM
Alpha-2-Globulin: 11.5 % (ref 7.1–11.8)
Gamma Globulin: 18.9 % — ABNORMAL HIGH (ref 11.1–18.8)
M-Spike, %: 0.4 g/dL

## 2013-03-14 ENCOUNTER — Other Ambulatory Visit: Payer: Self-pay | Admitting: Gastroenterology

## 2013-04-11 ENCOUNTER — Other Ambulatory Visit: Payer: Self-pay | Admitting: *Deleted

## 2013-04-11 DIAGNOSIS — C8582 Other specified types of non-Hodgkin lymphoma, intrathoracic lymph nodes: Secondary | ICD-10-CM

## 2013-04-11 MED ORDER — AMITRIPTYLINE HCL 50 MG PO TABS: 50.0000 mg | ORAL_TABLET | Freq: Every day | ORAL | Status: DC

## 2013-07-13 ENCOUNTER — Ambulatory Visit (HOSPITAL_BASED_OUTPATIENT_CLINIC_OR_DEPARTMENT_OTHER): Payer: Medicare Other | Admitting: Oncology

## 2013-07-13 ENCOUNTER — Other Ambulatory Visit (HOSPITAL_BASED_OUTPATIENT_CLINIC_OR_DEPARTMENT_OTHER): Payer: Medicare Other | Admitting: Lab

## 2013-07-13 ENCOUNTER — Telehealth: Payer: Self-pay | Admitting: Oncology

## 2013-07-13 VITALS — BP 98/61 | HR 102 | Temp 97.6°F | Resp 18 | Ht 64.0 in | Wt 177.2 lb

## 2013-07-13 DIAGNOSIS — K5090 Crohn's disease, unspecified, without complications: Secondary | ICD-10-CM

## 2013-07-13 DIAGNOSIS — C8582 Other specified types of non-Hodgkin lymphoma, intrathoracic lymph nodes: Secondary | ICD-10-CM

## 2013-07-13 LAB — CBC WITH DIFFERENTIAL/PLATELET
BASO%: 0.7 % (ref 0.0–2.0)
EOS%: 5.7 % (ref 0.0–7.0)
LYMPH%: 20.7 % (ref 14.0–49.7)
MCH: 30.8 pg (ref 25.1–34.0)
MCHC: 33.2 g/dL (ref 31.5–36.0)
MONO#: 0.7 10*3/uL (ref 0.1–0.9)
Platelets: 680 10*3/uL — ABNORMAL HIGH (ref 145–400)
RBC: 3.34 10*6/uL — ABNORMAL LOW (ref 3.70–5.45)
WBC: 6.8 10*3/uL (ref 3.9–10.3)

## 2013-07-13 NOTE — Telephone Encounter (Signed)
gave pt appt for lab and MD on August 2015

## 2013-07-13 NOTE — Progress Notes (Signed)
   [name]    OFFICE PROGRESS NOTE   INTERVAL HISTORY:   She returns as scheduled. The Crohn's disease is now active. She reports diarrhea, rectal bleeding, malaise, anorexia, and nausea. No fever, night sweats, or palpable lymph nodes. She saw Dr. Wynetta [name] and immunosuppressive therapy for the Crohn's disease is being planned.  Objective:  Vital signs in last 24 hours:  Blood pressure 98/61, pulse 102, temperature 97.6 F (36.4 C), temperature source Oral, resp. rate 18, height 5' 4"  (1.626 m), weight 177 lb 3.2 oz (80.377 kg).    HEENT: Neck without mass Lymphatics: No cervical, supra-clavicular, axillary, or inguinal nodes Resp: Lungs with scattered end inspiratory wheezes, no respiratory distress, good air movement bilaterally Cardio: Regular rate and rhythm GI: No hepatosplenomegaly, no mass Vascular: No leg edema   Lab Results:  Lab Results  Component Value Date   WBC 6.8 07/13/2013   HGB 10.3* 07/13/2013   HCT 31.0* 07/13/2013   MCV 92.9 07/13/2013   PLT 680* 07/13/2013   Serum M spike on 01/12/2013-0.4   Medications: I have reviewed the patient's current medications.  Assessment/Plan: 1. Non-Hodgkin lymphoma, low grade lymphoma involving the lungs, diagnosed in June 2006. No clinical evidence of progression. 2. Crohn's disease, followed by Dr. Wynetta [name].She is taking Imuran. 3. Post thoracotomy pain, improved with Elavil.  4. Anemia secondary to chronic disease and gastrointestinal bleeding. Hemoglobin is lower today 5. History of thrombocytosis. 6. History of anorexia/weight loss in the setting of active Crohn disease. She is again losing weight.  7. History of a serum monoclonal protein, stable on repeat serum protein electrophoresis studies, last in February 2014 8. History of asthma. She continues Advair.   Disposition:  She remains in clinical remission from the non-Hodgkin's lymphoma. The Crohn's disease is now active. I discussed the  case with Dr. Wynetta [name]. He plans to begin a different immunosuppressive drug. I see no contraindication to this treatment with her history of lymphoma. I discussed the potential increased risk of developing or "reactivating "lymphoma with [name]. She plans to contact Dr. Wynetta [name] and begin therapy.  The weight loss, anemia, and thrombocytosis are most likely related to a flare of the Crohn's disease. She will return for an office visit here in 6 months. We will be glad to see her sooner as needed.   Ashley Coder, MD  07/13/2013  6:14 PM

## 2013-07-17 LAB — PROTEIN ELECTROPHORESIS, SERUM
Alpha-2-Globulin: 14.4 % — ABNORMAL HIGH (ref 7.1–11.8)
M-Spike, %: 0.36 g/dL
Total Protein, Serum Electrophoresis: 6.8 g/dL (ref 6.0–8.3)

## 2013-07-28 ENCOUNTER — Other Ambulatory Visit: Payer: Self-pay | Admitting: *Deleted

## 2013-07-28 MED ORDER — OMEPRAZOLE 20 MG PO CPDR: DELAYED_RELEASE_CAPSULE | ORAL | Status: DC

## 2013-08-30 ENCOUNTER — Other Ambulatory Visit: Payer: Self-pay | Admitting: *Deleted

## 2013-08-30 DIAGNOSIS — C8582 Other specified types of non-Hodgkin lymphoma, intrathoracic lymph nodes: Secondary | ICD-10-CM

## 2013-08-30 MED ORDER — AMITRIPTYLINE HCL 50 MG PO TABS: 50.0000 mg | ORAL_TABLET | Freq: Every day | ORAL | Status: DC

## 2013-09-05 ENCOUNTER — Other Ambulatory Visit: Payer: Self-pay | Admitting: Critical Care Medicine

## 2013-09-05 NOTE — Telephone Encounter (Signed)
Last OV with PW: 10/14/12; was asked to f/u in 1 yr No pending appts. Called, spoke with pt.  We have scheduled her to see PW for a yearly f/u on Nov 18 at 11:30 am at [facility].  Pt aware and voiced no further questions or concerns at this time.

## 2013-09-25 ENCOUNTER — Other Ambulatory Visit (HOSPITAL_COMMUNITY): Payer: Self-pay | Admitting: Obstetrics & Gynecology

## 2013-09-25 DIAGNOSIS — Z1231 Encounter for screening mammogram for malignant neoplasm of breast: Secondary | ICD-10-CM

## 2013-10-04 ENCOUNTER — Other Ambulatory Visit: Payer: Self-pay | Admitting: *Deleted

## 2013-10-04 NOTE — Telephone Encounter (Signed)
Refill request to MD desk for approval. 

## 2013-10-05 ENCOUNTER — Other Ambulatory Visit: Payer: Self-pay | Admitting: *Deleted

## 2013-10-05 DIAGNOSIS — C8582 Other specified types of non-Hodgkin lymphoma, intrathoracic lymph nodes: Secondary | ICD-10-CM

## 2013-10-05 MED ORDER — AMITRIPTYLINE HCL 50 MG PO TABS: 50.0000 mg | ORAL_TABLET | Freq: Every day | ORAL | Status: DC

## 2013-10-06 ENCOUNTER — Other Ambulatory Visit: Payer: Self-pay | Admitting: *Deleted

## 2013-10-06 MED ORDER — MONTELUKAST SODIUM 10 MG PO TABS: 10.0000 mg | ORAL_TABLET | Freq: Every day | ORAL | Status: DC

## 2013-10-06 NOTE — Telephone Encounter (Signed)
Last OV with PW: 10/15/12 Pt has pending OV with PW on 10/10/13. Received singulair rx from [facility].   I have sent this in given pt's pending OV.

## 2013-10-10 ENCOUNTER — Encounter: Payer: Self-pay | Admitting: Critical Care Medicine

## 2013-10-10 ENCOUNTER — Encounter (INDEPENDENT_AMBULATORY_CARE_PROVIDER_SITE_OTHER): Payer: Self-pay

## 2013-10-10 ENCOUNTER — Ambulatory Visit (INDEPENDENT_AMBULATORY_CARE_PROVIDER_SITE_OTHER): Payer: Medicare Other | Admitting: Critical Care Medicine

## 2013-10-10 VITALS — BP 94/60 | HR 90 | Temp 98.4°F | Ht 63.5 in | Wt 170.0 lb

## 2013-10-10 DIAGNOSIS — J45909 Unspecified asthma, uncomplicated: Secondary | ICD-10-CM

## 2013-10-10 DIAGNOSIS — Z23 Encounter for immunization: Secondary | ICD-10-CM

## 2013-10-10 NOTE — Patient Instructions (Signed)
Pneumovax was given No other medication changes Return 6 months

## 2013-10-10 NOTE — Progress Notes (Signed)
Subjective:    Patient ID: [name], female    DOB: [birth year], 62 y.o.   MRN: [id number]  HPI  62 y.o.  African-American female with moderate persistent asthma & obstructive sleep apnea .   10/10/2013 Chief Complaint  Patient presents with  . Yearly Follow up    Breathing doing well overall.  Noticed PND and sinues pressure x 2 wks ago - smptoms have improved some.  Prod cough with green mucus x 1 month.  No SOB, wheezing, chest tightness, chest pain, or f/c/s.  No real cough.  Notes some sinus issues, pndrip. Notes some sinus pressure.    Cough is better now.  Got better on its own. No issues with dyspnea   No changes with lymphoma  PUL ASTHMA HISTORY 10/10/2013 10/14/2012 09/25/2011  Symptoms 0-2 days/week 0-2 days/week 0-2 days/week  Nighttime awakenings 0-2/month 0-2/month 0-2/month  Interference with activity No limitations No limitations No limitations  SABA use 0-2 days/wk 0-2 days/wk 0-2 days/wk  Exacerbations requiring oral steroids 0-1 / year 0-1 / year 0-1 / year      Past Medical History  Diagnosis Date  . Allergic rhinitis   . Asthma   . Hypertension   . Small cell b-cell lymphoma     R supra hilar mass- Needle Bx positive 2006  . Crohn's disease   . HTN (hypertension)   . Chronic anemia   . Type 2 diabetes mellitus   . Sleep apnea   . Bilateral sciatica      Family History  Problem Relation Age of Onset  . Diabetes Mother   . Heart attack Father   . Liver cancer Mother      History   Social History  . Marital Status: Married    Spouse Name: N/A    Number of Children: 2  . Years of Education: N/A   Occupational History  . disabled    Social History Main Topics  . Smoking status: Never Smoker   . Smokeless tobacco: Never Used  . Alcohol Use: No  . Drug Use: Not on file  . Sexual Activity: Not on file   Other Topics Concern  . Not on file   Social History Narrative  . No narrative on file     No Known Allergies   Outpatient  Prescriptions Prior to Visit  Medication Sig Dispense Refill  . amitriptyline (ELAVIL) 50 MG tablet Take 1 tablet (50 mg total) by mouth at bedtime.  90 tablet  0  . aspirin EC 81 MG tablet Take 81 mg by mouth daily.      [name] azaTHIOprine (IMURAN) 50 MG tablet Take 50 mg by mouth 3 (three) times daily.        . Calcium Carbonate-Vitamin D (CALCIUM 600 + D PO) Take 1 tablet by mouth daily.       . Canagliflozin (INVOKANA) 300 MG TABS Take 300 mg by mouth daily.      . Cholecalciferol 1000 UNITS tablet Take 1,000 Units by mouth daily.      [name] estradiol (ESTRACE) 1 MG tablet Take 1 mg by mouth daily.      . Fluticasone-Salmeterol (ADVAIR DISKUS) 100-50 MCG/DOSE AEPB Inhale 1 puff into the lungs 2 (two) times daily.  60 each  11  . montelukast (SINGULAIR) 10 MG tablet Take 1 tablet (10 mg total) by mouth at bedtime.  90 tablet  0  . Multiple Vitamin (MULTIVITAMIN WITH MINERALS) TABS Take 1 tablet by mouth daily.      [name]  olmesartan-hydrochlorothiazide (BENICAR HCT) 20-12.5 MG per tablet Take 1 tablet by mouth daily.        [name] omeprazole (PRILOSEC) 20 MG capsule Take 1 capsule by mouth  daily .  90 capsule  0  . sertraline (ZOLOFT) 100 MG tablet Take 100 mg by mouth daily.        [name] ALPRAZolam (XANAX) 0.25 MG tablet Take 0.25 mg by mouth 3 (three) times daily as needed. For anxiety      . meclizine (ANTIVERT) 25 MG tablet Take 1 tablet (25 mg total) by mouth 3 (three) times daily as needed for dizziness.  30 tablet  0   No facility-administered medications prior to visit.      Review of Systems  Constitutional:   No  weight loss, night sweats,  Fevers, chills, fatigue, lassitude. HEENT:   No headaches,  Difficulty swallowing,  Tooth/dental problems,  Sore throat,                No sneezing, itching, ear ache, nasal congestion, post nasal drip,   CV:  No chest pain,  Orthopnea, PND, swelling in lower extremities, anasarca, dizziness, palpitations  GI  No heartburn, indigestion, abdominal pain,  nausea, vomiting, diarrhea, change in bowel habits, loss of appetite  Resp: No shortness of breath with exertion or at rest.  No excess mucus, no productive cough,  No non-productive cough,  No coughing up of blood.  No change in color of mucus.  No wheezing.  No chest wall deformity  Skin: no rash or lesions.  GU: no dysuria, change in color of urine, no urgency or frequency.  No flank pain.  MS:  No joint pain or swelling.  No decreased range of motion.  No back pain.  Psych:  No change in mood or affect. No depression or anxiety.  No memory loss.     Objective:   Physical Exam  Filed Vitals:   10/10/13 1129  BP: 94/60  Pulse: 90  Temp: 98.4 F (36.9 C)  TempSrc: Oral  Height: 5' 3.5" (1.613 m)  Weight: 170 lb (77.111 kg)  SpO2: 98%    Gen: Pleasant, well-nourished, in no distress,  normal affect  ENT: No lesions,  mouth clear,  oropharynx clear, no postnasal drip  Neck: No JVD, no TMG, no carotid bruits  Lungs: No use of accessory muscles, no dullness to percussion, clear without rales or rhonchi  Cardiovascular: RRR, heart sounds normal, no murmur or gallops, no peripheral edema  Abdomen: soft and NT, no HSM,  BS normal  Musculoskeletal: No deformities, no cyanosis or clubbing  Neuro: alert, non focal  Skin: Warm, no lesions or rashes       Assessment & Plan:   Moderate persistent asthma with significant atopic features Moderate persistent asthma with significant atopic features stable at this time Plan Maintain inhaled medications as prescribed Pneumovax was given    Updated Medication List Outpatient Encounter Prescriptions as of 10/10/2013  Medication Sig  . amitriptyline (ELAVIL) 50 MG tablet Take 1 tablet (50 mg total) by mouth at bedtime.  [name] aspirin EC 81 MG tablet Take 81 mg by mouth daily.  [name] azaTHIOprine (IMURAN) 50 MG tablet Take 50 mg by mouth 3 (three) times daily.    . Calcium Carbonate-Vitamin D (CALCIUM 600 + D PO) Take 1 tablet by  mouth daily.   . Canagliflozin (INVOKANA) 300 MG TABS Take 300 mg by mouth daily.  . Cholecalciferol 1000 UNITS tablet Take 1,000 Units by mouth daily.  [name]  estradiol (ESTRACE) 1 MG tablet Take 1 mg by mouth daily.  . fluconazole (DIFLUCAN) 100 MG tablet Take 1 tablet by mouth every 7 (seven) days.  . Fluticasone-Salmeterol (ADVAIR DISKUS) 100-50 MCG/DOSE AEPB Inhale 1 puff into the lungs 2 (two) times daily.  . montelukast (SINGULAIR) 10 MG tablet Take 1 tablet (10 mg total) by mouth at bedtime.  . Multiple Vitamin (MULTIVITAMIN WITH MINERALS) TABS Take 1 tablet by mouth daily.  [name] olmesartan-hydrochlorothiazide (BENICAR HCT) 20-12.5 MG per tablet Take 1 tablet by mouth daily.    [name] omeprazole (PRILOSEC) 20 MG capsule Take 1 capsule by mouth  daily .  . rosuvastatin (CRESTOR) 20 MG tablet Take 20 mg by mouth daily.  . sertraline (ZOLOFT) 100 MG tablet Take 100 mg by mouth daily.    . [DISCONTINUED] ALPRAZolam (XANAX) 0.25 MG tablet Take 0.25 mg by mouth 3 (three) times daily as needed. For anxiety  . [DISCONTINUED] meclizine (ANTIVERT) 25 MG tablet Take 1 tablet (25 mg total) by mouth 3 (three) times daily as needed for dizziness.

## 2013-10-11 NOTE — Assessment & Plan Note (Signed)
Moderate persistent asthma with significant atopic features stable at this time Plan Maintain inhaled medications as prescribed Pneumovax was given

## 2013-10-16 ENCOUNTER — Ambulatory Visit (HOSPITAL_COMMUNITY)
Admission: RE | Admit: 2013-10-16 | Discharge: 2013-10-16 | Disposition: A | Discharge status: Home / Self Care | Payer: Medicare Other | Source: Ambulatory Visit | Attending: Obstetrics & Gynecology | Admitting: Obstetrics & Gynecology

## 2013-10-16 DIAGNOSIS — Z1231 Encounter for screening mammogram for malignant neoplasm of breast: Secondary | ICD-10-CM | POA: Insufficient documentation

## 2013-12-08 ENCOUNTER — Other Ambulatory Visit: Payer: Self-pay | Admitting: Critical Care Medicine

## 2013-12-26 ENCOUNTER — Other Ambulatory Visit (HOSPITAL_COMMUNITY): Payer: Self-pay

## 2013-12-27 ENCOUNTER — Encounter (HOSPITAL_COMMUNITY)
Admission: RE | Admit: 2013-12-27 | Discharge: 2013-12-27 | Disposition: A | Discharge status: Home / Self Care | Payer: Medicare Other | Source: Ambulatory Visit | Attending: Gastroenterology | Admitting: Gastroenterology

## 2013-12-27 DIAGNOSIS — K5090 Crohn's disease, unspecified, without complications: Secondary | ICD-10-CM | POA: Insufficient documentation

## 2013-12-27 MED ORDER — ACETAMINOPHEN 325 MG PO TABS
650.0000 mg | ORAL_TABLET | Freq: Once | ORAL | Status: AC
  Administered 2013-12-27: 650 mg via ORAL

## 2013-12-27 MED ORDER — SODIUM CHLORIDE 0.9 % IV SOLN: INTRAVENOUS | Status: DC

## 2013-12-27 MED ORDER — METHYLPREDNISOLONE SODIUM SUCC 125 MG IJ SOLR
INTRAMUSCULAR | Status: AC
  Filled 2013-12-27: qty 2

## 2013-12-27 MED ORDER — METHYLPREDNISOLONE SODIUM SUCC 125 MG IJ SOLR: 20.0000 mg | Freq: Once | INTRAMUSCULAR | Status: DC

## 2013-12-27 MED ORDER — ACETAMINOPHEN 325 MG PO TABS
ORAL_TABLET | ORAL | Status: AC
  Administered 2013-12-27: 650 mg via ORAL
  Filled 2013-12-27: qty 1

## 2013-12-27 MED ORDER — ACETAMINOPHEN 325 MG PO TABS
ORAL_TABLET | ORAL | Status: AC
  Filled 2013-12-27: qty 1

## 2013-12-27 MED ORDER — SODIUM CHLORIDE 0.9 % IV SOLN
400.0000 mg | Freq: Once | INTRAVENOUS | Status: AC
  Administered 2013-12-27: 400 mg via INTRAVENOUS
  Filled 2013-12-27: qty 40

## 2013-12-27 MED ORDER — METHYLPREDNISOLONE SODIUM SUCC 40 MG IJ SOLR
INTRAMUSCULAR | Status: AC
Start: 2013-12-27 — End: 2013-12-27
  Administered 2013-12-27: 20 mg
  Filled 2013-12-27: qty 1

## 2014-01-22 ENCOUNTER — Telehealth: Payer: Self-pay | Admitting: Oncology

## 2014-01-22 NOTE — Telephone Encounter (Signed)
Talked to pt, gave her appt on 4/16 lab and r/s from 3/13 due to MD PAL

## 2014-02-02 ENCOUNTER — Other Ambulatory Visit: Payer: Medicare Other

## 2014-02-02 ENCOUNTER — Ambulatory Visit: Payer: Medicare Other | Admitting: Oncology

## 2014-02-21 ENCOUNTER — Other Ambulatory Visit (HOSPITAL_COMMUNITY): Payer: Self-pay | Admitting: Gastroenterology

## 2014-02-21 ENCOUNTER — Encounter (HOSPITAL_COMMUNITY): Payer: Self-pay

## 2014-02-21 ENCOUNTER — Encounter (HOSPITAL_COMMUNITY)
Admission: RE | Admit: 2014-02-21 | Discharge: 2014-02-21 | Disposition: A | Discharge status: Home / Self Care | Payer: Medicare Other | Source: Ambulatory Visit | Attending: Gastroenterology | Admitting: Gastroenterology

## 2014-02-21 DIAGNOSIS — K5010 Crohn's disease of large intestine without complications: Secondary | ICD-10-CM | POA: Insufficient documentation

## 2014-02-21 MED ORDER — METHYLPREDNISOLONE SODIUM SUCC 40 MG IJ SOLR
20.0000 mg | INTRAMUSCULAR | Status: DC
  Administered 2014-02-21: 20 mg via INTRAVENOUS
  Filled 2014-02-21: qty 1

## 2014-02-21 MED ORDER — METHYLPREDNISOLONE SODIUM SUCC 125 MG IJ SOLR
20.0000 mg | INTRAMUSCULAR | Status: DC
  Filled 2014-02-21: qty 0.32

## 2014-02-21 MED ORDER — SODIUM CHLORIDE 0.9 % IV SOLN
INTRAVENOUS | Status: DC
  Administered 2014-02-21: 250 mL via INTRAVENOUS

## 2014-02-21 MED ORDER — ACETAMINOPHEN 325 MG PO TABS
650.0000 mg | ORAL_TABLET | ORAL | Status: AC
  Administered 2014-02-21: 650 mg via ORAL
  Filled 2014-02-21: qty 2

## 2014-02-21 MED ORDER — SODIUM CHLORIDE 0.9 % IV SOLN
400.0000 mg | INTRAVENOUS | Status: AC
  Administered 2014-02-21: 400 mg via INTRAVENOUS
  Filled 2014-02-21: qty 40

## 2014-02-21 NOTE — Discharge Instructions (Signed)
Infliximab injection What is this medicine? INFLIXIMAB (in [place] i mab) is used to treat Crohn's disease and ulcerative colitis. It is also used to treat ankylosing spondylitis, psoriasis, and some forms of arthritis. This medicine may be used for other purposes; ask your health care provider or pharmacist if you have questions. COMMON BRAND NAME(S): Remicade What should I tell my health care provider before I take this medicine? They need to know if you have any of these conditions: -diabetes -exposure to tuberculosis -heart failure -hepatitis or liver disease -immune system problems -infection -lung or breathing disease, like COPD -multiple sclerosis -current or past resident of [place] or [place] -seizure disorder -an unusual or allergic reaction to infliximab, mouse proteins, other medicines, foods, dyes, or preservatives -pregnant or trying to get pregnant -breast-feeding How should I use this medicine? This medicine is for injection into a vein. It is usually given by a health care professional in a hospital or clinic setting. A special MedGuide will be given to you by the pharmacist with each prescription and refill. Be sure to read this information carefully each time. Talk to your pediatrician regarding the use of this medicine in children. Special care may be needed. Overdosage: If you think you have taken too much of this medicine contact a poison control center or emergency room at once. NOTE: This medicine is only for you. Do not share this medicine with others. What if I miss a dose? It is important not to miss your dose. Call your doctor or health care professional if you are unable to keep an appointment. What may interact with this medicine? Do not take this medicine with any of the following medications: -anakinra -rilonacept This medicine may also interact with the following medications: -vaccines This list may not describe all possible interactions.  Give your health care provider a list of all the medicines, herbs, non-prescription drugs, or dietary supplements you use. Also tell them if you smoke, drink alcohol, or use illegal drugs. Some items may interact with your medicine. What should I watch for while using this medicine? Visit your doctor or health care professional for regular checks on your progress. If you get a cold or other infection while receiving this medicine, call your doctor or health care professional. Do not treat yourself. This medicine may decrease your body's ability to fight infections. Before beginning therapy, your doctor may do a test to see if you have been exposed to tuberculosis. This medicine may make the symptoms of heart failure worse in some patients. If you notice symptoms such as increased shortness of breath or swelling of the ankles or legs, contact your health care provider right away. If you are going to have surgery or dental work, tell your health care professional or dentist that you have received this medicine. If you take this medicine for plaque psoriasis, stay out of the sun. If you cannot avoid being in the sun, wear protective clothing and use sunscreen. Do not use sun lamps or tanning beds/booths. What side effects may I notice from receiving this medicine? Side effects that you should report to your doctor or health care professional as soon as possible: -allergic reactions like skin rash, itching or hives, swelling of the face, lips, or tongue -chest pain -fever or chills, usually related to the infusion -muscle or joint pain -red, scaly patches or raised bumps on the skin -signs of infection - fever or chills, cough, sore throat, pain or difficulty passing urine -swollen lymph nodes  in the neck, underarm, or groin areas -unexplained weight loss -unusual bleeding or bruising -unusually weak or tired -yellowing of the eyes or skin Side effects that usually do not require medical attention  (report to your doctor or health care professional if they continue or are bothersome): -headache -heartburn or stomach pain -nausea, vomiting This list may not describe all possible side effects. Call your doctor for medical advice about side effects. You may report side effects to FDA at 1-800-FDA-1088. Where should I keep my medicine? This drug is given in a hospital or clinic and will not be stored at home. NOTE: This sheet is a summary. It may not cover all possible information. If you have questions about this medicine, talk to your doctor, pharmacist, or health care provider.  2014, Elsevier/Gold Standard. (2008-06-27 10:26:02)  Crohn's Disease Crohn's disease is a long-term (chronic) soreness and redness (inflammation) of the intestines (bowel). It can affect any portion of the digestive tract, from the mouth to the anus. It can also cause problems outside the digestive tract. Crohn's disease is closely related to a disease called ulcerative colitis (together, these two diseases are called inflammatory bowel disease).  CAUSES  The cause of Crohn's disease is not known. One Link Snuffer is that, in an easily affected person, the immune system is triggered to attack the body's own digestive tissue. Crohn's disease runs in families. It seems to be more common in certain geographic areas and amongst certain races. There are no clear-cut dietary causes.  SYMPTOMS  Crohn's disease can cause many different symptoms since it can affect many different parts of the body. Symptoms include:  Fatigue.  Weight loss.  Chronic diarrhea, sometime bloody.  Abdominal pain and cramps.  Fever.  Ulcers or canker sores in the mouth or rectum.  Anemia (low red blood cells).  Arthritis, skin problems, and eye problems may occur. Complications of Crohn's disease can include:  Series of holes (perforation) of the bowel.  Portions of the intestines sticking to each other (adhesions).  Obstruction of the  bowel.  Fistula formation, typically in the rectal area but also in other areas. A fistula is an opening between the bowels and the outside, or between the bowels and another organ.  A painful crack in the mucous membrane of the anus (rectal fissure). DIAGNOSIS  Your caregiver may suspect Crohn's disease based on your symptoms and an exam. Blood tests may confirm that there is a problem. You may be asked to submit a stool specimen for examination. X-rays and CT scans may be necessary. Ultimately, the diagnosis is usually made after a procedure that uses a flexible tube that is inserted via your mouth or your anus. This is done under sedation and is called either an upper endoscopy or colonoscopy. With these tests, the specialist can take tiny tissue samples and remove them from the inside of the bowel (biopsy). Examination of this biopsy tissue under a microscope can reveal Crohn's disease as the cause of your symptoms. Due to the many different forms that Crohn's disease can take, symptoms may be present for several years before a diagnosis is made. TREATMENT  Medications are often used to decrease inflammation and control the immune system. These include medicines related to aspirin, steroid medications, and newer and stronger medications to slow down the immune system. Some medications may be used as suppositories or enemas. A number of other medications are used or have been studied. Your caregiver will make specific recommendations. HOME CARE INSTRUCTIONS   Symptoms  such as diarrhea can be controlled with medications. Avoid foods that have a laxative effect such as fresh fruit, vegetables and dairy products. During flare ups, you can rest your bowel by refraining from solid foods. Drink clear liquids frequently during the day (electrolyte or re-hydrating fluids are best. Your caregiver can help you with suggestions). Drink often to prevent loss of body fluids (dehydration). When diarrhea has  cleared, eat small meals and more frequently. Avoid food additives and stimulants such as caffeine (coffee, tea, or chocolate). Enzyme supplements may help if you develop intolerance to a sugar in dairy products (lactose). Ask your caregiver or dietitian about specific dietary instructions.  Try to maintain a positive attitude. Learn relaxation techniques such as self hypnosis, mental imaging, and muscle relaxation.  If possible, avoid stresses which can aggravate your condition.  Exercise regularly.  Follow your diet.  Always get plenty of rest. SEEK MEDICAL CARE IF:   Your symptoms fail to improve after a week or two of new treatment.  You experience continued weight loss.  You have ongoing cramps or loose bowels.  You develop a new skin rash, skin sores, or eye problems. SEEK IMMEDIATE MEDICAL CARE IF:   You have worsening of your symptoms or develop new symptoms.  You have a fever.  You develop bloody diarrhea.  You develop severe abdominal pain. MAKE SURE YOU:   Understand these instructions.  Will watch your condition.  Will get help right away if you are not doing well or get worse. Document Released: 08/19/2005 Document Revised: 03/06/2013 Document Reviewed: 07/18/2007 [facility] Patient Information 2014 [place], [place].

## 2014-02-21 NOTE — Progress Notes (Signed)
Uneventful infusion [facility]ADE [facility]. Marland KitchenDischarged ambulatory accompanied by husband to main lobby

## 2014-03-05 ENCOUNTER — Ambulatory Visit: Payer: Medicare Other | Admitting: Oncology

## 2014-03-08 ENCOUNTER — Telehealth: Payer: Self-pay | Admitting: Oncology

## 2014-03-08 ENCOUNTER — Other Ambulatory Visit (HOSPITAL_BASED_OUTPATIENT_CLINIC_OR_DEPARTMENT_OTHER): Payer: Medicare Other

## 2014-03-08 ENCOUNTER — Ambulatory Visit (HOSPITAL_BASED_OUTPATIENT_CLINIC_OR_DEPARTMENT_OTHER): Payer: Medicare Other | Admitting: Oncology

## 2014-03-08 VITALS — BP 107/68 | HR 74 | Temp 97.4°F | Resp 18 | Ht 64.0 in | Wt 172.0 lb

## 2014-03-08 DIAGNOSIS — C8582 Other specified types of non-Hodgkin lymphoma, intrathoracic lymph nodes: Secondary | ICD-10-CM

## 2014-03-08 DIAGNOSIS — K5090 Crohn's disease, unspecified, without complications: Secondary | ICD-10-CM

## 2014-03-08 DIAGNOSIS — D638 Anemia in other chronic diseases classified elsewhere: Secondary | ICD-10-CM

## 2014-03-08 LAB — CBC WITH DIFFERENTIAL/PLATELET
BASO%: 0.7 % (ref 0.0–2.0)
Basophils Absolute: 0 10*3/uL (ref 0.0–0.1)
EOS ABS: 0.2 10*3/uL (ref 0.0–0.5)
EOS%: 5.3 % (ref 0.0–7.0)
HCT: 31.8 % — ABNORMAL LOW (ref 34.8–46.6)
HGB: 10.2 g/dL — ABNORMAL LOW (ref 11.6–15.9)
LYMPH%: 46.1 % (ref 14.0–49.7)
MCH: 29.6 pg (ref 25.1–34.0)
MCHC: 32.2 g/dL (ref 31.5–36.0)
MCV: 92.1 fL (ref 79.5–101.0)
MONO#: 0.6 10*3/uL (ref 0.1–0.9)
MONO%: 12.1 % (ref 0.0–14.0)
NEUT%: 35.8 % — ABNORMAL LOW (ref 38.4–76.8)
NEUTROS ABS: 1.7 10*3/uL (ref 1.5–6.5)
PLATELETS: 414 10*3/uL — AB (ref 145–400)
RBC: 3.45 10*6/uL — AB (ref 3.70–5.45)
RDW: 17.3 % — AB (ref 11.2–14.5)
WBC: 4.6 10*3/uL (ref 3.9–10.3)
lymph#: 2.1 10*3/uL (ref 0.9–3.3)

## 2014-03-08 MED ORDER — AMITRIPTYLINE HCL 50 MG PO TABS: 50.0000 mg | ORAL_TABLET | Freq: Every day | ORAL | Status: DC

## 2014-03-08 NOTE — Progress Notes (Signed)
  [place] OFFICE PROGRESS NOTE   Diagnosis: Non-Hodgkin's lymphoma  INTERVAL HISTORY:   [name] returns as scheduled. She continues every 8 week treatment with Remicade for management of inflammatory bowel disease. She reports improvement in diarrhea/bleeding with the Remicade. She has chronic pain at the right thoracotomy site. No fever, night sweats, or palpable lymph nodes. She relates weight loss over the past year to Glucophage and the active Crohn's disease.  Objective:  Vital signs in last 24 hours:  Blood pressure 107/68, pulse 74, temperature 97.4 F (36.3 C), temperature source Oral, resp. rate 18, height 5' 4"  (1.626 m), weight 172 lb (78.019 kg).    HEENT: Neck without mass Lymphatics: No cervical, supra-clavicular, axillary, or inguinal nodes Resp: Lungs with scattered rhonchi, no respiratory distress Cardio: Regular rate and rhythm GI: No hepatosplenomegaly, no mass Vascular: The left lower leg is larger than the right side   Lab Results:  Lab Results  Component Value Date   WBC 4.6 03/08/2014   HGB 10.2* 03/08/2014   HCT 31.8* 03/08/2014   MCV 92.1 03/08/2014   PLT 414* 03/08/2014   NEUTROABS 1.7 03/08/2014    07/13/2013-serum M spike 0.36   Imaging:  No results found.  Medications: I have reviewed the patient's current medications.  Assessment/Plan: 1. Non-Hodgkin lymphoma, low grade lymphoma involving the lungs, diagnosed in June 2006. No clinical evidence of progression. 2. Crohn's disease, followed by Dr. Wynetta Emery.She is taking Imuran and Remicade. 3. Post thoracotomy pain, improved with Elavil.  4. Anemia secondary to chronic disease and gastrointestinal bleeding. Hemoglobin is stable. 5. History of thrombocytosis. 6. History of anorexia/weight loss in the setting of active Crohn disease.  7. History of a serum monoclonal protein, stable on repeat serum protein electrophoresis studies, last in August 2014 8. History of asthma.  She continues Advair.   Disposition:  There is no clinical evidence for progression of the non-Hodgkin's lymphoma. Ms. Bold return for an office visit in 6 months. She continues followup with Dr. Wynetta Emery for management of Crohn's disease.  Ladell Pier, MD  03/08/2014  11:31 AM

## 2014-03-08 NOTE — Telephone Encounter (Signed)
gave pt appt for lab and MD for 6 months

## 2014-03-26 ENCOUNTER — Other Ambulatory Visit: Payer: Self-pay | Admitting: Critical Care Medicine

## 2014-04-23 ENCOUNTER — Other Ambulatory Visit (HOSPITAL_COMMUNITY): Payer: Self-pay | Admitting: Gastroenterology

## 2014-04-23 ENCOUNTER — Encounter (HOSPITAL_COMMUNITY): Admission: RE | Admit: 2014-04-23 | Payer: Medicare Other | Source: Ambulatory Visit

## 2014-04-25 ENCOUNTER — Encounter: Payer: Self-pay | Admitting: Critical Care Medicine

## 2014-04-25 ENCOUNTER — Ambulatory Visit (INDEPENDENT_AMBULATORY_CARE_PROVIDER_SITE_OTHER): Payer: Medicare Other | Admitting: Critical Care Medicine

## 2014-04-25 VITALS — BP 106/70 | HR 74 | Temp 98.0°F | Ht 64.0 in | Wt 177.8 lb

## 2014-04-25 DIAGNOSIS — J45909 Unspecified asthma, uncomplicated: Secondary | ICD-10-CM

## 2014-04-25 DIAGNOSIS — K5090 Crohn's disease, unspecified, without complications: Secondary | ICD-10-CM

## 2014-04-25 DIAGNOSIS — C8582 Other specified types of non-Hodgkin lymphoma, intrathoracic lymph nodes: Secondary | ICD-10-CM

## 2014-04-25 MED ORDER — FLUTICASONE-SALMETEROL 100-50 MCG/DOSE IN AEPB: INHALATION_SPRAY | RESPIRATORY_TRACT | Status: DC

## 2014-04-25 MED ORDER — MONTELUKAST SODIUM 10 MG PO TABS: 10.0000 mg | ORAL_TABLET | Freq: Every day | ORAL | Status: DC

## 2014-04-25 NOTE — Progress Notes (Signed)
Subjective:    Patient ID: [name], female    DOB: [birth year], 63 y.o.   MRN: [id number]  HPI  63 y.o.  African-American female with moderate persistent asthma & obstructive sleep apnea .   04/25/2014 Chief Complaint  Patient presents with  . 6 month follow up    Notices PND, sinus pressure, and cough with greenish yellow mucus.  Symptoms have improved some since last OV.  No SOB, chest tigtness/pain, or f/c/s.  No new issues.  Notes some sinus pressure, some green mucus when cough.  No recent ABX. Symptoms off and on since 09/2013.  No changes with lymphoma, being observed  Pt on imuran for crohns dz.  LFT/CBC monitor per [facility] PUL ASTHMA HISTORY 04/25/2014 10/10/2013 10/14/2012 09/25/2011  Symptoms 0-2 days/week 0-2 days/week 0-2 days/week 0-2 days/week  Nighttime awakenings 0-2/month 0-2/month 0-2/month 0-2/month  Interference with activity No limitations No limitations No limitations No limitations  SABA use 0-2 days/wk 0-2 days/wk 0-2 days/wk 0-2 days/wk  Exacerbations requiring oral steroids 0-1 / year 0-1 / year 0-1 / year 0-1 / year   Review of Systems  Constitutional:   No  weight loss, night sweats,  Fevers, chills, fatigue, lassitude. HEENT:   No headaches,  Difficulty swallowing,  Tooth/dental problems,  Sore throat,                No sneezing, itching, ear ache, +++nasal congestion, +++post nasal drip,   CV:  No chest pain,  Orthopnea, PND, swelling in lower extremities, anasarca, dizziness, palpitations  GI  No heartburn, indigestion, abdominal pain, nausea, vomiting, diarrhea, change in bowel habits, loss of appetite  Resp: No shortness of breath with exertion or at rest.  No excess mucus, notes  productive cough,  No non-productive cough,  No coughing up of blood.  Notes change in color of mucus.  No wheezing.  No chest wall deformity  Skin: no rash or lesions.  GU: no dysuria, change in color of urine, no urgency or frequency.  No flank pain.  MS:  No  joint pain or swelling.  No decreased range of motion.  No back pain.  Psych:  No change in mood or affect. No depression or anxiety.  No memory loss.     Objective:   Physical Exam  Filed Vitals:   04/25/14 1137  BP: 106/70  Pulse: 74  Temp: 98 F (36.7 C)  TempSrc: Oral  Height: 5' 4"  (1.626 m)  Weight: 177 lb 12.8 oz (80.65 kg)  SpO2: 98%    Gen: Pleasant, well-nourished, in no distress,  normal affect  ENT: No lesions,  mouth clear,  oropharynx clear, no postnasal drip  Neck: No JVD, no TMG, no carotid bruits  Lungs: No use of accessory muscles, no dullness to percussion, clear without rales or rhonchi  Cardiovascular: RRR, heart sounds normal, no murmur or gallops, no peripheral edema  Abdomen: soft and NT, no HSM,  BS normal  Musculoskeletal: No deformities, no cyanosis or clubbing  Neuro: alert, non focal  Skin: Warm, no lesions or rashes     Assessment & Plan:   Moderate persistent asthma with significant atopic features Moderate persistent asthma stable at this time Plan Maintain inhaled medications as prescribed    Updated Medication List Outpatient Encounter Prescriptions as of 04/25/2014  Medication Sig  . amitriptyline (ELAVIL) 50 MG tablet Take 1 tablet (50 mg total) by mouth at bedtime.  [name] aspirin EC 81 MG tablet Take 81 mg by mouth  daily.  . azaTHIOprine (IMURAN) 50 MG tablet Take 150 mg by mouth daily.   . Calcium Carbonate-Vitamin D (CALCIUM 600 + D PO) Take 1 tablet by mouth daily.   . Canagliflozin (INVOKANA) 300 MG TABS Take 300 mg by mouth daily.  . Cholecalciferol 1000 UNITS tablet Take 1,000 Units by mouth daily.  [name] estradiol (ESTRACE) 1 MG tablet Take 1 mg by mouth daily.  . fluconazole (DIFLUCAN) 100 MG tablet Take 100 mg by mouth every 7 (seven) days.  . Fluticasone-Salmeterol (ADVAIR DISKUS) 100-50 MCG/DOSE AEPB inhale 1 dose by mouth twice a day  . InFLIXimab (REMICADE IV) Inject into the vein every 8 (eight) weeks. For Crohn's   . montelukast (SINGULAIR) 10 MG tablet Take 1 tablet (10 mg total) by mouth at bedtime.  . Multiple Vitamin (MULTIVITAMIN WITH MINERALS) TABS Take 1 tablet by mouth daily.  [name] olmesartan-hydrochlorothiazide (BENICAR HCT) 20-12.5 MG per tablet Take 1 tablet by mouth daily.    [name] omeprazole (PRILOSEC) 20 MG capsule Take 1 capsule by mouth   daily .  . rosuvastatin (CRESTOR) 20 MG tablet Take 20 mg by mouth daily.  . sertraline (ZOLOFT) 100 MG tablet Take 100 mg by mouth daily.    . [DISCONTINUED] ADVAIR DISKUS 100-50 MCG/DOSE AEPB inhale 1 dose by mouth twice a day  . [DISCONTINUED] fluconazole (DIFLUCAN) 100 MG tablet Take 1 tablet by mouth every 7 (seven) days.  . [DISCONTINUED] montelukast (SINGULAIR) 10 MG tablet Take 1 tablet (10 mg total) by mouth at bedtime.

## 2014-04-25 NOTE — Patient Instructions (Signed)
No change in medications. Return in        6 months        

## 2014-04-26 NOTE — Assessment & Plan Note (Signed)
Moderate persistent asthma stable at this time Plan Maintain inhaled medications as prescribed

## 2014-05-03 ENCOUNTER — Encounter (HOSPITAL_COMMUNITY)
Admission: RE | Admit: 2014-05-03 | Discharge: 2014-05-03 | Disposition: A | Discharge status: Home / Self Care | Payer: Medicare Other | Source: Ambulatory Visit | Attending: Gastroenterology | Admitting: Gastroenterology

## 2014-05-03 ENCOUNTER — Encounter (HOSPITAL_COMMUNITY): Payer: Self-pay

## 2014-05-03 DIAGNOSIS — K5010 Crohn's disease of large intestine without complications: Secondary | ICD-10-CM | POA: Insufficient documentation

## 2014-05-03 MED ORDER — METHYLPREDNISOLONE SODIUM SUCC 125 MG IJ SOLR
20.0000 mg | INTRAMUSCULAR | Status: DC
  Administered 2014-05-03: 20 mg via INTRAVENOUS
  Filled 2014-05-03: qty 0.32

## 2014-05-03 MED ORDER — SODIUM CHLORIDE 0.9 % IV SOLN
400.0000 mg | INTRAVENOUS | Status: DC
  Administered 2014-05-03: 400 mg via INTRAVENOUS
  Filled 2014-05-03: qty 40

## 2014-05-03 MED ORDER — ACETAMINOPHEN 325 MG PO TABS
650.0000 mg | ORAL_TABLET | ORAL | Status: DC
  Administered 2014-05-03: 650 mg via ORAL
  Filled 2014-05-03: qty 2

## 2014-05-03 MED ORDER — SODIUM CHLORIDE 0.9 % IV SOLN
INTRAVENOUS | Status: DC
  Administered 2014-05-03: 08:00:00 via INTRAVENOUS

## 2014-05-03 NOTE — Discharge Instructions (Signed)
Infliximab injection What is this medicine? INFLIXIMAB (in [place] i mab) is used to treat Crohn's disease and ulcerative colitis. It is also used to treat ankylosing spondylitis, psoriasis, and some forms of arthritis. This medicine may be used for other purposes; ask your health care provider or pharmacist if you have questions. COMMON BRAND NAME(S): Remicade What should I tell my health care provider before I take this medicine? They need to know if you have any of these conditions: -diabetes -exposure to tuberculosis -heart failure -hepatitis or liver disease -immune system problems -infection -lung or breathing disease, like COPD -multiple sclerosis -current or past resident of [place] or [place] -seizure disorder -an unusual or allergic reaction to infliximab, mouse proteins, other medicines, foods, dyes, or preservatives -pregnant or trying to get pregnant -breast-feeding How should I use this medicine? This medicine is for injection into a vein. It is usually given by a health care professional in a hospital or clinic setting. A special MedGuide will be given to you by the pharmacist with each prescription and refill. Be sure to read this information carefully each time. Talk to your pediatrician regarding the use of this medicine in children. Special care may be needed. Overdosage: If you think you have taken too much of this medicine contact a poison control center or emergency room at once. NOTE: This medicine is only for you. Do not share this medicine with others. What if I miss a dose? It is important not to miss your dose. Call your doctor or health care professional if you are unable to keep an appointment. What may interact with this medicine? Do not take this medicine with any of the following medications: -anakinra -rilonacept This medicine may also interact with the following medications: -vaccines This list may not describe all possible interactions.  Give your health care provider a list of all the medicines, herbs, non-prescription drugs, or dietary supplements you use. Also tell them if you smoke, drink alcohol, or use illegal drugs. Some items may interact with your medicine. What should I watch for while using this medicine? Visit your doctor or health care professional for regular checks on your progress. If you get a cold or other infection while receiving this medicine, call your doctor or health care professional. Do not treat yourself. This medicine may decrease your body's ability to fight infections. Before beginning therapy, your doctor may do a test to see if you have been exposed to tuberculosis. This medicine may make the symptoms of heart failure worse in some patients. If you notice symptoms such as increased shortness of breath or swelling of the ankles or legs, contact your health care provider right away. If you are going to have surgery or dental work, tell your health care professional or dentist that you have received this medicine. If you take this medicine for plaque psoriasis, stay out of the sun. If you cannot avoid being in the sun, wear protective clothing and use sunscreen. Do not use sun lamps or tanning beds/booths. What side effects may I notice from receiving this medicine? Side effects that you should report to your doctor or health care professional as soon as possible: -allergic reactions like skin rash, itching or hives, swelling of the face, lips, or tongue -chest pain -fever or chills, usually related to the infusion -muscle or joint pain -red, scaly patches or raised bumps on the skin -signs of infection - fever or chills, cough, sore throat, pain or difficulty passing urine -swollen lymph nodes  in the neck, underarm, or groin areas -unexplained weight loss -unusual bleeding or bruising -unusually weak or tired -yellowing of the eyes or skin Side effects that usually do not require medical attention  (report to your doctor or health care professional if they continue or are bothersome): -headache -heartburn or stomach pain -nausea, vomiting This list may not describe all possible side effects. Call your doctor for medical advice about side effects. You may report side effects to FDA at 1-800-FDA-1088. Where should I keep my medicine? This drug is given in a hospital or clinic and will not be stored at home. NOTE: This sheet is a summary. It may not cover all possible information. If you have questions about this medicine, talk to your doctor, pharmacist, or health care provider.  2014, Elsevier/Gold Standard. (2008-06-27 10:26:02)  Crohn's Disease Crohn's disease is a long-term (chronic) soreness and redness (inflammation) of the intestines (bowel). It can affect any portion of the digestive tract, from the mouth to the anus. It can also cause problems outside the digestive tract. Crohn's disease is closely related to a disease called ulcerative colitis (together, these two diseases are called inflammatory bowel disease).  CAUSES  The cause of Crohn's disease is not known. One Link Snuffer is that, in an easily affected person, the immune system is triggered to attack the body's own digestive tissue. Crohn's disease runs in families. It seems to be more common in certain geographic areas and amongst certain races. There are no clear-cut dietary causes.  SYMPTOMS  Crohn's disease can cause many different symptoms since it can affect many different parts of the body. Symptoms include:  Fatigue.  Weight loss.  Chronic diarrhea, sometime bloody.  Abdominal pain and cramps.  Fever.  Ulcers or canker sores in the mouth or rectum.  Anemia (low red blood cells).  Arthritis, skin problems, and eye problems may occur. Complications of Crohn's disease can include:  Series of holes (perforation) of the bowel.  Portions of the intestines sticking to each other (adhesions).  Obstruction of the  bowel.  Fistula formation, typically in the rectal area but also in other areas. A fistula is an opening between the bowels and the outside, or between the bowels and another organ.  A painful crack in the mucous membrane of the anus (rectal fissure). DIAGNOSIS  Your caregiver may suspect Crohn's disease based on your symptoms and an exam. Blood tests may confirm that there is a problem. You may be asked to submit a stool specimen for examination. X-rays and CT scans may be necessary. Ultimately, the diagnosis is usually made after a procedure that uses a flexible tube that is inserted via your mouth or your anus. This is done under sedation and is called either an upper endoscopy or colonoscopy. With these tests, the specialist can take tiny tissue samples and remove them from the inside of the bowel (biopsy). Examination of this biopsy tissue under a microscope can reveal Crohn's disease as the cause of your symptoms. Due to the many different forms that Crohn's disease can take, symptoms may be present for several years before a diagnosis is made. TREATMENT  Medications are often used to decrease inflammation and control the immune system. These include medicines related to aspirin, steroid medications, and newer and stronger medications to slow down the immune system. Some medications may be used as suppositories or enemas. A number of other medications are used or have been studied. Your caregiver will make specific recommendations. HOME CARE INSTRUCTIONS   Symptoms  such as diarrhea can be controlled with medications. Avoid foods that have a laxative effect such as fresh fruit, vegetables and dairy products. During flare ups, you can rest your bowel by refraining from solid foods. Drink clear liquids frequently during the day (electrolyte or re-hydrating fluids are best. Your caregiver can help you with suggestions). Drink often to prevent loss of body fluids (dehydration). When diarrhea has  cleared, eat small meals and more frequently. Avoid food additives and stimulants such as caffeine (coffee, tea, or chocolate). Enzyme supplements may help if you develop intolerance to a sugar in dairy products (lactose). Ask your caregiver or dietitian about specific dietary instructions.  Try to maintain a positive attitude. Learn relaxation techniques such as self hypnosis, mental imaging, and muscle relaxation.  If possible, avoid stresses which can aggravate your condition.  Exercise regularly.  Follow your diet.  Always get plenty of rest. SEEK MEDICAL CARE IF:   Your symptoms fail to improve after a week or two of new treatment.  You experience continued weight loss.  You have ongoing cramps or loose bowels.  You develop a new skin rash, skin sores, or eye problems. SEEK IMMEDIATE MEDICAL CARE IF:   You have worsening of your symptoms or develop new symptoms.  You have a fever.  You develop bloody diarrhea.  You develop severe abdominal pain. MAKE SURE YOU:   Understand these instructions.  Will watch your condition.  Will get help right away if you are not doing well or get worse. Document Released: 08/19/2005 Document Revised: 03/06/2013 Document Reviewed: 07/18/2007 [facility] Patient Information 2014 [place], [place].

## 2014-06-28 ENCOUNTER — Encounter (HOSPITAL_COMMUNITY)
Admission: RE | Admit: 2014-06-28 | Discharge: 2014-06-28 | Disposition: A | Discharge status: Home / Self Care | Payer: Medicare Other | Source: Ambulatory Visit | Attending: Gastroenterology | Admitting: Gastroenterology

## 2014-06-28 ENCOUNTER — Encounter (HOSPITAL_COMMUNITY): Payer: Self-pay

## 2014-06-28 DIAGNOSIS — K5010 Crohn's disease of large intestine without complications: Secondary | ICD-10-CM | POA: Insufficient documentation

## 2014-06-28 MED ORDER — SODIUM CHLORIDE 0.9 % IV SOLN
INTRAVENOUS | Status: AC
  Administered 2014-06-28: 250 mL via INTRAVENOUS

## 2014-06-28 MED ORDER — ACETAMINOPHEN 325 MG PO TABS
650.0000 mg | ORAL_TABLET | ORAL | Status: AC
  Administered 2014-06-28: 650 mg via ORAL
  Filled 2014-06-28: qty 2

## 2014-06-28 MED ORDER — METHYLPREDNISOLONE SODIUM SUCC 125 MG IJ SOLR
20.0000 mg | INTRAMUSCULAR | Status: AC
  Administered 2014-06-28: 20 mg via INTRAVENOUS
  Filled 2014-06-28: qty 0.32

## 2014-06-28 MED ORDER — SODIUM CHLORIDE 0.9 % IV SOLN
400.0000 mg | INTRAVENOUS | Status: AC
  Administered 2014-06-28: 400 mg via INTRAVENOUS
  Filled 2014-06-28: qty 40

## 2014-06-28 NOTE — Discharge Instructions (Signed)
IF YOU ARE GOING TO BE 15 OR MINUTES LATE FOR YOUR APPOINTMENT, PLEASE CALL [phone] TO MAKE OTHER ARRANGEMENTS FOR YOUR TREATMENT  IF YOU ARRIVE EARLY FOR YOUR SCHEDULED APPOINTMENT , YOU MAY HAVE TO WAIT UNTIL YOUR SCHEDULED TIME.Infliximab injection What is this medicine? INFLIXIMAB (in [place] i mab) is used to treat Crohn's disease and ulcerative colitis. It is also used to treat ankylosing spondylitis, psoriasis, and some forms of arthritis. This medicine may be used for other purposes; ask your health care provider or pharmacist if you have questions. COMMON BRAND NAME(S): Remicade What should I tell my health care provider before I take this medicine? They need to know if you have any of these conditions: -diabetes -exposure to tuberculosis -heart failure -hepatitis or liver disease -immune system problems -infection -lung or breathing disease, like COPD -multiple sclerosis -current or past resident of [place] or [place] -seizure disorder -an unusual or allergic reaction to infliximab, mouse proteins, other medicines, foods, dyes, or preservatives -pregnant or trying to get pregnant -breast-feeding How should I use this medicine? This medicine is for injection into a vein. It is usually given by a health care professional in a hospital or clinic setting. A special MedGuide will be given to you by the pharmacist with each prescription and refill. Be sure to read this information carefully each time. Talk to your pediatrician regarding the use of this medicine in children. Special care may be needed. Overdosage: If you think you have taken too much of this medicine contact a poison control center or emergency room at once. NOTE: This medicine is only for you. Do not share this medicine with others. What if I miss a dose? It is important not to miss your dose. Call your doctor or health care professional if you are unable to keep an appointment. What may interact with  this medicine? Do not take this medicine with any of the following medications: -anakinra -rilonacept This medicine may also interact with the following medications: -vaccines This list may not describe all possible interactions. Give your health care provider a list of all the medicines, herbs, non-prescription drugs, or dietary supplements you use. Also tell them if you smoke, drink alcohol, or use illegal drugs. Some items may interact with your medicine. What should I watch for while using this medicine? Visit your doctor or health care professional for regular checks on your progress. If you get a cold or other infection while receiving this medicine, call your doctor or health care professional. Do not treat yourself. This medicine may decrease your body's ability to fight infections. Before beginning therapy, your doctor may do a test to see if you have been exposed to tuberculosis. This medicine may make the symptoms of heart failure worse in some patients. If you notice symptoms such as increased shortness of breath or swelling of the ankles or legs, contact your health care provider right away. If you are going to have surgery or dental work, tell your health care professional or dentist that you have received this medicine. If you take this medicine for plaque psoriasis, stay out of the sun. If you cannot avoid being in the sun, wear protective clothing and use sunscreen. Do not use sun lamps or tanning beds/booths. What side effects may I notice from receiving this medicine? Side effects that you should report to your doctor or health care professional as soon as possible: -allergic reactions like skin rash, itching or hives, swelling of the face, lips, or tongue -  chest pain -fever or chills, usually related to the infusion -muscle or joint pain -red, scaly patches or raised bumps on the skin -signs of infection - fever or chills, cough, sore throat, pain or difficulty passing  urine -swollen lymph nodes in the neck, underarm, or groin areas -unexplained weight loss -unusual bleeding or bruising -unusually weak or tired -yellowing of the eyes or skin Side effects that usually do not require medical attention (report to your doctor or health care professional if they continue or are bothersome): -headache -heartburn or stomach pain -nausea, vomiting This list may not describe all possible side effects. Call your doctor for medical advice about side effects. You may report side effects to FDA at 1-800-FDA-1088. Where should I keep my medicine? This drug is given in a hospital or clinic and will not be stored at home. NOTE: This sheet is a summary. It may not cover all possible information. If you have questions about this medicine, talk to your doctor, pharmacist, or health care provider.  2015, Elsevier/Gold Standard. (2008-06-27 10:26:02)

## 2014-08-23 ENCOUNTER — Encounter (HOSPITAL_COMMUNITY): Admission: RE | Admit: 2014-08-23 | Payer: Medicare Other | Source: Ambulatory Visit

## 2014-09-03 ENCOUNTER — Telehealth: Payer: Self-pay | Admitting: Oncology

## 2014-09-03 ENCOUNTER — Ambulatory Visit (HOSPITAL_BASED_OUTPATIENT_CLINIC_OR_DEPARTMENT_OTHER): Payer: Medicare Other | Admitting: Oncology

## 2014-09-03 ENCOUNTER — Other Ambulatory Visit (HOSPITAL_BASED_OUTPATIENT_CLINIC_OR_DEPARTMENT_OTHER): Payer: Medicare Other

## 2014-09-03 VITALS — BP 126/63 | HR 83 | Temp 98.4°F | Resp 18 | Ht 64.0 in | Wt 192.4 lb

## 2014-09-03 DIAGNOSIS — C8592 Non-Hodgkin lymphoma, unspecified, intrathoracic lymph nodes: Secondary | ICD-10-CM

## 2014-09-03 DIAGNOSIS — Z23 Encounter for immunization: Secondary | ICD-10-CM

## 2014-09-03 DIAGNOSIS — C8519 Unspecified B-cell lymphoma, extranodal and solid organ sites: Secondary | ICD-10-CM

## 2014-09-03 LAB — COMPREHENSIVE METABOLIC PANEL (CC13)
ALT: 24 U/L (ref 0–55)
AST: 25 U/L (ref 5–34)
Albumin: 3 g/dL — ABNORMAL LOW (ref 3.5–5.0)
Alkaline Phosphatase: 78 U/L (ref 40–150)
Anion Gap: 8 mEq/L (ref 3–11)
BILIRUBIN TOTAL: 0.55 mg/dL (ref 0.20–1.20)
BUN: 15 mg/dL (ref 7.0–26.0)
CO2: 30 meq/L — AB (ref 22–29)
CREATININE: 1 mg/dL (ref 0.6–1.1)
Calcium: 9.7 mg/dL (ref 8.4–10.4)
Chloride: 101 mEq/L (ref 98–109)
GLUCOSE: 230 mg/dL — AB (ref 70–140)
Potassium: 4.1 mEq/L (ref 3.5–5.1)
Sodium: 139 mEq/L (ref 136–145)
Total Protein: 7.6 g/dL (ref 6.4–8.3)

## 2014-09-03 LAB — CBC WITH DIFFERENTIAL/PLATELET
BASO%: 0.6 % (ref 0.0–2.0)
Basophils Absolute: 0 10*3/uL (ref 0.0–0.1)
EOS ABS: 0.2 10*3/uL (ref 0.0–0.5)
EOS%: 5.4 % (ref 0.0–7.0)
HEMATOCRIT: 37.4 % (ref 34.8–46.6)
HGB: 12.1 g/dL (ref 11.6–15.9)
LYMPH%: 28.2 % (ref 14.0–49.7)
MCH: 31.1 pg (ref 25.1–34.0)
MCHC: 32.3 g/dL (ref 31.5–36.0)
MCV: 96.3 fL (ref 79.5–101.0)
MONO#: 0.4 10*3/uL (ref 0.1–0.9)
MONO%: 8.1 % (ref 0.0–14.0)
NEUT%: 57.7 % (ref 38.4–76.8)
NEUTROS ABS: 2.5 10*3/uL (ref 1.5–6.5)
PLATELETS: 342 10*3/uL (ref 145–400)
RBC: 3.89 10*6/uL (ref 3.70–5.45)
RDW: 16 % — ABNORMAL HIGH (ref 11.2–14.5)
WBC: 4.4 10*3/uL (ref 3.9–10.3)
lymph#: 1.2 10*3/uL (ref 0.9–3.3)

## 2014-09-03 MED ORDER — INFLUENZA VAC SPLIT QUAD 0.5 ML IM SUSY
0.5000 mL | PREFILLED_SYRINGE | Freq: Once | INTRAMUSCULAR | Status: AC
  Administered 2014-09-03: 0.5 mL via INTRAMUSCULAR
  Filled 2014-09-03: qty 0.5

## 2014-09-03 NOTE — Telephone Encounter (Signed)
Pt confirmed labs/ov per 10/12 POF, gave pt AVS.... KJ

## 2014-09-03 NOTE — Progress Notes (Signed)
  [facility] OFFICE PROGRESS NOTE   Diagnosis: Non-Hodgkin's lymphoma  INTERVAL HISTORY:   Ms. Rotert returns as scheduled. She feels well. No fever, night sweats, or anorexia. She continues every 2 month Remicade. No rectal bleeding. She has sinus drainage and "asthma ".  Objective:  Vital signs in last 24 hours:  Blood pressure 126/63, pulse 83, temperature 98.4 F (36.9 C), temperature source Oral, resp. rate 18, height 5' 4"  (1.626 m), weight 192 lb 6.4 oz (87.272 kg), SpO2 94.00%.    HEENT: Neck without mass Lymphatics: No cervical, supra-clavicular, axillary, or inguinal nodes Resp: Scattered inspiratory rhonchi and wheezes bilaterally at the upper chest. No respiratory distress Cardio: Regular rate and rhythm GI: No hepatosplenomegaly, nontender, no mass Vascular: No leg edema   Lab Results:  Lab Results  Component Value Date   WBC 4.4 09/03/2014   HGB 12.1 09/03/2014   HCT 37.4 09/03/2014   MCV 96.3 09/03/2014   PLT 342 09/03/2014   NEUTROABS 2.5 09/03/2014      Medications: I have reviewed the patient's current medications.  Assessment/Plan: 1. Non-Hodgkin lymphoma, low grade lymphoma involving the lungs, diagnosed in June 2006. No clinical evidence of progression. 2. Crohn's disease, followed by Dr. Wynetta Emery.She is taking Imuran and Remicade. 3. Post thoracotomy pain, improved with Elavil.  4. Anemia secondary to chronic disease and gastrointestinal bleeding. Hemoglobin is improved. 5. History of thrombocytosis. 6. History of anorexia/weight loss in the setting of active Crohn disease. Improved. 7. History of a serum monoclonal protein, stable on repeat serum protein electrophoresis studies, last in August 2014 8. History of asthma. She continues Advair.  Disposition:  Ms. Spellman remains in clinical remission from non-Hodgkin's lymphoma. We will followup on the serum protein electrophoresis from today. She will return for an office visit  in 6 months. She received an influenza vaccine today. She will discuss the 13 valent pneumococcal vaccine with Dr. Joya Gaskins.  [name], MD  09/03/2014  12:40 PM

## 2014-09-05 ENCOUNTER — Telehealth: Payer: Self-pay | Admitting: *Deleted

## 2014-09-05 LAB — PROTEIN ELECTROPHORESIS, SERUM
ALPHA-1-GLOBULIN: 9.3 % — AB (ref 2.9–4.9)
ALPHA-2-GLOBULIN: 9.6 % (ref 7.1–11.8)
Albumin ELP: 47.8 % — ABNORMAL LOW (ref 55.8–66.1)
Beta 2: 5.9 % (ref 3.2–6.5)
Beta Globulin: 6.9 % (ref 4.7–7.2)
Gamma Globulin: 20.5 % — ABNORMAL HIGH (ref 11.1–18.8)
M-SPIKE, %: 0.43 g/dL
TOTAL PROTEIN, SERUM ELECTROPHOR: 7.1 g/dL (ref 6.0–8.3)

## 2014-09-05 NOTE — Telephone Encounter (Signed)
Notified Anjalee of stable Mspike. Follow up as scheduled.

## 2014-09-05 NOTE — Telephone Encounter (Signed)
Message copied by Tania Ade on Wed Sep 05, 2014  1:45 PM ------      Message from: [name] B      Created: Wed Sep 05, 2014  1:15 PM       Please call patient, m-spike is stable ------

## 2014-09-07 ENCOUNTER — Other Ambulatory Visit: Payer: Medicare Other

## 2014-09-07 ENCOUNTER — Ambulatory Visit: Payer: Medicare Other | Admitting: Oncology

## 2014-09-11 ENCOUNTER — Other Ambulatory Visit: Payer: Self-pay | Admitting: Critical Care Medicine

## 2014-09-12 ENCOUNTER — Encounter (HOSPITAL_COMMUNITY): Payer: Self-pay

## 2014-09-12 ENCOUNTER — Encounter (HOSPITAL_COMMUNITY)
Admission: RE | Admit: 2014-09-12 | Discharge: 2014-09-12 | Disposition: A | Discharge status: Home / Self Care | Payer: Medicare Other | Source: Ambulatory Visit | Attending: Gastroenterology | Admitting: Gastroenterology

## 2014-09-12 DIAGNOSIS — Z5181 Encounter for therapeutic drug level monitoring: Secondary | ICD-10-CM | POA: Insufficient documentation

## 2014-09-12 DIAGNOSIS — K5090 Crohn's disease, unspecified, without complications: Secondary | ICD-10-CM | POA: Diagnosis not present

## 2014-09-12 MED ORDER — ACETAMINOPHEN 325 MG PO TABS
650.0000 mg | ORAL_TABLET | ORAL | Status: DC
  Administered 2014-09-12: 650 mg via ORAL
  Filled 2014-09-12: qty 2

## 2014-09-12 MED ORDER — SODIUM CHLORIDE 0.9 % IV SOLN
250.0000 mL | INTRAVENOUS | Status: DC
  Administered 2014-09-12: 250 mL via INTRAVENOUS

## 2014-09-12 MED ORDER — METHYLPREDNISOLONE SODIUM SUCC 125 MG IJ SOLR
20.0000 mg | INTRAMUSCULAR | Status: DC
  Administered 2014-09-12: 20 mg via INTRAVENOUS
  Filled 2014-09-12: qty 2

## 2014-09-12 MED ORDER — SODIUM CHLORIDE 0.9 % IV SOLN
400.0000 mg | INTRAVENOUS | Status: DC
  Administered 2014-09-12: 400 mg via INTRAVENOUS
  Filled 2014-09-12: qty 40

## 2014-09-18 ENCOUNTER — Other Ambulatory Visit: Payer: Self-pay | Admitting: Oncology

## 2014-09-18 ENCOUNTER — Other Ambulatory Visit (HOSPITAL_COMMUNITY): Payer: Self-pay | Admitting: Obstetrics & Gynecology

## 2014-09-18 DIAGNOSIS — Z1231 Encounter for screening mammogram for malignant neoplasm of breast: Secondary | ICD-10-CM

## 2014-10-22 ENCOUNTER — Encounter (HOSPITAL_COMMUNITY): Payer: Medicare Other

## 2014-10-22 ENCOUNTER — Ambulatory Visit (INDEPENDENT_AMBULATORY_CARE_PROVIDER_SITE_OTHER): Payer: Medicare Other | Admitting: Critical Care Medicine

## 2014-10-22 ENCOUNTER — Encounter: Payer: Self-pay | Admitting: Critical Care Medicine

## 2014-10-22 VITALS — BP 110/62 | HR 74 | Ht 64.0 in | Wt 205.0 lb

## 2014-10-22 DIAGNOSIS — J4541 Moderate persistent asthma with (acute) exacerbation: Secondary | ICD-10-CM

## 2014-10-22 DIAGNOSIS — J019 Acute sinusitis, unspecified: Secondary | ICD-10-CM

## 2014-10-22 DIAGNOSIS — Z23 Encounter for immunization: Secondary | ICD-10-CM

## 2014-10-22 DIAGNOSIS — J209 Acute bronchitis, unspecified: Secondary | ICD-10-CM

## 2014-10-22 MED ORDER — AZITHROMYCIN 250 MG PO TABS: ORAL_TABLET | ORAL | Status: DC

## 2014-10-22 MED ORDER — FLUTICASONE PROPIONATE 50 MCG/ACT NA SUSP: 2.0000 | Freq: Every day | NASAL | Status: DC

## 2014-10-22 NOTE — Assessment & Plan Note (Signed)
Persistent asthma with acute exacerbation secondary to sinusitis and bronchitis Plan Azithromycin 220m Take two once then one daily until gone, sent to pharmacy A prevnar 13 vaccine was given Fluticasone nasal two puff ea nostril daily, just use one cannister, you do not need to refill this  No other changes Return 4 months

## 2014-10-22 NOTE — Progress Notes (Signed)
Subjective:    Patient ID: [name], female    DOB: August 25, 1951, 63 y.o.   MRN: [id number]  HPI 63 y.o.  African-American female with moderate persistent asthma & obstructive sleep apnea .   10/22/2014 Chief Complaint  Patient presents with  . 6 month follow up    Has wheezing and cough with greenish yellow mucus x 1 wk.  No SOB, chest tightness/pain, or f/c/s.    Notes more cough with green mucus.  Notes more pndrip, notes more nasal congestion.  Notes some h/a and pressure in sinus x 2 weeks. No fever. No chest pain.   PUL ASTHMA HISTORY 10/22/2014 04/25/2014 10/10/2013 10/14/2012 09/25/2011  Symptoms Daily 0-2 days/week 0-2 days/week 0-2 days/week 0-2 days/week  Nighttime awakenings 0-2/month 0-2/month 0-2/month 0-2/month 0-2/month  Interference with activity No limitations No limitations No limitations No limitations No limitations  SABA use Daily 0-2 days/wk 0-2 days/wk 0-2 days/wk 0-2 days/wk  Exacerbations requiring oral steroids 0-1 / year 0-1 / year 0-1 / year 0-1 / year 0-1 / year   Review of Systems Constitutional:   No  weight loss, night sweats,  Fevers, chills, fatigue, lassitude. HEENT:   No headaches,  Difficulty swallowing,  Tooth/dental problems,  Sore throat,                No sneezing, itching, ear ache, +++nasal congestion, +++post nasal drip,   CV:  No chest pain,  Orthopnea, PND, swelling in lower extremities, anasarca, dizziness, palpitations  GI  No heartburn, indigestion, abdominal pain, nausea, vomiting, diarrhea, change in bowel habits, loss of appetite  Resp: Notes shortness of breath with exertion or at rest.  Notes excess mucus, notes  productive cough,  No non-productive cough,  No coughing up of blood.  Notes change in color of mucus.  No wheezing.  No chest wall deformity  Skin: no rash or lesions.  GU: no dysuria, change in color of urine, no urgency or frequency.  No flank pain.  MS:  No joint pain or swelling.  No decreased range of motion.   No back pain.  Psych:  No change in mood or affect. No depression or anxiety.  No memory loss.     Objective:   Physical Exam Filed Vitals:   10/22/14 1036  BP: 110/62  Pulse: 74  Height: 5' 4"  (1.626 m)  Weight: 205 lb (92.987 kg)  SpO2: 97%    Gen: Pleasant, well-nourished, in no distress,  normal affect  ENT: No lesions,  mouth clear,  nasal purulence and moderate postnasal drip   Neck: No JVD, no TMG, no carotid bruits  Lungs: No use of accessory muscles, no dullness to percussion, expired wheezes   Cardiovascular: RRR, heart sounds normal, no murmur or gallops, no peripheral edema  Abdomen: soft and NT, no HSM,  BS normal  Musculoskeletal: No deformities, no cyanosis or clubbing  Neuro: alert, non focal  Skin: Warm, no lesions or rashes     Assessment & Plan:   Moderate persistent asthma with acute bronchitis and acute exacerbation Persistent asthma with acute exacerbation secondary to sinusitis and bronchitis Plan Azithromycin 298m Take two once then one daily until gone, sent to pharmacy A prevnar 13 vaccine was given Fluticasone nasal two puff ea nostril daily, just use one cannister, you do not need to refill this  No other changes Return 4 months     Updated Medication List Outpatient Encounter Prescriptions as of 10/22/2014  Medication Sig  . amitriptyline (ELAVIL) 50  MG tablet TAKE ONE TABLET BY MOUTH AT BEDTIME  . aspirin EC 81 MG tablet Take 81 mg by mouth daily.  . Calcium Carbonate-Vitamin D (CALCIUM 600 + D PO) Take 1 tablet by mouth daily.   . Canagliflozin (INVOKANA) 300 MG TABS Take 300 mg by mouth daily.  [name] estradiol (ESTRACE) 1 MG tablet Take 1 mg by mouth daily.  . fluconazole (DIFLUCAN) 100 MG tablet Take 100 mg by mouth every 7 (seven) days.  . Fluticasone-Salmeterol (ADVAIR DISKUS) 100-50 MCG/DOSE AEPB inhale 1 dose by mouth twice a day  . InFLIXimab (REMICADE IV) Inject into the vein every 8 (eight) weeks. For Crohn's  .  montelukast (SINGULAIR) 10 MG tablet Take 1 tablet (10 mg total) by mouth at bedtime.  . Multiple Vitamin (MULTIVITAMIN WITH MINERALS) TABS Take 1 tablet by mouth daily.  [name] olmesartan-hydrochlorothiazide (BENICAR HCT) 20-12.5 MG per tablet Take 1 tablet by mouth daily.    [name] omeprazole (PRILOSEC) 20 MG capsule Take 1 capsule by mouth    daily .  . rosuvastatin (CRESTOR) 20 MG tablet Take 20 mg by mouth daily.  . sertraline (ZOLOFT) 100 MG tablet Take 100 mg by mouth daily.    [name] azithromycin (ZITHROMAX) 250 MG tablet Take two once then one daily until gone  . fluticasone (FLONASE) 50 MCG/ACT nasal spray Place 2 sprays into both nostrils daily.  . [DISCONTINUED] azaTHIOprine (IMURAN) 50 MG tablet Take 150 mg by mouth daily.

## 2014-10-22 NOTE — Patient Instructions (Addendum)
Azithromycin 24m Take two once then one daily until gone, sent to pharmacy A prevnar 13 vaccine was given Fluticasone nasal two puff ea nostril daily, just use one cannister, you do not need to refill this  No other changes Return 4 months

## 2014-10-24 ENCOUNTER — Ambulatory Visit (HOSPITAL_COMMUNITY)
Admission: RE | Admit: 2014-10-24 | Discharge: 2014-10-24 | Disposition: A | Discharge status: Home / Self Care | Payer: Medicare Other | Source: Ambulatory Visit | Attending: Obstetrics & Gynecology | Admitting: Obstetrics & Gynecology

## 2014-10-24 DIAGNOSIS — Z1231 Encounter for screening mammogram for malignant neoplasm of breast: Secondary | ICD-10-CM | POA: Insufficient documentation

## 2014-11-08 ENCOUNTER — Encounter (HOSPITAL_COMMUNITY): Payer: Self-pay

## 2014-11-08 ENCOUNTER — Encounter (HOSPITAL_COMMUNITY)
Admission: RE | Admit: 2014-11-08 | Discharge: 2014-11-08 | Disposition: A | Discharge status: Home / Self Care | Payer: Medicare Other | Source: Ambulatory Visit | Attending: Gastroenterology | Admitting: Gastroenterology

## 2014-11-08 DIAGNOSIS — K5090 Crohn's disease, unspecified, without complications: Secondary | ICD-10-CM | POA: Diagnosis not present

## 2014-11-08 DIAGNOSIS — Z5181 Encounter for therapeutic drug level monitoring: Secondary | ICD-10-CM | POA: Diagnosis not present

## 2014-11-08 MED ORDER — METHYLPREDNISOLONE SODIUM SUCC 125 MG IJ SOLR
20.0000 mg | INTRAMUSCULAR | Status: AC
  Administered 2014-11-08: 08:00:00 via INTRAVENOUS
  Filled 2014-11-08: qty 0.32

## 2014-11-08 MED ORDER — SODIUM CHLORIDE 0.9 % IV SOLN
250.0000 mL | INTRAVENOUS | Status: DC
  Administered 2014-11-08: 250 mL via INTRAVENOUS

## 2014-11-08 MED ORDER — SODIUM CHLORIDE 0.9 % IV SOLN
400.0000 mg | INTRAVENOUS | Status: AC
  Administered 2014-11-08: 400 mg via INTRAVENOUS
  Filled 2014-11-08: qty 40

## 2014-11-08 MED ORDER — ACETAMINOPHEN 325 MG PO TABS
650.0000 mg | ORAL_TABLET | ORAL | Status: AC
  Administered 2014-11-08: 650 mg via ORAL
  Filled 2014-11-08: qty 2

## 2014-11-08 NOTE — Progress Notes (Signed)
Tolerated remicade treatment w/o problems

## 2014-11-14 ENCOUNTER — Encounter (HOSPITAL_COMMUNITY): Payer: Self-pay | Admitting: *Deleted

## 2014-11-14 ENCOUNTER — Emergency Department (HOSPITAL_COMMUNITY)
Admission: EM | Admit: 2014-11-14 | Discharge: 2014-11-14 | Disposition: A | Discharge status: Home / Self Care | Payer: Medicare Other | Attending: Emergency Medicine | Admitting: Emergency Medicine

## 2014-11-14 DIAGNOSIS — Z7951 Long term (current) use of inhaled steroids: Secondary | ICD-10-CM | POA: Diagnosis not present

## 2014-11-14 DIAGNOSIS — J45909 Unspecified asthma, uncomplicated: Secondary | ICD-10-CM | POA: Insufficient documentation

## 2014-11-14 DIAGNOSIS — R42 Dizziness and giddiness: Secondary | ICD-10-CM | POA: Insufficient documentation

## 2014-11-14 DIAGNOSIS — Z8572 Personal history of non-Hodgkin lymphomas: Secondary | ICD-10-CM | POA: Diagnosis not present

## 2014-11-14 DIAGNOSIS — Z792 Long term (current) use of antibiotics: Secondary | ICD-10-CM | POA: Insufficient documentation

## 2014-11-14 DIAGNOSIS — Z7982 Long term (current) use of aspirin: Secondary | ICD-10-CM | POA: Insufficient documentation

## 2014-11-14 DIAGNOSIS — I10 Essential (primary) hypertension: Secondary | ICD-10-CM | POA: Insufficient documentation

## 2014-11-14 DIAGNOSIS — E871 Hypo-osmolality and hyponatremia: Secondary | ICD-10-CM | POA: Diagnosis not present

## 2014-11-14 DIAGNOSIS — E119 Type 2 diabetes mellitus without complications: Secondary | ICD-10-CM | POA: Insufficient documentation

## 2014-11-14 DIAGNOSIS — Z862 Personal history of diseases of the blood and blood-forming organs and certain disorders involving the immune mechanism: Secondary | ICD-10-CM | POA: Insufficient documentation

## 2014-11-14 DIAGNOSIS — K5090 Crohn's disease, unspecified, without complications: Secondary | ICD-10-CM | POA: Diagnosis not present

## 2014-11-14 DIAGNOSIS — Z79899 Other long term (current) drug therapy: Secondary | ICD-10-CM | POA: Diagnosis not present

## 2014-11-14 LAB — CBC WITH DIFFERENTIAL/PLATELET
BASOS PCT: 1 % (ref 0–1)
Basophils Absolute: 0 10*3/uL (ref 0.0–0.1)
Eosinophils Absolute: 0.3 10*3/uL (ref 0.0–0.7)
Eosinophils Relative: 4 % (ref 0–5)
HCT: 38.5 % (ref 36.0–46.0)
HEMOGLOBIN: 12.8 g/dL (ref 12.0–15.0)
Lymphocytes Relative: 36 % (ref 12–46)
Lymphs Abs: 2.2 10*3/uL (ref 0.7–4.0)
MCH: 32.2 pg (ref 26.0–34.0)
MCHC: 33.2 g/dL (ref 30.0–36.0)
MCV: 96.7 fL (ref 78.0–100.0)
Monocytes Absolute: 0.6 10*3/uL (ref 0.1–1.0)
Monocytes Relative: 9 % (ref 3–12)
NEUTROS ABS: 3 10*3/uL (ref 1.7–7.7)
NEUTROS PCT: 50 % (ref 43–77)
Platelets: 269 10*3/uL (ref 150–400)
RBC: 3.98 MIL/uL (ref 3.87–5.11)
RDW: 14.1 % (ref 11.5–15.5)
WBC: 6.2 10*3/uL (ref 4.0–10.5)

## 2014-11-14 LAB — BASIC METABOLIC PANEL
Anion gap: 9 (ref 5–15)
BUN: 18 mg/dL (ref 6–23)
CHLORIDE: 98 meq/L (ref 96–112)
CO2: 24 mmol/L (ref 19–32)
CREATININE: 0.9 mg/dL (ref 0.50–1.10)
Calcium: 8.5 mg/dL (ref 8.4–10.5)
GFR calc non Af Amer: 66 mL/min — ABNORMAL LOW (ref >90)
GFR, EST AFRICAN AMERICAN: 76 mL/min — AB (ref >90)
Glucose, Bld: 195 mg/dL — ABNORMAL HIGH (ref 70–99)
POTASSIUM: 4 mmol/L (ref 3.5–5.1)
SODIUM: 131 mmol/L — AB (ref 135–145)

## 2014-11-14 LAB — CBG MONITORING, ED: Glucose-Capillary: 194 mg/dL — ABNORMAL HIGH (ref 70–99)

## 2014-11-14 MED ORDER — MECLIZINE HCL 25 MG PO TABS
25.0000 mg | ORAL_TABLET | Freq: Once | ORAL | Status: AC
  Administered 2014-11-14: 25 mg via ORAL
  Filled 2014-11-14: qty 1

## 2014-11-14 MED ORDER — SODIUM CHLORIDE 0.9 % IV BOLUS (SEPSIS)
500.0000 mL | Freq: Once | INTRAVENOUS | Status: AC
  Administered 2014-11-14: 500 mL via INTRAVENOUS

## 2014-11-14 MED ORDER — MECLIZINE HCL 25 MG PO TABS: 25.0000 mg | ORAL_TABLET | Freq: Three times a day (TID) | ORAL | Status: DC | PRN

## 2014-11-14 NOTE — ED Notes (Signed)
Bed: WA08 Expected date:  Expected time:  Means of arrival:  Comments: Ems- 63 yo F, dizziness

## 2014-11-14 NOTE — ED Provider Notes (Signed)
CSN: [id number]     Arrival date & time 11/14/14  1549 History   First MD Initiated Contact with Patient 11/14/14 1602     Chief Complaint  Patient presents with  . Dizziness      Patient is a 62 y.o. female presenting with dizziness. The history is provided by the patient. No language interpreter was used.  Dizziness  Ms. Furuta presents for evaluation of dizziness.  She started feeling dizzy after lunch (about 230/3pm).  Sxs are described as vertiginous.  Sxs wax and wane with movement.  If she keeps her head still the symptoms resolve. She has associated nausea, no vomiting.  No fevers, chest pain, sob, HA, abdominal pain.  No recent medication changes.  She has a hx/o similar sxs previously and they were related to vertigo.  Sxs previously resolved with antivert.  Sxs are moderate, intermittent, and improving.  She has a history of Crohn's disease and diabetes.  Past Medical History  Diagnosis Date  . Allergic rhinitis   . Asthma   . Hypertension   . Small cell b-cell lymphoma     R supra hilar mass- Needle Bx positive 2006  . Crohn's disease   . HTN (hypertension)   . Chronic anemia   . Type 2 diabetes mellitus   . Sleep apnea     pt denies this  . Bilateral sciatica     nothing recently 4/`/15   Past Surgical History  Procedure Laterality Date  . Vesicovaginal fistula closure w/ tah  May 1988  . Abdominal hysterectomy  1988   Family History  Problem Relation Age of Onset  . Diabetes Mother   . Heart attack Father   . Liver cancer Mother    History  Substance Use Topics  . Smoking status: Never Smoker   . Smokeless tobacco: Never Used  . Alcohol Use: No   OB History    No data available     Review of Systems  Neurological: Positive for dizziness.  All other systems reviewed and are negative.     Allergies  Review of patient's allergies indicates no known allergies.  Home Medications   Prior to Admission medications   Medication Sig Start Date End  Date Taking? Authorizing Provider  amitriptyline (ELAVIL) 50 MG tablet TAKE ONE TABLET BY MOUTH AT BEDTIME 09/18/14   Ladell Pier, MD  aspirin EC 81 MG tablet Take 81 mg by mouth daily.    Historical Provider, MD  azithromycin (ZITHROMAX) 250 MG tablet Take two once then one daily until gone 10/22/14   Elsie Stain, MD  Calcium Carbonate-Vitamin D (CALCIUM 600 + D PO) Take 1 tablet by mouth daily.     Historical Provider, MD  Canagliflozin (INVOKANA) 300 MG TABS Take 300 mg by mouth daily.    Historical Provider, MD  estradiol (ESTRACE) 1 MG tablet Take 1 mg by mouth daily.    Historical Provider, MD  fluconazole (DIFLUCAN) 100 MG tablet Take 100 mg by mouth every 7 (seven) days.    Historical Provider, MD  fluticasone (FLONASE) 50 MCG/ACT nasal spray Place 2 sprays into both nostrils daily. 10/22/14   Elsie Stain, MD  Fluticasone-Salmeterol (ADVAIR DISKUS) 100-50 MCG/DOSE AEPB inhale 1 dose by mouth twice a day 04/25/14   Elsie Stain, MD  InFLIXimab (REMICADE IV) Inject into the vein every 8 (eight) weeks. For Crohn's    Historical Provider, MD  montelukast (SINGULAIR) 10 MG tablet Take 1 tablet (10 mg total) by  mouth at bedtime. 04/25/14   Elsie Stain, MD  Multiple Vitamin (MULTIVITAMIN WITH MINERALS) TABS Take 1 tablet by mouth daily.    Historical Provider, MD  olmesartan-hydrochlorothiazide (BENICAR HCT) 20-12.5 MG per tablet Take 1 tablet by mouth daily.      Historical Provider, MD  omeprazole (PRILOSEC) 20 MG capsule Take 1 capsule by mouth    daily . 09/11/14   Elsie Stain, MD  rosuvastatin (CRESTOR) 20 MG tablet Take 20 mg by mouth daily. 05/10/13   Historical Provider, MD  sertraline (ZOLOFT) 100 MG tablet Take 100 mg by mouth daily.      Historical Provider, MD   BP 113/51 mmHg  Pulse 76  Temp(Src) 98 F (36.7 C) (Oral)  Resp 20  SpO2 97% Physical Exam  Constitutional: She is oriented to person, place, and time. She appears well-developed and  well-nourished.  HENT:  Head: Normocephalic and atraumatic.  Right Ear: External ear normal.  Left Ear: External ear normal.  Mouth/Throat: Oropharynx is clear and moist.  Eyes: EOM are normal. Pupils are equal, round, and reactive to light.  Cardiovascular: Normal rate and regular rhythm.   No murmur heard. Pulmonary/Chest: Effort normal and breath sounds normal. No respiratory distress.  Abdominal: Soft. There is no tenderness. There is no rebound and no guarding.  Musculoskeletal: She exhibits no edema or tenderness.  Neurological: She is alert and oriented to person, place, and time. No cranial nerve deficit. Coordination normal.  Skin: Skin is warm and dry.  Psychiatric: She has a normal mood and affect. Her behavior is normal.  Nursing note and vitals reviewed.   ED Course  Procedures (including critical care time) Labs Review Labs Reviewed  BASIC METABOLIC PANEL - Abnormal; Notable for the following:    Sodium 131 (*)    Glucose, Bld 195 (*)    GFR calc non Af Amer 66 (*)    GFR calc Af Amer 76 (*)    All other components within normal limits  CBG MONITORING, ED - Abnormal; Notable for the following:    Glucose-Capillary 194 (*)    All other components within normal limits  CBC WITH DIFFERENTIAL    Imaging Review No results found.   EKG Interpretation None      MDM   Final diagnoses:  Vertigo  Hyponatremia    Patient presents for evaluation of vertigo. On exam patient is neurologically intact and her symptoms are aren't improving prior to emergency department evaluation. Vertigo is not reliably reproducible this time, the patient by history he describes benign positional vertigo. Clinical picture is not consistent with acute CVA or TIA or subarachnoid hemorrhage. Patient is noted to be mildly hyponatremic, patient provided a small fluid bolus. Discussed with patient PCP follow-up for hyponatremia and vertigo. Patient was provided prescription for meclizine. On  recheck in the department her vertigo is completely resolved.    Quintella Reichert, MD 11/14/14 [phone]

## 2014-11-14 NOTE — ED Notes (Signed)
Per EMS was out shopping today, got home around 1400, at 1430 had sudden onset of dizziness, with nausea, dizziness worse with position changes. Has history of vertigo.  Had 4 mg Zofran IV enroute

## 2014-11-14 NOTE — Progress Notes (Signed)
CSW met with patient at bedside. There was no family present. Per chart, Patient has a history of Vertigo.   Patient states that she felt dizzy today. Patient says that she did not fall, but set in her chair once the dizzy spell began. Patient states this does not happen often. However, she admits that this has happened before about a year ago.  Patient states she feels a little better than when she initially came into the ED. Patient informed CSW that she lives at home with her husband. Patient also states that she is usually able to complete her ADL's independently.   Willette Brace [phone] ED CSW 11/14/2014 5:55 PM

## 2014-11-14 NOTE — Discharge Instructions (Signed)
Your sodium was slightly low today please get this rechecked by her doctor in the next week.   Dizziness Dizziness is a common problem. It is a feeling of unsteadiness or light-headedness. You may feel like you are about to faint. Dizziness can lead to injury if you stumble or fall. A person of any age group can suffer from dizziness, but dizziness is more common in older adults. CAUSES  Dizziness can be caused by many different things, including:  Middle ear problems.  Standing for too long.  Infections.  An allergic reaction.  Aging.  An emotional response to something, such as the sight of blood.  Side effects of medicines.  Tiredness.  Problems with circulation or blood pressure.  Excessive use of alcohol or medicines, or illegal drug use.  Breathing too fast (hyperventilation).  An irregular heart rhythm (arrhythmia).  A low red blood cell count (anemia).  Pregnancy.  Vomiting, diarrhea, fever, or other illnesses that cause body fluid loss (dehydration).  Diseases or conditions such as Parkinson's disease, high blood pressure (hypertension), diabetes, and thyroid problems.  Exposure to extreme heat. DIAGNOSIS  Your health care provider will ask about your symptoms, perform a physical exam, and perform an electrocardiogram (ECG) to record the electrical activity of your heart. Your health care provider may also perform other heart or blood tests to determine the cause of your dizziness. These may include:  Transthoracic echocardiogram (TTE). During echocardiography, sound waves are used to evaluate how blood flows through your heart.  Transesophageal echocardiogram (TEE).  Cardiac monitoring. This allows your health care provider to monitor your heart rate and rhythm in real time.  Holter monitor. This is a portable device that records your heartbeat and can help diagnose heart arrhythmias. It allows your health care provider to track your heart activity for  several days if needed.  Stress tests by exercise or by giving medicine that makes the heart beat faster. TREATMENT  Treatment of dizziness depends on the cause of your symptoms and can vary greatly. HOME CARE INSTRUCTIONS   Drink enough fluids to keep your urine clear or pale yellow. This is especially important in very hot weather. In older adults, it is also important in cold weather.  Take your medicine exactly as directed if your dizziness is caused by medicines. When taking blood pressure medicines, it is especially important to get up slowly.  Rise slowly from chairs and steady yourself until you feel okay.  In the morning, first sit up on the side of the bed. When you feel okay, stand slowly while holding onto something until you know your balance is fine.  Move your legs often if you need to stand in one place for a long time. Tighten and relax your muscles in your legs while standing.  Have someone stay with you for 1-2 days if dizziness continues to be a problem. Do this until you feel you are well enough to stay alone. Have the person call your health care provider if he or she notices changes in you that are concerning.  Do not drive or use heavy machinery if you feel dizzy.  Do not drink alcohol. SEEK IMMEDIATE MEDICAL CARE IF:   Your dizziness or light-headedness gets worse.  You feel nauseous or vomit.  You have problems talking, walking, or using your arms, hands, or legs.  You feel weak.  You are not thinking clearly or you have trouble forming sentences. It may take a friend or family member to notice this.  You have chest pain, abdominal pain, shortness of breath, or sweating.  Your vision changes.  You notice any bleeding.  You have side effects from medicine that seems to be getting worse rather than better. MAKE SURE YOU:   Understand these instructions.  Will watch your condition.  Will get help right away if you are not doing well or get  worse. Document Released: 05/05/2001 Document Revised: 11/14/2013 Document Reviewed: 05/29/2011 [facility] Patient Information 2015 [place], [place]. This information is not intended to replace advice given to you by your health care provider. Make sure you discuss any questions you have with your health care provider.  Hyponatremia  Hyponatremia is when the amount of salt (sodium) in your blood is too low. When sodium levels are low, your cells will absorb extra water and swell. The swelling happens throughout the body, but it mostly affects the brain. Severe brain swelling (cerebral edema), seizures, or coma can happen.  CAUSES   Heart, kidney, or liver problems.  Thyroid problems.  Adrenal gland problems.  Severe vomiting and diarrhea.  Certain medicines or illegal drugs.  Dehydration.  Drinking too much water.  Low-sodium diet. SYMPTOMS   Nausea and vomiting.  Confusion.  Lethargy.  Agitation.  Headache.  Twitching or shaking (seizures).  Unconsciousness.  Appetite loss.  Muscle weakness and cramping. DIAGNOSIS  Hyponatremia is identified by a simple blood test. Your caregiver will perform a history and physical exam to try to find the cause and type of hyponatremia. Other tests may be needed to measure the amount of sodium in your blood and urine. TREATMENT  Treatment will depend on the cause.   Fluids may be given through the vein (IV).  Medicines may be used to correct the sodium imbalance. If medicines are causing the problem, they will need to be adjusted.  Water or fluid intake may be restricted to restore proper balance. The speed of correcting the sodium problem is very important. If the problem is corrected too fast, nerve damage (sometimes unchangeable) can happen. HOME CARE INSTRUCTIONS   Only take medicines as directed by your caregiver. Many medicines can make hyponatremia worse. Discuss all your medicines with your caregiver.  Carefully follow  any recommended diet, including any fluid restrictions.  You may be asked to repeat lab tests. Follow these directions.  Avoid alcohol and recreational drugs. SEEK MEDICAL CARE IF:   You develop worsening nausea, fatigue, headache, confusion, or weakness.  Your original hyponatremia symptoms return.  You have problems following the recommended diet. SEEK IMMEDIATE MEDICAL CARE IF:   You have a seizure.  You faint.  You have ongoing diarrhea or vomiting. MAKE SURE YOU:   Understand these instructions.  Will watch your condition.  Will get help right away if you are not doing well or get worse. Document Released: 10/30/2002 Document Revised: 02/01/2012 Document Reviewed: 04/26/2011 [facility] Patient Information 2015 [place], [place]. This information is not intended to replace advice given to you by your health care provider. Make sure you discuss any questions you have with your health care provider.

## 2015-01-02 ENCOUNTER — Encounter (HOSPITAL_COMMUNITY)
Admission: RE | Admit: 2015-01-02 | Discharge: 2015-01-02 | Disposition: A | Discharge status: Home / Self Care | Payer: Medicare Other | Source: Ambulatory Visit | Attending: Gastroenterology | Admitting: Gastroenterology

## 2015-01-02 DIAGNOSIS — K5090 Crohn's disease, unspecified, without complications: Secondary | ICD-10-CM | POA: Insufficient documentation

## 2015-01-02 DIAGNOSIS — Z5181 Encounter for therapeutic drug level monitoring: Secondary | ICD-10-CM | POA: Insufficient documentation

## 2015-02-07 ENCOUNTER — Other Ambulatory Visit: Payer: Self-pay

## 2015-02-08 LAB — CYTOLOGY - PAP

## 2015-02-20 ENCOUNTER — Encounter (INDEPENDENT_AMBULATORY_CARE_PROVIDER_SITE_OTHER): Payer: Self-pay

## 2015-02-20 ENCOUNTER — Encounter: Payer: Self-pay | Admitting: Critical Care Medicine

## 2015-02-20 ENCOUNTER — Ambulatory Visit (INDEPENDENT_AMBULATORY_CARE_PROVIDER_SITE_OTHER): Payer: Medicare Other | Admitting: Critical Care Medicine

## 2015-02-20 VITALS — BP 118/70 | HR 82 | Ht 64.0 in | Wt 205.0 lb

## 2015-02-20 DIAGNOSIS — K219 Gastro-esophageal reflux disease without esophagitis: Secondary | ICD-10-CM | POA: Diagnosis not present

## 2015-02-20 DIAGNOSIS — J4540 Moderate persistent asthma, uncomplicated: Secondary | ICD-10-CM

## 2015-02-20 MED ORDER — FLUTICASONE PROPIONATE 50 MCG/ACT NA SUSP: 2.0000 | Freq: Every day | NASAL | Status: AC

## 2015-02-20 MED ORDER — ALBUTEROL SULFATE HFA 108 (90 BASE) MCG/ACT IN AERS: 2.0000 | INHALATION_SPRAY | Freq: Four times a day (QID) | RESPIRATORY_TRACT | Status: DC | PRN

## 2015-02-20 MED ORDER — FLUTICASONE-SALMETEROL 100-50 MCG/DOSE IN AEPB: INHALATION_SPRAY | RESPIRATORY_TRACT | Status: DC

## 2015-02-20 NOTE — Patient Instructions (Signed)
No change in medications. Return in        6 months        

## 2015-02-20 NOTE — Progress Notes (Signed)
Subjective:    Patient ID: [name], female    DOB: 07/27/1951, 64 y.o.   MRN: [id number]  HPI 64 y.o.  African-American female with moderate persistent asthma & obstructive sleep apnea .   02/20/2015 Chief Complaint  Patient presents with  . Follow-up    asthma; cough; pt states she is feeling well   Notes min dry cough  Pt denies any significant sore throat, nasal congestion or excess secretions, fever, chills, sweats, unintended weight loss, pleurtic or exertional chest pain, orthopnea PND, or leg swelling Pt denies any increase in rescue therapy over baseline, denies waking up needing it or having any early am or nocturnal exacerbations of coughing/wheezing/or dyspnea. Pt also denies any obvious fluctuation in symptoms with  weather or environmental change or other alleviating or aggravating factors  Pt has daily GERD.     PUL ASTHMA HISTORY 02/20/2015 10/22/2014 04/25/2014 10/10/2013 10/14/2012 09/25/2011  Symptoms 0-2 days/week Daily 0-2 days/week 0-2 days/week 0-2 days/week 0-2 days/week  Nighttime awakenings 0-2/month 0-2/month 0-2/month 0-2/month 0-2/month 0-2/month  Interference with activity No limitations No limitations No limitations No limitations No limitations No limitations  SABA use 0-2 days/wk Daily 0-2 days/wk 0-2 days/wk 0-2 days/wk 0-2 days/wk  Exacerbations requiring oral steroids 0-1 / year 0-1 / year 0-1 / year 0-1 / year 0-1 / year 0-1 / year   Review of Systems Constitutional:   No  weight loss, night sweats,  Fevers, chills, fatigue, lassitude. HEENT:   No headaches,  Difficulty swallowing,  Tooth/dental problems,  Sore throat,                No sneezing, itching, ear ache, +++nasal congestion, +++post nasal drip,   CV:  No chest pain,  Orthopnea, PND, swelling in lower extremities, anasarca, dizziness, palpitations  GI  Notes  heartburn, notes indigestion, abdominal pain, nausea, vomiting, diarrhea, change in bowel habits, loss of appetite  Resp:  Notes shortness of breath with exertion or at rest.  Notes excess mucus, notes  productive cough,  No non-productive cough,  No coughing up of blood.  Notes change in color of mucus.  No wheezing.  No chest wall deformity  Skin: no rash or lesions.  GU: no dysuria, change in color of urine, no urgency or frequency.  No flank pain.  MS:  No joint pain or swelling.  No decreased range of motion.  No back pain.  Psych:  No change in mood or affect. No depression or anxiety.  No memory loss.     Objective:   Physical Exam Filed Vitals:   02/20/15 1051  BP: 118/70  Pulse: 82  Height: 5' 4"  (1.626 m)  Weight: 205 lb (92.987 kg)  SpO2: 95%    Gen: Pleasant, well-nourished, in no distress,  normal affect  ENT: No lesions,  mouth clear,  nasal purulence and moderate postnasal drip   Neck: No JVD, no TMG, no carotid bruits  Lungs: No use of accessory muscles, no dullness to percussion, expired wheezes   Cardiovascular: RRR, heart sounds normal, no murmur or gallops, no peripheral edema  Abdomen: soft and NT, no HSM,  BS normal  Musculoskeletal: No deformities, no cyanosis or clubbing  Neuro: alert, non focal  Skin: Warm, no lesions or rashes     Assessment & Plan:   Moderate persistent asthma in adult without complication GERD features Mod persistent asthma stable at present Plan Cont inhaled meds Cont ppi gerd diet given rov 6 months     Updated  Medication List Outpatient Encounter Prescriptions as of 02/20/2015  Medication Sig  . acetaminophen (TYLENOL ARTHRITIS PAIN) 650 MG CR tablet Take 1,300 mg by mouth every 8 (eight) hours as needed for pain.  [name] amitriptyline (ELAVIL) 50 MG tablet TAKE ONE TABLET BY MOUTH AT BEDTIME  . aspirin EC 81 MG tablet Take 81 mg by mouth daily.  . Calcium Carbonate-Vitamin D (CALCIUM 600 + D PO) Take 1 tablet by mouth daily.   . Canagliflozin (INVOKANA) 300 MG TABS Take 300 mg by mouth daily.  [name] estradiol (ESTRACE) 1 MG tablet Take  1 mg by mouth daily.  . fluconazole (DIFLUCAN) 100 MG tablet Take 100 mg by mouth every 7 (seven) days.  . fluticasone (FLONASE) 50 MCG/ACT nasal spray Place 2 sprays into both nostrils daily.  . Fluticasone-Salmeterol (ADVAIR DISKUS) 100-50 MCG/DOSE AEPB inhale 1 dose by mouth twice a day  . InFLIXimab (REMICADE IV) Inject into the vein every 8 (eight) weeks. For Crohn's  . montelukast (SINGULAIR) 10 MG tablet Take 1 tablet (10 mg total) by mouth at bedtime.  . Multiple Vitamin (MULTIVITAMIN WITH MINERALS) TABS Take 1 tablet by mouth daily.  [name] olmesartan-hydrochlorothiazide (BENICAR HCT) 20-12.5 MG per tablet Take 1 tablet by mouth daily.    [name] omeprazole (PRILOSEC) 20 MG capsule Take 1 capsule by mouth    daily .  [name] OVER THE COUNTER MEDICATION Take 2 tablets by mouth daily. Sinus Medication.  . rosuvastatin (CRESTOR) 20 MG tablet Take 20 mg by mouth daily.  . sertraline (ZOLOFT) 100 MG tablet Take 100 mg by mouth daily.    . [DISCONTINUED] fluticasone (FLONASE) 50 MCG/ACT nasal spray Place 2 sprays into both nostrils daily.  . [DISCONTINUED] Fluticasone-Salmeterol (ADVAIR DISKUS) 100-50 MCG/DOSE AEPB inhale 1 dose by mouth twice a day  . albuterol (PROAIR HFA) 108 (90 BASE) MCG/ACT inhaler Inhale 2 puffs into the lungs every 6 (six) hours as needed for wheezing or shortness of breath.  . [DISCONTINUED] azithromycin (ZITHROMAX) 250 MG tablet Take two once then one daily until gone (Patient not taking: Reported on 11/14/2014)  . [DISCONTINUED] meclizine (ANTIVERT) 25 MG tablet Take 1 tablet (25 mg total) by mouth 3 (three) times daily as needed. (Patient not taking: Reported on 02/20/2015)

## 2015-02-21 NOTE — Assessment & Plan Note (Signed)
Mod persistent asthma stable at present Plan Cont inhaled meds rov 6 months

## 2015-02-28 ENCOUNTER — Encounter (HOSPITAL_COMMUNITY): Payer: Self-pay

## 2015-02-28 ENCOUNTER — Encounter (HOSPITAL_COMMUNITY)
Admission: RE | Admit: 2015-02-28 | Discharge: 2015-02-28 | Disposition: A | Discharge status: Home / Self Care | Payer: Medicare Other | Source: Ambulatory Visit | Attending: Gastroenterology | Admitting: Gastroenterology

## 2015-02-28 DIAGNOSIS — Z5181 Encounter for therapeutic drug level monitoring: Secondary | ICD-10-CM | POA: Insufficient documentation

## 2015-02-28 DIAGNOSIS — K5090 Crohn's disease, unspecified, without complications: Secondary | ICD-10-CM | POA: Insufficient documentation

## 2015-02-28 MED ORDER — ACETAMINOPHEN 325 MG PO TABS
650.0000 mg | ORAL_TABLET | ORAL | Status: DC
  Administered 2015-02-28: 650 mg via ORAL
  Filled 2015-02-28: qty 2

## 2015-02-28 MED ORDER — SODIUM CHLORIDE 0.9 % IV SOLN
400.0000 mg | INTRAVENOUS | Status: DC
  Administered 2015-02-28: 400 mg via INTRAVENOUS
  Filled 2015-02-28: qty 40

## 2015-02-28 MED ORDER — SODIUM CHLORIDE 0.9 % IV SOLN
INTRAVENOUS | Status: DC
  Administered 2015-02-28: 08:00:00 via INTRAVENOUS

## 2015-02-28 MED ORDER — METHYLPREDNISOLONE SODIUM SUCC 40 MG IJ SOLR
20.0000 mg | INTRAMUSCULAR | Status: DC
  Administered 2015-02-28: 20 mg via INTRAVENOUS
  Filled 2015-02-28 (×2): qty 1

## 2015-02-28 NOTE — Discharge Instructions (Signed)
Infliximab injection What is this medicine? INFLIXIMAB (in [place] i mab) is used to treat Crohn's disease and ulcerative colitis. It is also used to treat ankylosing spondylitis, psoriasis, and some forms of arthritis. This medicine may be used for other purposes; ask your health care provider or pharmacist if you have questions. COMMON BRAND NAME(S): Remicade What should I tell my health care provider before I take this medicine? They need to know if you have any of these conditions: -diabetes -exposure to tuberculosis -heart failure -hepatitis or liver disease -immune system problems -infection -lung or breathing disease, like COPD -multiple sclerosis -current or past resident of [place] or [place] -seizure disorder -an unusual or allergic reaction to infliximab, mouse proteins, other medicines, foods, dyes, or preservatives -pregnant or trying to get pregnant -breast-feeding How should I use this medicine? This medicine is for injection into a vein. It is usually given by a health care professional in a hospital or clinic setting. A special MedGuide will be given to you by the pharmacist with each prescription and refill. Be sure to read this information carefully each time. Talk to your pediatrician regarding the use of this medicine in children. Special care may be needed. Overdosage: If you think you have taken too much of this medicine contact a poison control center or emergency room at once. NOTE: This medicine is only for you. Do not share this medicine with others. What if I miss a dose? It is important not to miss your dose. Call your doctor or health care professional if you are unable to keep an appointment. What may interact with this medicine? Do not take this medicine with any of the following medications: -anakinra -rilonacept This medicine may also interact with the following medications: -vaccines This list may not describe all possible interactions.  Give your health care provider a list of all the medicines, herbs, non-prescription drugs, or dietary supplements you use. Also tell them if you smoke, drink alcohol, or use illegal drugs. Some items may interact with your medicine. What should I watch for while using this medicine? Visit your doctor or health care professional for regular checks on your progress. If you get a cold or other infection while receiving this medicine, call your doctor or health care professional. Do not treat yourself. This medicine may decrease your body's ability to fight infections. Before beginning therapy, your doctor may do a test to see if you have been exposed to tuberculosis. This medicine may make the symptoms of heart failure worse in some patients. If you notice symptoms such as increased shortness of breath or swelling of the ankles or legs, contact your health care provider right away. If you are going to have surgery or dental work, tell your health care professional or dentist that you have received this medicine. If you take this medicine for plaque psoriasis, stay out of the sun. If you cannot avoid being in the sun, wear protective clothing and use sunscreen. Do not use sun lamps or tanning beds/booths. What side effects may I notice from receiving this medicine? Side effects that you should report to your doctor or health care professional as soon as possible: -allergic reactions like skin rash, itching or hives, swelling of the face, lips, or tongue -chest pain -fever or chills, usually related to the infusion -muscle or joint pain -red, scaly patches or raised bumps on the skin -signs of infection - fever or chills, cough, sore throat, pain or difficulty passing urine -swollen lymph nodes  in the neck, underarm, or groin areas -unexplained weight loss -unusual bleeding or bruising -unusually weak or tired -yellowing of the eyes or skin Side effects that usually do not require medical attention  (report to your doctor or health care professional if they continue or are bothersome): -headache -heartburn or stomach pain -nausea, vomiting This list may not describe all possible side effects. Call your doctor for medical advice about side effects. You may report side effects to FDA at 1-800-FDA-1088. Where should I keep my medicine? This drug is given in a hospital or clinic and will not be stored at home. NOTE: This sheet is a summary. It may not cover all possible information. If you have questions about this medicine, talk to your doctor, pharmacist, or health care provider.  2015, Elsevier/Gold Standard. (2008-06-27 10:26:02)

## 2015-03-05 ENCOUNTER — Ambulatory Visit (HOSPITAL_BASED_OUTPATIENT_CLINIC_OR_DEPARTMENT_OTHER): Payer: Medicare Other | Admitting: Oncology

## 2015-03-05 ENCOUNTER — Other Ambulatory Visit: Payer: Self-pay | Admitting: *Deleted

## 2015-03-05 ENCOUNTER — Other Ambulatory Visit (HOSPITAL_BASED_OUTPATIENT_CLINIC_OR_DEPARTMENT_OTHER): Payer: Medicare Other

## 2015-03-05 ENCOUNTER — Telehealth: Payer: Self-pay | Admitting: Oncology

## 2015-03-05 VITALS — BP 116/63 | HR 86 | Temp 98.3°F | Resp 19 | Ht 64.0 in | Wt 204.5 lb

## 2015-03-05 DIAGNOSIS — K5090 Crohn's disease, unspecified, without complications: Secondary | ICD-10-CM | POA: Diagnosis not present

## 2015-03-05 DIAGNOSIS — R079 Chest pain, unspecified: Secondary | ICD-10-CM | POA: Diagnosis not present

## 2015-03-05 DIAGNOSIS — D500 Iron deficiency anemia secondary to blood loss (chronic): Secondary | ICD-10-CM

## 2015-03-05 DIAGNOSIS — Z8572 Personal history of non-Hodgkin lymphomas: Secondary | ICD-10-CM | POA: Diagnosis not present

## 2015-03-05 DIAGNOSIS — C8590 Non-Hodgkin lymphoma, unspecified, unspecified site: Secondary | ICD-10-CM

## 2015-03-05 DIAGNOSIS — C8519 Unspecified B-cell lymphoma, extranodal and solid organ sites: Secondary | ICD-10-CM

## 2015-03-05 DIAGNOSIS — D638 Anemia in other chronic diseases classified elsewhere: Secondary | ICD-10-CM | POA: Diagnosis not present

## 2015-03-05 LAB — CBC WITH DIFFERENTIAL/PLATELET
BASO%: 0.4 % (ref 0.0–2.0)
Basophils Absolute: 0 10*3/uL (ref 0.0–0.1)
EOS ABS: 0.3 10*3/uL (ref 0.0–0.5)
EOS%: 4.3 % (ref 0.0–7.0)
HCT: 41.2 % (ref 34.8–46.6)
HGB: 13.7 g/dL (ref 11.6–15.9)
LYMPH%: 40.4 % (ref 14.0–49.7)
MCH: 31.8 pg (ref 25.1–34.0)
MCHC: 33.3 g/dL (ref 31.5–36.0)
MCV: 95.6 fL (ref 79.5–101.0)
MONO#: 0.6 10*3/uL (ref 0.1–0.9)
MONO%: 8.1 % (ref 0.0–14.0)
NEUT%: 46.8 % (ref 38.4–76.8)
NEUTROS ABS: 3.4 10*3/uL (ref 1.5–6.5)
PLATELETS: 302 10*3/uL (ref 145–400)
RBC: 4.31 10*6/uL (ref 3.70–5.45)
RDW: 14.2 % (ref 11.2–14.5)
WBC: 7.2 10*3/uL (ref 3.9–10.3)
lymph#: 2.9 10*3/uL (ref 0.9–3.3)

## 2015-03-05 MED ORDER — AMITRIPTYLINE HCL 75 MG PO TABS: 75.0000 mg | ORAL_TABLET | Freq: Every day | ORAL | Status: DC

## 2015-03-05 NOTE — Progress Notes (Signed)
  [facility] OFFICE PROGRESS NOTE   Diagnosis: Non-Hodgkin's lymphoma  INTERVAL HISTORY:   [name] returns as scheduled. She feels well. Good appetite. No dyspnea. She continues to have pain at the right chest. The pain is no longer relieved with Elavil. She continues Remicade for treatment of colitis. No bleeding.  Objective:  Vital signs in last 24 hours:  Blood pressure 116/63, pulse 86, temperature 98.3 F (36.8 C), temperature source Oral, resp. rate 19, height 5' 4"  (1.626 m), weight 204 lb 8 oz (92.761 kg), SpO2 97 %.    HEENT: Neck without mass Lymphatics: No cervical, supraclavicular, axillary, or inguinal nodes Resp: Scattered end inspiratory rales, no respiratory distress Cardio: Regular rate and rhythm GI: No hepato-splenomegaly Vascular: No leg edema Musculoskeletal: Keloid formation at the right thoracotomy incision, no chest wall tenderness       Lab Results:  Lab Results  Component Value Date   WBC 7.2 03/05/2015   HGB 13.7 03/05/2015   HCT 41.2 03/05/2015   MCV 95.6 03/05/2015   PLT 302 03/05/2015   NEUTROABS 3.4 03/05/2015   09/03/2014-serum M spike 0.43    Medications: I have reviewed the patient's current medications.  Assessment/Plan: 1. Non-Hodgkin lymphoma, low grade lymphoma involving the lungs, diagnosed in June 2006. No clinical evidence of progression. 2. Crohn's disease, followed by Dr. Wynetta Emery.She is taking Imuran and Remicade. 3. Post thoracotomy pain, persistent, initially improved with Elavil 4. Anemia secondary to chronic disease and gastrointestinal bleeding. Hemoglobin is improved. 5. History of thrombocytosis. 6. History of anorexia/weight loss in the setting of active Crohn disease. Improved. 7. History of a serum monoclonal protein, stable on repeat serum protein electrophoresis studies, last in October 2015 8. History of asthma. .   Disposition:  [name] remains in clinical remission from the  low-grade non-Hodgkin's lymphoma. She would like to continue every 6 month follow-up at the [facility]. We increased the Elavil dose to 75 mg daily.  [name], MD  03/05/2015  10:30 AM

## 2015-03-05 NOTE — Telephone Encounter (Signed)
Pt confirmed labs/ov per 04/12 POF, gave pt AVS and Calendar.... KJ

## 2015-05-08 ENCOUNTER — Encounter (HOSPITAL_COMMUNITY): Payer: Medicare Other

## 2015-05-29 ENCOUNTER — Other Ambulatory Visit (HOSPITAL_COMMUNITY): Payer: Self-pay | Admitting: Gastroenterology

## 2015-05-29 ENCOUNTER — Encounter (HOSPITAL_COMMUNITY): Payer: Self-pay

## 2015-05-29 ENCOUNTER — Encounter (HOSPITAL_COMMUNITY)
Admission: RE | Admit: 2015-05-29 | Discharge: 2015-05-29 | Disposition: A | Discharge status: Home / Self Care | Payer: Medicare Other | Source: Ambulatory Visit | Attending: Gastroenterology | Admitting: Gastroenterology

## 2015-05-29 ENCOUNTER — Inpatient Hospital Stay (HOSPITAL_COMMUNITY): Admission: RE | Admit: 2015-05-29 | Payer: Medicare Other | Source: Ambulatory Visit

## 2015-05-29 DIAGNOSIS — Z5181 Encounter for therapeutic drug level monitoring: Secondary | ICD-10-CM | POA: Insufficient documentation

## 2015-05-29 DIAGNOSIS — K5090 Crohn's disease, unspecified, without complications: Secondary | ICD-10-CM | POA: Diagnosis present

## 2015-05-29 MED ORDER — METHYLPREDNISOLONE SODIUM SUCC 125 MG IJ SOLR
20.0000 mg | INTRAMUSCULAR | Status: DC
  Filled 2015-05-29: qty 0.32

## 2015-05-29 MED ORDER — METHYLPREDNISOLONE SODIUM SUCC 40 MG IJ SOLR
20.0000 mg | INTRAMUSCULAR | Status: DC
  Administered 2015-05-29: 20 mg via INTRAVENOUS
  Filled 2015-05-29: qty 1

## 2015-05-29 MED ORDER — ACETAMINOPHEN 325 MG PO TABS
650.0000 mg | ORAL_TABLET | ORAL | Status: DC
  Administered 2015-05-29: 650 mg via ORAL
  Filled 2015-05-29: qty 2

## 2015-05-29 MED ORDER — SODIUM CHLORIDE 0.9 % IV SOLN
400.0000 mg | INTRAVENOUS | Status: DC
  Administered 2015-05-29: 400 mg via INTRAVENOUS
  Filled 2015-05-29: qty 40

## 2015-05-29 MED ORDER — SODIUM CHLORIDE 0.9 % IV SOLN
INTRAVENOUS | Status: DC
  Administered 2015-05-29: 250 mL via INTRAVENOUS

## 2015-05-29 NOTE — Discharge Instructions (Signed)
Infliximab injection What is this medicine? INFLIXIMAB (in [place] i mab) is used to treat Crohn's disease and ulcerative colitis. It is also used to treat ankylosing spondylitis, psoriasis, and some forms of arthritis. This medicine may be used for other purposes; ask your health care provider or pharmacist if you have questions. COMMON BRAND NAME(S): Remicade What should I tell my health care provider before I take this medicine? They need to know if you have any of these conditions: -diabetes -exposure to tuberculosis -heart failure -hepatitis or liver disease -immune system problems -infection -lung or breathing disease, like COPD -multiple sclerosis -current or past resident of [place] or [place] -seizure disorder -an unusual or allergic reaction to infliximab, mouse proteins, other medicines, foods, dyes, or preservatives -pregnant or trying to get pregnant -breast-feeding How should I use this medicine? This medicine is for injection into a vein. It is usually given by a health care professional in a hospital or clinic setting. A special MedGuide will be given to you by the pharmacist with each prescription and refill. Be sure to read this information carefully each time. Talk to your pediatrician regarding the use of this medicine in children. Special care may be needed. Overdosage: If you think you have taken too much of this medicine contact a poison control center or emergency room at once. NOTE: This medicine is only for you. Do not share this medicine with others. What if I miss a dose? It is important not to miss your dose. Call your doctor or health care professional if you are unable to keep an appointment. What may interact with this medicine? Do not take this medicine with any of the following medications: -anakinra -rilonacept This medicine may also interact with the following medications: -vaccines This list may not describe all possible interactions.  Give your health care provider a list of all the medicines, herbs, non-prescription drugs, or dietary supplements you use. Also tell them if you smoke, drink alcohol, or use illegal drugs. Some items may interact with your medicine. What should I watch for while using this medicine? Visit your doctor or health care professional for regular checks on your progress. If you get a cold or other infection while receiving this medicine, call your doctor or health care professional. Do not treat yourself. This medicine may decrease your body's ability to fight infections. Before beginning therapy, your doctor may do a test to see if you have been exposed to tuberculosis. This medicine may make the symptoms of heart failure worse in some patients. If you notice symptoms such as increased shortness of breath or swelling of the ankles or legs, contact your health care provider right away. If you are going to have surgery or dental work, tell your health care professional or dentist that you have received this medicine. If you take this medicine for plaque psoriasis, stay out of the sun. If you cannot avoid being in the sun, wear protective clothing and use sunscreen. Do not use sun lamps or tanning beds/booths. What side effects may I notice from receiving this medicine? Side effects that you should report to your doctor or health care professional as soon as possible: -allergic reactions like skin rash, itching or hives, swelling of the face, lips, or tongue -chest pain -fever or chills, usually related to the infusion -muscle or joint pain -red, scaly patches or raised bumps on the skin -signs of infection - fever or chills, cough, sore throat, pain or difficulty passing urine -swollen lymph nodes  in the neck, underarm, or groin areas -unexplained weight loss -unusual bleeding or bruising -unusually weak or tired -yellowing of the eyes or skin Side effects that usually do not require medical attention  (report to your doctor or health care professional if they continue or are bothersome): -headache -heartburn or stomach pain -nausea, vomiting This list may not describe all possible side effects. Call your doctor for medical advice about side effects. You may report side effects to FDA at 1-800-FDA-1088. Where should I keep my medicine? This drug is given in a hospital or clinic and will not be stored at home. NOTE: This sheet is a summary. It may not cover all possible information. If you have questions about this medicine, talk to your doctor, pharmacist, or health care provider.  2015, Elsevier/Gold Standard. (2008-06-27 10:26:02) Crohn Disease Crohn disease is a long-term (chronic) soreness and redness (inflammation) of the intestines (bowel). It can affect any portion of the digestive tract, from the mouth to the anus. It can also cause problems outside the digestive tract. Crohn disease is closely related to a disease called ulcerative colitis (together, these two diseases are called inflammatory bowel disease).  CAUSES  The cause of Crohn disease is not known. One Link Snuffer is that, in an easily affected person, the immune system is triggered to attack the body's own digestive tissue. Crohn disease runs in families. It seems to be more common in certain geographic areas and amongst certain races. There are no clear-cut dietary causes.  SYMPTOMS  Crohn disease can cause many different symptoms since it can affect many different parts of the body. Symptoms include:  Fatigue.  Weight loss.  Chronic diarrhea, sometime bloody.  Abdominal pain and cramps.  Fever.  Ulcers or canker sores in the mouth or rectum.  Anemia (low red blood cells).  Arthritis, skin problems, and eye problems may occur. Complications of Crohn disease can include:  Series of holes (perforation) of the bowel.  Portions of the intestines sticking to each other (adhesions).  Obstruction of the  bowel.  Fistula formation, typically in the rectal area but also in other areas. A fistula is an opening between the bowels and the outside, or between the bowels and another organ.  A painful crack in the mucous membrane of the anus (rectal fissure). DIAGNOSIS  Your caregiver may suspect Crohn disease based on your symptoms and an exam. Blood tests may confirm that there is a problem. You may be asked to submit a stool specimen for examination. X-rays and CT scans may be necessary. Ultimately, the diagnosis is usually made after a procedure that uses a flexible tube that is inserted via your mouth or your anus. This is done under sedation and is called either an upper endoscopy or colonoscopy. With these tests, the specialist can take tiny tissue samples and remove them from the inside of the bowel (biopsy). Examination of this biopsy tissue under a microscope can reveal Crohn disease as the cause of your symptoms. Due to the many different forms that Crohn disease can take, symptoms may be present for several years before a diagnosis is made. TREATMENT  Medications are often used to decrease inflammation and control the immune system. These include medicines related to aspirin, steroid medications, and newer and stronger medications to slow down the immune system. Some medications may be used as suppositories or enemas. A number of other medications are used or have been studied. Your caregiver will make specific recommendations. HOME CARE INSTRUCTIONS   Symptoms such  as diarrhea can be controlled with medications. Avoid foods that have a laxative effect such as fresh fruit, vegetables, and dairy products. During flare-ups, you can rest your bowel by refraining from solid foods. Drink clear liquids frequently during the day. (Electrolyte or rehydrating fluids are best. Your caregiver can help you with suggestions.) Drink often to prevent loss of body fluids (dehydration). When diarrhea has cleared, eat  small meals and more frequently. Avoid food additives and stimulants such as caffeine (coffee, tea, or chocolate). Enzyme supplements may help if you develop intolerance to a sugar in dairy products (lactose). Ask your caregiver or dietitian about specific dietary instructions.  Try to maintain a positive attitude. Learn relaxation techniques such as self-hypnosis, mental imaging, and muscle relaxation.  If possible, avoid stresses which can aggravate your condition.  Exercise regularly.  Follow your diet.  Always get plenty of rest. SEEK MEDICAL CARE IF:   Your symptoms fail to improve after a week or two of new treatment.  You experience continued weight loss.  You have ongoing cramps or loose bowels.  You develop a new skin rash, skin sores, or eye problems. SEEK IMMEDIATE MEDICAL CARE IF:   You have worsening of your symptoms or develop new symptoms.  You have a fever.  You develop bloody diarrhea.  You develop severe abdominal pain. MAKE SURE YOU:   Understand these instructions.  Will watch your condition.  Will get help right away if you are not doing well or get worse. Document Released: 08/19/2005 Document Revised: 03/26/2014 Document Reviewed: 07/18/2007 [facility] Patient Information 2015 [place], [place]. This information is not intended to replace advice given to you by your health care provider. Make sure you discuss any questions you have with your health care provider.

## 2015-06-03 ENCOUNTER — Other Ambulatory Visit: Payer: Self-pay | Admitting: Critical Care Medicine

## 2015-06-24 ENCOUNTER — Other Ambulatory Visit: Payer: Self-pay | Admitting: Critical Care Medicine

## 2015-07-03 ENCOUNTER — Encounter (HOSPITAL_COMMUNITY): Payer: Medicare Other

## 2015-07-24 ENCOUNTER — Encounter (HOSPITAL_COMMUNITY): Payer: Self-pay

## 2015-07-24 ENCOUNTER — Encounter (HOSPITAL_COMMUNITY)
Admission: RE | Admit: 2015-07-24 | Discharge: 2015-07-24 | Disposition: A | Discharge status: Home / Self Care | Payer: Medicare Other | Source: Ambulatory Visit | Attending: Gastroenterology | Admitting: Gastroenterology

## 2015-07-24 DIAGNOSIS — K5090 Crohn's disease, unspecified, without complications: Secondary | ICD-10-CM | POA: Insufficient documentation

## 2015-07-24 DIAGNOSIS — Z5181 Encounter for therapeutic drug level monitoring: Secondary | ICD-10-CM | POA: Insufficient documentation

## 2015-07-24 MED ORDER — ACETAMINOPHEN 325 MG PO TABS
650.0000 mg | ORAL_TABLET | ORAL | Status: AC
  Administered 2015-07-24: 650 mg via ORAL
  Filled 2015-07-24: qty 2

## 2015-07-24 MED ORDER — SODIUM CHLORIDE 0.9 % IV SOLN
INTRAVENOUS | Status: AC
  Administered 2015-07-24: 08:00:00 via INTRAVENOUS

## 2015-07-24 MED ORDER — METHYLPREDNISOLONE SODIUM SUCC 40 MG IJ SOLR
20.0000 mg | INTRAMUSCULAR | Status: AC
  Administered 2015-07-24: 20 mg via INTRAVENOUS
  Filled 2015-07-24: qty 1

## 2015-07-24 MED ORDER — SODIUM CHLORIDE 0.9 % IV SOLN
400.0000 mg | INTRAVENOUS | Status: AC
  Administered 2015-07-24: 400 mg via INTRAVENOUS
  Filled 2015-07-24: qty 40

## 2015-08-14 ENCOUNTER — Ambulatory Visit (INDEPENDENT_AMBULATORY_CARE_PROVIDER_SITE_OTHER): Payer: Medicare Other | Admitting: Pulmonary Disease

## 2015-08-14 ENCOUNTER — Encounter: Payer: Self-pay | Admitting: Pulmonary Disease

## 2015-08-14 VITALS — BP 128/68 | HR 78 | Ht 64.0 in | Wt 212.0 lb

## 2015-08-14 DIAGNOSIS — J4540 Moderate persistent asthma, uncomplicated: Secondary | ICD-10-CM

## 2015-08-14 NOTE — Progress Notes (Addendum)
Subjective:    Patient ID: [name], female    DOB: 10-Feb-1951, 64 y.o.   MRN: [id number]  HPI  Follow-up for moderate persistent asthma.  [name] is a 64 year old with moderate persistent asthma, sleep apnea. She is a former patient of Dr. Joya Gaskins. She is feeling well with no exacerbations, ER visits or hospitalizations. Denies any dyspnea, wheezing.   Past Medical History  Diagnosis Date  . Allergic rhinitis   . Asthma   . Hypertension   . Small cell b-cell lymphoma     R supra hilar mass- Needle Bx positive 2006  . Crohn's disease   . HTN (hypertension)   . Chronic anemia   . Type 2 diabetes mellitus   . Sleep apnea     pt denies this  . Bilateral sciatica     nothing recently 4/`/15    Current outpatient prescriptions:  .  acetaminophen (TYLENOL ARTHRITIS PAIN) 650 MG CR tablet, Take 1,300 mg by mouth every 8 (eight) hours as needed for pain., Disp: , Rfl:  .  ADVAIR DISKUS 100-50 MCG/DOSE AEPB, inhale 1 dose by mouth twice a day, Disp: 60 each, Rfl: 11 .  amitriptyline (ELAVIL) 75 MG tablet, Take 1 tablet (75 mg total) by mouth at bedtime., Disp: 30 tablet, Rfl: 5 .  aspirin EC 81 MG tablet, Take 81 mg by mouth daily., Disp: , Rfl:  .  Calcium Carbonate-Vitamin D (CALCIUM 600 + D PO), Take 1 tablet by mouth daily. , Disp: , Rfl:  .  Canagliflozin (INVOKANA) 300 MG TABS, Take 300 mg by mouth daily., Disp: , Rfl:  .  estradiol (ESTRACE) 1 MG tablet, Take 1 mg by mouth daily., Disp: , Rfl:  .  fluconazole (DIFLUCAN) 100 MG tablet, Take 100 mg by mouth every 7 (seven) days., Disp: , Rfl:  .  fluticasone (FLONASE) 50 MCG/ACT nasal spray, Place 2 sprays into both nostrils daily., Disp: 16 g, Rfl: 0 .  InFLIXimab (REMICADE IV), Inject into the vein every 8 (eight) weeks. For Crohn's, Disp: , Rfl:  .  montelukast (SINGULAIR) 10 MG tablet, Take 1 tablet by mouth at  bedtime, Disp: 90 tablet, Rfl: 0 .  Multiple Vitamin (MULTIVITAMIN WITH MINERALS) TABS, Take 1 tablet  by mouth daily., Disp: , Rfl:  .  olmesartan-hydrochlorothiazide (BENICAR HCT) 20-12.5 MG per tablet, Take 1 tablet by mouth daily.  , Disp: , Rfl:  .  omeprazole (PRILOSEC) 20 MG capsule, Take 1 capsule by mouth    daily ., Disp: 90 capsule, Rfl: 1 .  OVER THE COUNTER MEDICATION, Take 2 tablets by mouth daily. Sinus Medication., Disp: , Rfl:  .  rosuvastatin (CRESTOR) 20 MG tablet, Take 20 mg by mouth daily., Disp: , Rfl:  .  sertraline (ZOLOFT) 100 MG tablet, Take 100 mg by mouth daily.  , Disp: , Rfl:  .  albuterol (PROAIR HFA) 108 (90 BASE) MCG/ACT inhaler, Inhale 2 puffs into the lungs every 6 (six) hours as needed for wheezing or shortness of breath. (Patient not taking: Reported on 08/14/2015), Disp: 1 Inhaler, Rfl: 3  Review of Systems  Constitutional: Negative for fever and unexpected weight change.  HENT: Positive for ear pain. Negative for congestion, dental problem, nosebleeds, postnasal drip, rhinorrhea, sinus pressure, sneezing, sore throat and trouble swallowing.   Eyes: Negative for redness and itching.  Respiratory: Positive for cough. Negative for chest tightness, shortness of breath and wheezing.   Cardiovascular: Negative for palpitations and leg swelling.  Gastrointestinal: Negative  for nausea and vomiting.  Genitourinary: Negative for dysuria.  Musculoskeletal: Negative for joint swelling.  Skin: Negative for rash.  Neurological: Negative for headaches.  Hematological: Does not bruise/bleed easily.  Psychiatric/Behavioral: Negative for dysphoric mood. The patient is not nervous/anxious.    Blood pressure 128/68, pulse 78, height 5' 4"  (1.626 m), weight 212 lb (96.163 kg), SpO2 95 %.     Objective:   Physical Exam  Constitutional: She is oriented to person, place, and time. She appears well-developed and well-nourished.  HENT:  Mouth/Throat: Oropharynx is clear and moist.  Eyes: Pupils are equal, round, and reactive to light.  Neck: Normal range of motion. Neck  supple.  Cardiovascular: Normal rate, regular rhythm and normal heart sounds.   Pulmonary/Chest: Effort normal and breath sounds normal.  Abdominal: Soft. Bowel sounds are normal.  Musculoskeletal: Normal range of motion.  Neurological: She is alert and oriented to person, place, and time.  Skin: Skin is warm and dry.       Assessment & Plan:   #1. Moderate persistent asthma. Stable clinically Recommended to continue inhalers, Singulair, Flonase as prescribed. PFTs  #2. Sleep apnea Not using CPAP for several years as she feels asymptomatic. We will readdress this at next visit.  Marshell Garfinkel MD [facility] Pager [phone] If no answer or after 3pm call: [phone] 08/14/2015, 3:52 PM

## 2015-08-14 NOTE — Addendum Note (Signed)
Addended by: Jerrol Banana on: 08/14/2015 04:49 PM   Modules accepted: Orders

## 2015-08-14 NOTE — Addendum Note (Signed)
Addended by: Jerrol Banana on: 08/14/2015 03:58 PM   Modules accepted: Orders

## 2015-08-14 NOTE — Patient Instructions (Addendum)
Continue inhalers and singulair. Get pulmonary function test  Return in 6 months.

## 2015-08-28 ENCOUNTER — Encounter (HOSPITAL_COMMUNITY): Payer: Medicare Other

## 2015-09-03 ENCOUNTER — Other Ambulatory Visit (HOSPITAL_BASED_OUTPATIENT_CLINIC_OR_DEPARTMENT_OTHER): Payer: Medicare Other

## 2015-09-03 ENCOUNTER — Telehealth: Payer: Self-pay | Admitting: Oncology

## 2015-09-03 ENCOUNTER — Ambulatory Visit (HOSPITAL_BASED_OUTPATIENT_CLINIC_OR_DEPARTMENT_OTHER): Payer: Medicare Other | Admitting: Oncology

## 2015-09-03 VITALS — BP 142/76 | HR 86 | Temp 98.4°F | Resp 18 | Ht 64.0 in | Wt 211.0 lb

## 2015-09-03 DIAGNOSIS — C8592 Non-Hodgkin lymphoma, unspecified, intrathoracic lymph nodes: Secondary | ICD-10-CM

## 2015-09-03 DIAGNOSIS — Z23 Encounter for immunization: Secondary | ICD-10-CM | POA: Diagnosis not present

## 2015-09-03 DIAGNOSIS — C8590 Non-Hodgkin lymphoma, unspecified, unspecified site: Secondary | ICD-10-CM

## 2015-09-03 LAB — BASIC METABOLIC PANEL (CC13)
Anion Gap: 10 mEq/L (ref 3–11)
BUN: 14 mg/dL (ref 7.0–26.0)
CHLORIDE: 100 meq/L (ref 98–109)
CO2: 27 mEq/L (ref 22–29)
Calcium: 9.6 mg/dL (ref 8.4–10.4)
Creatinine: 0.9 mg/dL (ref 0.6–1.1)
EGFR: 80 mL/min/{1.73_m2} — ABNORMAL LOW (ref >90)
Glucose: 172 mg/dl — ABNORMAL HIGH (ref 70–140)
POTASSIUM: 3.7 meq/L (ref 3.5–5.1)
Sodium: 137 mEq/L (ref 136–145)

## 2015-09-03 LAB — CBC WITH DIFFERENTIAL/PLATELET
BASO%: 0.7 % (ref 0.0–2.0)
Basophils Absolute: 0.1 10*3/uL (ref 0.0–0.1)
EOS%: 4.7 % (ref 0.0–7.0)
Eosinophils Absolute: 0.3 10*3/uL (ref 0.0–0.5)
HCT: 41.6 % (ref 34.8–46.6)
HGB: 13.9 g/dL (ref 11.6–15.9)
LYMPH#: 1.9 10*3/uL (ref 0.9–3.3)
LYMPH%: 27.6 % (ref 14.0–49.7)
MCH: 32.3 pg (ref 25.1–34.0)
MCHC: 33.4 g/dL (ref 31.5–36.0)
MCV: 96.6 fL (ref 79.5–101.0)
MONO#: 0.5 10*3/uL (ref 0.1–0.9)
MONO%: 6.9 % (ref 0.0–14.0)
NEUT%: 60.1 % (ref 38.4–76.8)
NEUTROS ABS: 4.2 10*3/uL (ref 1.5–6.5)
PLATELETS: 293 10*3/uL (ref 145–400)
RBC: 4.3 10*6/uL (ref 3.70–5.45)
RDW: 13.8 % (ref 11.2–14.5)
WBC: 7.1 10*3/uL (ref 3.9–10.3)

## 2015-09-03 MED ORDER — INFLUENZA VAC SPLIT QUAD 0.5 ML IM SUSY
0.5000 mL | PREFILLED_SYRINGE | Freq: Once | INTRAMUSCULAR | Status: AC
  Administered 2015-09-03: 0.5 mL via INTRAMUSCULAR
  Filled 2015-09-03: qty 0.5

## 2015-09-03 NOTE — Telephone Encounter (Signed)
Gave adn printed aptp sched and avs for pt for April

## 2015-09-03 NOTE — Progress Notes (Signed)
  [facility] OFFICE PROGRESS NOTE   Diagnosis: Non-Hodgkin's lymphoma, serum monoclonal protein  INTERVAL HISTORY:   Ms. Truluck returns as scheduled. She feels well. She continues Remicade and reports no rectal bleeding at present. She continues to have pain at the right chest wall.  Objective:  Vital signs in last 24 hours:  Blood pressure 142/76, pulse 86, temperature 98.4 F (36.9 C), temperature source Oral, resp. rate 18, height 5' 4"  (1.626 m), weight 211 lb (95.709 kg), SpO2 94 %.    HEENT: Neck without mass Lymphatics: No cervical, supraclavicular, axillary, or inguinal nodes Resp: Lungs with coarse rhonchi and wheezes at the left upper chest, no respiratory distress Cardio: Regular rate and rhythm GI: No hepatosplenomegaly, nontender, no mass Vascular: No leg edema  Skin: Keloid at the right thoracotomy incision     Lab Results:  Lab Results  Component Value Date   WBC 7.1 09/03/2015   HGB 13.9 09/03/2015   HCT 41.6 09/03/2015   MCV 96.6 09/03/2015   PLT 293 09/03/2015   NEUTROABS 4.2 09/03/2015    Medications: I have reviewed the patient's current medications.  Assessment/Plan: 1. Non-Hodgkin lymphoma, low grade lymphoma involving the lungs, diagnosed in June 2006. No clinical evidence of progression. 2. Crohn's disease, followed by Dr. Wynetta Emery.She continues Remicade 3. Post thoracotomy pain, persistent, initially improved with Elavil 4. Anemia secondary to chronic disease and gastrointestinal bleeding. Hemoglobin is improved. 5. History of thrombocytosis. 6. History of anorexia/weight loss in the setting of active Crohn disease. Improved. 7. History of a serum monoclonal protein, stable on repeat serum protein electrophoresis studies, last in October 2015 8. History of asthma. .    Disposition:  Ms. Mckinstry appears stable. We will follow-up on the serum protein electrophoresis from today. She will return for an office and lab visit in  6 months.  She received an influenza vaccine today.  [name], MD  09/03/2015  11:31 AM

## 2015-09-05 LAB — PROTEIN ELECTROPHORESIS, SERUM
ABNORMAL PROTEIN BAND1: 0.4 g/dL
ALBUMIN ELP: 3.8 g/dL (ref 3.8–4.8)
ALPHA-2-GLOBULIN: 0.7 g/dL (ref 0.5–0.9)
Alpha-1-Globulin: 0.3 g/dL (ref 0.2–0.3)
BETA GLOBULIN: 0.5 g/dL (ref 0.4–0.6)
Beta 2: 0.5 g/dL (ref 0.2–0.5)
GAMMA GLOBULIN: 1.4 g/dL (ref 0.8–1.7)
TOTAL PROTEIN, SERUM ELECTROPHOR: 7.1 g/dL (ref 6.1–8.1)

## 2015-09-06 ENCOUNTER — Telehealth: Payer: Self-pay | Admitting: *Deleted

## 2015-09-06 NOTE — Telephone Encounter (Signed)
-----   Message from Ladell Pier, MD sent at 09/05/2015  6:26 PM EDT ----- Please call patient, low level m-spike is stable, f/u as scheduled

## 2015-09-06 NOTE — Telephone Encounter (Signed)
Pt made aware of lab results and appreciated call. Knows to call us with any questions or concerns; no other needs identified at this time.

## 2015-09-11 ENCOUNTER — Other Ambulatory Visit: Payer: Self-pay | Admitting: Critical Care Medicine

## 2015-09-17 ENCOUNTER — Other Ambulatory Visit: Payer: Self-pay | Admitting: *Deleted

## 2015-09-17 MED ORDER — MONTELUKAST SODIUM 10 MG PO TABS: ORAL_TABLET | ORAL | Status: DC

## 2015-09-17 MED ORDER — OMEPRAZOLE 20 MG PO CPDR: DELAYED_RELEASE_CAPSULE | ORAL | Status: DC

## 2015-09-18 ENCOUNTER — Encounter (HOSPITAL_COMMUNITY): Payer: Self-pay

## 2015-09-18 ENCOUNTER — Encounter (HOSPITAL_COMMUNITY)
Admission: RE | Admit: 2015-09-18 | Discharge: 2015-09-18 | Disposition: A | Discharge status: Home / Self Care | Payer: Medicare Other | Source: Ambulatory Visit | Attending: Gastroenterology | Admitting: Gastroenterology

## 2015-09-18 DIAGNOSIS — K5090 Crohn's disease, unspecified, without complications: Secondary | ICD-10-CM | POA: Insufficient documentation

## 2015-09-18 DIAGNOSIS — Z5181 Encounter for therapeutic drug level monitoring: Secondary | ICD-10-CM | POA: Diagnosis not present

## 2015-09-18 MED ORDER — SODIUM CHLORIDE 0.9 % IV SOLN
Freq: Once | INTRAVENOUS | Status: AC
  Administered 2015-09-18: 08:00:00 via INTRAVENOUS

## 2015-09-18 MED ORDER — SODIUM CHLORIDE 0.9 % IV SOLN
400.0000 mg | Freq: Once | INTRAVENOUS | Status: AC
  Administered 2015-09-18: 400 mg via INTRAVENOUS
  Filled 2015-09-18: qty 40

## 2015-09-18 MED ORDER — METHYLPREDNISOLONE SODIUM SUCC 125 MG IJ SOLR
20.0000 mg | Freq: Once | INTRAMUSCULAR | Status: DC
  Filled 2015-09-18: qty 0.32

## 2015-09-18 MED ORDER — METHYLPREDNISOLONE SODIUM SUCC 40 MG IJ SOLR
20.0000 mg | Freq: Once | INTRAMUSCULAR | Status: AC
  Administered 2015-09-18: 20 mg via INTRAVENOUS
  Filled 2015-09-18: qty 1

## 2015-09-18 MED ORDER — ACETAMINOPHEN 325 MG PO TABS
650.0000 mg | ORAL_TABLET | Freq: Once | ORAL | Status: AC
  Administered 2015-09-18: 650 mg via ORAL
  Filled 2015-09-18: qty 2

## 2015-09-18 NOTE — Discharge Instructions (Signed)
Infliximab injection What is this medicine? INFLIXIMAB (in [place] i mab) is used to treat Crohn's disease and ulcerative colitis. It is also used to treat ankylosing spondylitis, psoriasis, and some forms of arthritis. This medicine may be used for other purposes; ask your health care provider or pharmacist if you have questions. What should I tell my health care provider before I take this medicine? They need to know if you have any of these conditions: -diabetes -exposure to tuberculosis -heart failure -hepatitis or liver disease -immune system problems -infection -lung or breathing disease, like COPD -multiple sclerosis -current or past resident of [place] or [place] -seizure disorder -an unusual or allergic reaction to infliximab, mouse proteins, other medicines, foods, dyes, or preservatives -pregnant or trying to get pregnant -breast-feeding How should I use this medicine? This medicine is for injection into a vein. It is usually given by a health care professional in a hospital or clinic setting. A special MedGuide will be given to you by the pharmacist with each prescription and refill. Be sure to read this information carefully each time. Talk to your pediatrician regarding the use of this medicine in children. Special care may be needed. Overdosage: If you think you have taken too much of this medicine contact a poison control center or emergency room at once. NOTE: This medicine is only for you. Do not share this medicine with others. What if I miss a dose? It is important not to miss your dose. Call your doctor or health care professional if you are unable to keep an appointment. What may interact with this medicine? Do not take this medicine with any of the following medications: -anakinra -rilonacept This medicine may also interact with the following medications: -vaccines This list may not describe all possible interactions. Give your health care provider  a list of all the medicines, herbs, non-prescription drugs, or dietary supplements you use. Also tell them if you smoke, drink alcohol, or use illegal drugs. Some items may interact with your medicine. What should I watch for while using this medicine? Visit your doctor or health care professional for regular checks on your progress. If you get a cold or other infection while receiving this medicine, call your doctor or health care professional. Do not treat yourself. This medicine may decrease your body's ability to fight infections. Before beginning therapy, your doctor may do a test to see if you have been exposed to tuberculosis. This medicine may make the symptoms of heart failure worse in some patients. If you notice symptoms such as increased shortness of breath or swelling of the ankles or legs, contact your health care provider right away. If you are going to have surgery or dental work, tell your health care professional or dentist that you have received this medicine. If you take this medicine for plaque psoriasis, stay out of the sun. If you cannot avoid being in the sun, wear protective clothing and use sunscreen. Do not use sun lamps or tanning beds/booths. What side effects may I notice from receiving this medicine? Side effects that you should report to your doctor or health care professional as soon as possible: -allergic reactions like skin rash, itching or hives, swelling of the face, lips, or tongue -chest pain -fever or chills, usually related to the infusion -muscle or joint pain -red, scaly patches or raised bumps on the skin -signs of infection - fever or chills, cough, sore throat, pain or difficulty passing urine -swollen lymph nodes in the neck, underarm,  or groin areas -unexplained weight loss -unusual bleeding or bruising -unusually weak or tired -yellowing of the eyes or skin Side effects that usually do not require medical attention (report to your doctor or health  care professional if they continue or are bothersome): -headache -heartburn or stomach pain -nausea, vomiting This list may not describe all possible side effects. Call your doctor for medical advice about side effects. You may report side effects to FDA at 1-800-FDA-1088. Where should I keep my medicine? This drug is given in a hospital or clinic and will not be stored at home. NOTE: This sheet is a summary. It may not cover all possible information. If you have questions about this medicine, talk to your doctor, pharmacist, or health care provider.    2016, Elsevier/Gold Standard. (2008-06-27 10:26:02)

## 2015-09-26 ENCOUNTER — Other Ambulatory Visit: Payer: Self-pay

## 2015-09-26 DIAGNOSIS — Z1231 Encounter for screening mammogram for malignant neoplasm of breast: Secondary | ICD-10-CM

## 2015-10-23 ENCOUNTER — Encounter (HOSPITAL_COMMUNITY): Payer: Medicare Other

## 2015-10-31 ENCOUNTER — Ambulatory Visit
Admission: RE | Admit: 2015-10-31 | Discharge: 2015-10-31 | Disposition: A | Discharge status: Home / Self Care | Payer: Medicare Other | Source: Ambulatory Visit

## 2015-10-31 DIAGNOSIS — Z1231 Encounter for screening mammogram for malignant neoplasm of breast: Secondary | ICD-10-CM

## 2015-11-05 ENCOUNTER — Telehealth: Payer: Self-pay

## 2015-11-05 NOTE — Telephone Encounter (Signed)
Pt was calling to ask if Dr Benay Spice will consider switching her elavil to lyrica. The elavil is not working. She uses [facility] on [address].

## 2015-11-05 NOTE — Telephone Encounter (Signed)
Should get via Dr. [name]

## 2015-11-06 NOTE — Telephone Encounter (Signed)
Returned call to pt, instructed her to contact Dr. [name] to discuss Lyrica. She voiced understanding.

## 2015-11-14 ENCOUNTER — Encounter (HOSPITAL_COMMUNITY)
Admission: RE | Admit: 2015-11-14 | Discharge: 2015-11-14 | Disposition: A | Discharge status: Home / Self Care | Payer: Medicare Other | Source: Ambulatory Visit | Attending: Gastroenterology | Admitting: Gastroenterology

## 2015-11-14 DIAGNOSIS — Z5181 Encounter for therapeutic drug level monitoring: Secondary | ICD-10-CM | POA: Insufficient documentation

## 2015-11-14 DIAGNOSIS — K5090 Crohn's disease, unspecified, without complications: Secondary | ICD-10-CM | POA: Insufficient documentation

## 2015-11-14 MED ORDER — SODIUM CHLORIDE 0.9 % IV SOLN
400.0000 mg | INTRAVENOUS | Status: DC
  Administered 2015-11-14: 400 mg via INTRAVENOUS
  Filled 2015-11-14: qty 40

## 2015-11-14 MED ORDER — SODIUM CHLORIDE 0.9 % IV SOLN
INTRAVENOUS | Status: DC
  Administered 2015-11-14: 09:00:00 via INTRAVENOUS

## 2015-11-14 MED ORDER — ACETAMINOPHEN 500 MG PO TABS
1000.0000 mg | ORAL_TABLET | ORAL | Status: DC
  Administered 2015-11-14: 1000 mg via ORAL
  Filled 2015-11-14: qty 2

## 2015-11-14 MED ORDER — METHYLPREDNISOLONE SODIUM SUCC 125 MG IJ SOLR
20.0000 mg | INTRAMUSCULAR | Status: DC
  Administered 2015-11-14: 20 mg via INTRAVENOUS
  Filled 2015-11-14: qty 0.32

## 2015-11-14 NOTE — Discharge Instructions (Signed)
REMICADE Infliximab injection What is this medicine? INFLIXIMAB (in [place] i mab) is used to treat Crohn's disease and ulcerative colitis. It is also used to treat ankylosing spondylitis, psoriasis, and some forms of arthritis. This medicine may be used for other purposes; ask your health care provider or pharmacist if you have questions. What should I tell my health care provider before I take this medicine? They need to know if you have any of these conditions: -diabetes -exposure to tuberculosis -heart failure -hepatitis or liver disease -immune system problems -infection -lung or breathing disease, like COPD -multiple sclerosis -current or past resident of [place] or [place] -seizure disorder -an unusual or allergic reaction to infliximab, mouse proteins, other medicines, foods, dyes, or preservatives -pregnant or trying to get pregnant -breast-feeding How should I use this medicine? This medicine is for injection into a vein. It is usually given by a health care professional in a hospital or clinic setting. A special MedGuide will be given to you by the pharmacist with each prescription and refill. Be sure to read this information carefully each time. Talk to your pediatrician regarding the use of this medicine in children. Special care may be needed. Overdosage: If you think you have taken too much of this medicine contact a poison control center or emergency room at once. NOTE: This medicine is only for you. Do not share this medicine with others. What if I miss a dose? It is important not to miss your dose. Call your doctor or health care professional if you are unable to keep an appointment. What may interact with this medicine? Do not take this medicine with any of the following medications: -anakinra -rilonacept This medicine may also interact with the following medications: -vaccines This list may not describe all possible interactions. Give your health care  provider a list of all the medicines, herbs, non-prescription drugs, or dietary supplements you use. Also tell them if you smoke, drink alcohol, or use illegal drugs. Some items may interact with your medicine. What should I watch for while using this medicine? Visit your doctor or health care professional for regular checks on your progress. If you get a cold or other infection while receiving this medicine, call your doctor or health care professional. Do not treat yourself. This medicine may decrease your body's ability to fight infections. Before beginning therapy, your doctor may do a test to see if you have been exposed to tuberculosis. This medicine may make the symptoms of heart failure worse in some patients. If you notice symptoms such as increased shortness of breath or swelling of the ankles or legs, contact your health care provider right away. If you are going to have surgery or dental work, tell your health care professional or dentist that you have received this medicine. If you take this medicine for plaque psoriasis, stay out of the sun. If you cannot avoid being in the sun, wear protective clothing and use sunscreen. Do not use sun lamps or tanning beds/booths. What side effects may I notice from receiving this medicine? Side effects that you should report to your doctor or health care professional as soon as possible: -allergic reactions like skin rash, itching or hives, swelling of the face, lips, or tongue -chest pain -fever or chills, usually related to the infusion -muscle or joint pain -red, scaly patches or raised bumps on the skin -signs of infection - fever or chills, cough, sore throat, pain or difficulty passing urine -swollen lymph nodes in the neck,  underarm, or groin areas -unexplained weight loss -unusual bleeding or bruising -unusually weak or tired -yellowing of the eyes or skin Side effects that usually do not require medical attention (report to your doctor  or health care professional if they continue or are bothersome): -headache -heartburn or stomach pain -nausea, vomiting This list may not describe all possible side effects. Call your doctor for medical advice about side effects. You may report side effects to FDA at 1-800-FDA-1088. Where should I keep my medicine? This drug is given in a hospital or clinic and will not be stored at home. NOTE: This sheet is a summary. It may not cover all possible information. If you have questions about this medicine, talk to your doctor, pharmacist, or health care provider.    2016, Elsevier/Gold Standard. (2008-06-27 10:26:02) Crohn Disease Crohn disease is a long-lasting (chronic) disease that affects your gastrointestinal (GI) tract. It often causes irritation and swelling (inflammation) in your small intestine and the beginning of your large intestine. However, it can affect any part of your GI tract. Crohn disease is part of a group of illnesses that are known as inflammatory bowel disease (IBD). Crohn disease may start slowly and get worse over time. Symptoms may come and go. They may also disappear for months or even years at a time (remission). CAUSES The exact cause of Crohn disease is not known. It may be a response that causes your body's defense system (immune system) to mistakenly attack healthy cells and tissues (autoimmune response). Your genes and your environment may also play a role. RISK FACTORS You may be at greater risk for Crohn disease if you:  Have other family members with Crohn disease or another IBD.  Use any tobacco products, including cigarettes, chewing tobacco, or electronic cigarettes.  Are in your 69s.  Have [place] European ancestry. SIGNS AND SYMPTOMS The main signs and symptoms of Crohn disease involve your GI tract. These include:  Diarrhea.  Rectal bleeding.  An urgent need to move your bowels.  The feeling that you are not finished having a bowel  movement.  Abdominal pain or cramping.  Constipation. General signs and symptoms of Crohn disease may also include:  Unexplained weight loss.  Fatigue.  Fever.  Nausea.  Loss of appetite.  Joint pain  Changes in vision.  Red bumps on your skin. DIAGNOSIS Your health care provider may suspect Crohn disease based on your symptoms and your medical history. Your health care provider will do a physical exam. You may need to see a health care provider who specializes in diseases of the digestive tract (gastroenterologist). You may also have tests to help your health care providers make a diagnosis. These may include:  Blood tests.  Stool sample tests.  Imaging tests, such as X-rays and CT scans.  Tests to examine the inside of your intestines using a long, flexible tube that has a light and a camera on the end (endoscopy or colonoscopy).  A procedure to take tissue samples from inside your bowel (biopsy) to be examined under a microscope. TREATMENT  There is no cure for Crohn disease. Treatment will focus on managing your symptoms. Crohn disease affects each person differently. Your treatment may include:  Resting your bowels. Drinking only clear liquids or getting nutrition through an IV for a period of time gives your bowels a chance to heal because they are not passing stools.  Medicines. These may be used alone or in combination (combination therapy). These may include antibiotic medicines. You may  be given medicines that help to:  Reduce inflammation.  Control your immune system activity.  Fight infections.  Relieve cramps and prevent diarrhea.  Control your pain.  Surgery. You may need surgery if:  Medicines and other treatments are no longer working.  You develop complications from severe Crohn disease.  A section of your intestine becomes so damaged that it needs to be removed. HOME CARE INSTRUCTIONS  Take medicines only as directed by your health care  provider.  If you were prescribed an antibiotic medicine, finish it all even if you start to feel better.  Keep all follow-up visits as directed by your health care provider. This is important.  Talk with your health care provider about changing your diet. This may help your symptoms. Your health care provide may recommend changes, such as:  Drinking more fluids.  Avoiding milk and other foods that contain lactose.  Eating a low-fat diet.  Avoiding high-fiber foods, such as popcorn and nuts.  Avoiding carbonated beverages, such as soda.  Eating smaller meals more often rather than eating large meals.  Keeping a food diary to identify foods that make your symptoms better or worse.  Do not use any tobacco products, including cigarettes, chewing tobacco, or electronic cigarettes. If you need help quitting, ask your health care provider.  Limit alcohol intake to no more than 1 drink per day for nonpregnant women and 2 drinks per day for men. One drink equals 12 ounces of beer, 5 ounces of wine, or 1 ounces of hard liquor.  Exercise daily or as directed by your health care provider. SEEK MEDICAL CARE IF:  You have diarrhea, abdominal cramps, and other gastrointestinal problems that are present almost all of the time.  Your symptoms do not improve with treatment.  You continue to lose weight.  You develop a rash or sores on your skin.  You develop eye problems.  You have a fever.   Your symptoms get worse.  You develop new symptoms. SEEK IMMEDIATE MEDICAL CARE IF:  You have bloody diarrhea.  You develop severe abdominal pain.  You cannot pass stools.   This information is not intended to replace advice given to you by your health care provider. Make sure you discuss any questions you have with your health care provider.   Document Released: 08/19/2005 Document Revised: 11/30/2014 Document Reviewed: 06/27/2014 Elsevier Interactive Patient Education [employer].

## 2016-01-09 ENCOUNTER — Encounter (HOSPITAL_COMMUNITY)
Admission: RE | Admit: 2016-01-09 | Discharge: 2016-01-09 | Disposition: A | Discharge status: Home / Self Care | Payer: Medicare Other | Source: Ambulatory Visit | Attending: Gastroenterology | Admitting: Gastroenterology

## 2016-01-09 ENCOUNTER — Encounter (HOSPITAL_COMMUNITY): Payer: Self-pay

## 2016-01-09 DIAGNOSIS — K5090 Crohn's disease, unspecified, without complications: Secondary | ICD-10-CM | POA: Diagnosis not present

## 2016-01-09 DIAGNOSIS — Z5181 Encounter for therapeutic drug level monitoring: Secondary | ICD-10-CM | POA: Insufficient documentation

## 2016-01-09 MED ORDER — METHYLPREDNISOLONE SODIUM SUCC 40 MG IJ SOLR
20.0000 mg | INTRAMUSCULAR | Status: AC
  Administered 2016-01-09: 20 mg via INTRAVENOUS
  Filled 2016-01-09: qty 0.32
  Filled 2016-01-09 (×2): qty 1

## 2016-01-09 MED ORDER — ACETAMINOPHEN 500 MG PO TABS
1000.0000 mg | ORAL_TABLET | ORAL | Status: AC
  Administered 2016-01-09: 1000 mg via ORAL
  Filled 2016-01-09: qty 2

## 2016-01-09 MED ORDER — SODIUM CHLORIDE 0.9 % IV SOLN
400.0000 mg | INTRAVENOUS | Status: AC
  Administered 2016-01-09: 400 mg via INTRAVENOUS
  Filled 2016-01-09: qty 40

## 2016-01-09 MED ORDER — SODIUM CHLORIDE 0.9 % IV SOLN
INTRAVENOUS | Status: AC
  Administered 2016-01-09: 08:00:00 via INTRAVENOUS

## 2016-01-30 ENCOUNTER — Other Ambulatory Visit: Payer: Self-pay | Admitting: Pulmonary Disease

## 2016-02-18 ENCOUNTER — Encounter: Payer: Self-pay | Admitting: Pulmonary Disease

## 2016-02-18 ENCOUNTER — Ambulatory Visit (INDEPENDENT_AMBULATORY_CARE_PROVIDER_SITE_OTHER): Payer: Medicare Other | Admitting: Pulmonary Disease

## 2016-02-18 VITALS — BP 122/72 | HR 75 | Ht 64.0 in | Wt 208.4 lb

## 2016-02-18 DIAGNOSIS — J4540 Moderate persistent asthma, uncomplicated: Secondary | ICD-10-CM

## 2016-02-18 NOTE — Progress Notes (Signed)
Subjective:    Patient ID: [name], female    DOB: 1951/03/11, 65 y.o.   MRN: [id number]  HPI Follow-up for moderate persistent asthma.  [name] is a 65 year old with moderate persistent asthma, sleep apnea. She is a former patient of Dr. Joya Gaskins. She is feeling well with no exacerbations, ER visits or hospitalizations. Denies any dyspnea, wheezing.   Past Medical History  Diagnosis Date  . Allergic rhinitis   . Asthma   . Hypertension   . Small cell B-cell lymphoma ([place])     R supra hilar mass- Needle Bx positive 2006  . Crohn's disease ([facility])   . HTN (hypertension)   . Chronic anemia   . Type 2 diabetes mellitus ([facility])   . Sleep apnea     pt denies this  . Bilateral sciatica     nothing recently 4/`/15    Current outpatient prescriptions:  .  acetaminophen (TYLENOL ARTHRITIS PAIN) 650 MG CR tablet, Take 1,300 mg by mouth every 8 (eight) hours as needed for pain., Disp: , Rfl:  .  ADVAIR DISKUS 100-50 MCG/DOSE AEPB, inhale 1 dose by mouth twice a day, Disp: 60 each, Rfl: 11 .  aspirin EC 81 MG tablet, Take 81 mg by mouth daily., Disp: , Rfl:  .  Calcium Carbonate-Vitamin D (CALCIUM 600 + D PO), Take 1 tablet by mouth daily. , Disp: , Rfl:  .  Canagliflozin (INVOKANA) 300 MG TABS, Take 300 mg by mouth daily., Disp: , Rfl:  .  estradiol (ESTRACE) 1 MG tablet, Take 1 mg by mouth daily., Disp: , Rfl:  .  fluconazole (DIFLUCAN) 100 MG tablet, Take 100 mg by mouth every 7 (seven) days., Disp: , Rfl:  .  fluticasone (FLONASE) 50 MCG/ACT nasal spray, Place 2 sprays into both nostrils daily., Disp: 16 g, Rfl: 0 .  InFLIXimab (REMICADE IV), Inject into the vein every 8 (eight) weeks. For Crohn's, Disp: , Rfl:  .  montelukast (SINGULAIR) 10 MG tablet, Take 1 tablet by mouth at  bedtime, Disp: 90 tablet, Rfl: 1 .  Multiple Vitamin (MULTIVITAMIN WITH MINERALS) TABS, Take 1 tablet by mouth daily., Disp: , Rfl:  .  olmesartan-hydrochlorothiazide (BENICAR HCT) 20-12.5 MG per  tablet, Take 1 tablet by mouth daily.  , Disp: , Rfl:  .  omeprazole (PRILOSEC) 20 MG capsule, Take 1 capsule by mouth  daily, Disp: 90 capsule, Rfl: 1 .  OVER THE COUNTER MEDICATION, Take 2 tablets by mouth daily. Sinus Medication., Disp: , Rfl:  .  pregabalin (LYRICA) 50 MG capsule, Take 50 mg by mouth 2 (two) times daily., Disp: , Rfl:  .  rosuvastatin (CRESTOR) 20 MG tablet, Take 20 mg by mouth daily., Disp: , Rfl:  .  sertraline (ZOLOFT) 100 MG tablet, Take 100 mg by mouth daily.  , Disp: , Rfl:  .  albuterol (PROAIR HFA) 108 (90 BASE) MCG/ACT inhaler, Inhale 2 puffs into the lungs every 6 (six) hours as needed for wheezing or shortness of breath. (Patient not taking: Reported on 08/14/2015), Disp: 1 Inhaler, Rfl: 3  Review of Systems  Constitutional: Negative for fever and unexpected weight change.  HENT: Negative for congestion, dental problem, ear pain, nosebleeds, postnasal drip, rhinorrhea, sinus pressure, sneezing, sore throat and trouble swallowing.   Eyes: Negative for redness and itching.  Respiratory: Positive for cough, chest tightness and wheezing. Negative for shortness of breath.   Cardiovascular: Negative for palpitations and leg swelling.  Gastrointestinal: Negative for nausea and vomiting.  Genitourinary: Negative for dysuria.  Musculoskeletal: Negative for joint swelling.  Skin: Negative for rash.  Neurological: Negative for headaches.  Hematological: Does not bruise/bleed easily.  Psychiatric/Behavioral: Negative for dysphoric mood. The patient is not nervous/anxious.    Blood pressure 122/72, pulse 75, height 5' 4"  (1.626 m), weight 208 lb 6.4 oz (94.53 kg), SpO2 97 %.    Objective:   Physical Exam  Constitutional: She is oriented to person, place, and time. She appears well-developed and well-nourished.  HENT:  Mouth/Throat: Oropharynx is clear and moist.  Eyes: Pupils are equal, round, and reactive to light.  Neck: Normal range of motion. Neck supple.    Cardiovascular: Normal rate, regular rhythm and normal heart sounds.   Pulmonary/Chest: Effort normal. No respiratory distress. She has no wheezes.  Abdominal: Soft. Bowel sounds are normal.  Musculoskeletal: Normal range of motion.  Neurological: She is alert and oriented to person, place, and time.  Skin: Skin is warm and dry.       Assessment & Plan:   #1. Moderate persistent asthma. Stable clinically Recommended to continue inhalers, Singulair, Flonase as prescribed. We will start on albuterol rescue inhaler as she has occasional wheezing  #2. Sleep apnea Not using CPAP for several years as she feels asymptomatic.    Ashley Garfinkel MD [facility] Pager [phone] If no answer or after 3pm call: [phone] 02/18/2016, 11:46 AM

## 2016-02-18 NOTE — Patient Instructions (Signed)
Continue the Advair, singulair, Flonase. We give you an albuterol rescue inhaler.  Return to clinic in 6 months

## 2016-02-27 ENCOUNTER — Other Ambulatory Visit (HOSPITAL_BASED_OUTPATIENT_CLINIC_OR_DEPARTMENT_OTHER): Payer: Medicare Other

## 2016-02-27 ENCOUNTER — Ambulatory Visit (HOSPITAL_BASED_OUTPATIENT_CLINIC_OR_DEPARTMENT_OTHER): Payer: Medicare Other | Admitting: Oncology

## 2016-02-27 ENCOUNTER — Telehealth: Payer: Self-pay | Admitting: Oncology

## 2016-02-27 VITALS — BP 122/47 | HR 73 | Temp 98.1°F | Resp 18 | Ht 64.0 in | Wt 209.0 lb

## 2016-02-27 DIAGNOSIS — K5090 Crohn's disease, unspecified, without complications: Secondary | ICD-10-CM

## 2016-02-27 DIAGNOSIS — C8590 Non-Hodgkin lymphoma, unspecified, unspecified site: Secondary | ICD-10-CM

## 2016-02-27 DIAGNOSIS — Z8572 Personal history of non-Hodgkin lymphomas: Secondary | ICD-10-CM

## 2016-02-27 DIAGNOSIS — Z23 Encounter for immunization: Secondary | ICD-10-CM

## 2016-02-27 LAB — CBC WITH DIFFERENTIAL/PLATELET
BASO%: 1.2 % (ref 0.0–2.0)
Basophils Absolute: 0.1 10e3/uL (ref 0.0–0.1)
EOS%: 3.8 % (ref 0.0–7.0)
Eosinophils Absolute: 0.3 10e3/uL (ref 0.0–0.5)
HCT: 41.8 % (ref 34.8–46.6)
HGB: 14.1 g/dL (ref 11.6–15.9)
LYMPH%: 29.9 % (ref 14.0–49.7)
MCH: 32.3 pg (ref 25.1–34.0)
MCHC: 33.6 g/dL (ref 31.5–36.0)
MCV: 96.1 fL (ref 79.5–101.0)
MONO#: 0.5 10e3/uL (ref 0.1–0.9)
MONO%: 7.3 % (ref 0.0–14.0)
NEUT#: 4.3 10e3/uL (ref 1.5–6.5)
NEUT%: 57.8 % (ref 38.4–76.8)
Platelets: 277 10e3/uL (ref 145–400)
RBC: 4.35 10e6/uL (ref 3.70–5.45)
RDW: 13.8 % (ref 11.2–14.5)
WBC: 7.4 10e3/uL (ref 3.9–10.3)
lymph#: 2.2 10e3/uL (ref 0.9–3.3)

## 2016-02-27 LAB — BASIC METABOLIC PANEL
Anion Gap: 7 mEq/L (ref 3–11)
BUN: 12.7 mg/dL (ref 7.0–26.0)
CHLORIDE: 104 meq/L (ref 98–109)
CO2: 29 mEq/L (ref 22–29)
Calcium: 9.4 mg/dL (ref 8.4–10.4)
Creatinine: 0.9 mg/dL (ref 0.6–1.1)
EGFR: 77 mL/min/{1.73_m2} — AB (ref >90)
Glucose: 149 mg/dl — ABNORMAL HIGH (ref 70–140)
POTASSIUM: 3.9 meq/L (ref 3.5–5.1)
Sodium: 140 mEq/L (ref 136–145)

## 2016-02-27 NOTE — Progress Notes (Signed)
  [facility] OFFICE PROGRESS NOTE   Diagnosis:  Non-Hodgkin's lymphoma, serum monoclonal protein  INTERVAL HISTORY:    Ms. Huntsberry returns as scheduled. She feels well. No bleeding. She continues every 8 week Remicade. Good appetite.  Objective:  Vital signs in last 24 hours:  Blood pressure 122/47, pulse 73, temperature 98.1 F (36.7 C), temperature source Oral, resp. rate 18, height 5' 4"  (1.626 m), weight 209 lb (94.802 kg), SpO2 93 %.    HEENT:  Neck without mass Lymphatics:  No cervical, supra-clavicular, axillary, or inguinal nodes Resp:  Lungs  With scattered coarse rhonchi, no respiratory distress Cardio:  Regular rate and rhythm GI:  No  hepatosplenomegaly Vascular:  No leg edema   Lab Results:  Lab Results  Component Value Date   WBC 7.4 02/27/2016   HGB 14.1 02/27/2016   HCT 41.8 02/27/2016   MCV 96.1 02/27/2016   PLT 277 02/27/2016   NEUTROABS 4.3 02/27/2016     Medications: I have reviewed the patient's current medications.  Assessment/Plan: 1. Non-Hodgkin lymphoma, low grade lymphoma involving the lungs, diagnosed in June 2006. No clinical evidence of progression. 2. Crohn's disease, followed by Dr. Wynetta Emery.She continues Remicade 3. Post thoracotomy pain, persistent, initially improved with Elavil 4. Anemia secondary to chronic disease and gastrointestinal bleeding. Hemoglobin is improved. 5. History of thrombocytosis. 6. History of anorexia/weight loss in the setting of active Crohn disease. Improved. 7. History of a serum monoclonal protein, stable on repeat serum protein electrophoresis studies, last in October 2016 8. History of asthma. .   Disposition:   Ms. Rossano remains in clinical remission from non-Hodgkin's lymphoma. No evidence for development of myeloma. We will follow-up on the serum protein electrophoresis from today. She will return for an office and lab visit in 8 months.  [name], MD  02/27/2016  11:47  AM

## 2016-02-27 NOTE — Telephone Encounter (Signed)
per pof to sch pt appt-gave pt copy of avs °

## 2016-03-02 LAB — PROTEIN ELECTROPHORESIS, SERUM
A/G RATIO SPE: 1.1 (ref 0.7–1.7)
ALBUMIN: 3.6 g/dL (ref 2.9–4.4)
Alpha 1: 0.1 g/dL (ref 0.0–0.4)
Alpha 2: 0.6 g/dL (ref 0.4–1.0)
Beta: 1.2 g/dL (ref 0.7–1.3)
Gamma Globulin: 1.3 g/dL (ref 0.4–1.8)
Globulin, Total: 3.3 g/dL (ref 2.2–3.9)
M-Spike, %: 0.4 g/dL — ABNORMAL HIGH
TOTAL PROTEIN: 6.9 g/dL (ref 6.0–8.5)

## 2016-03-05 ENCOUNTER — Encounter (HOSPITAL_COMMUNITY)
Admission: RE | Admit: 2016-03-05 | Discharge: 2016-03-05 | Disposition: A | Discharge status: Home / Self Care | Payer: Medicare Other | Source: Ambulatory Visit | Attending: Gastroenterology | Admitting: Gastroenterology

## 2016-03-05 ENCOUNTER — Encounter (HOSPITAL_COMMUNITY): Payer: Self-pay

## 2016-03-05 DIAGNOSIS — K5090 Crohn's disease, unspecified, without complications: Secondary | ICD-10-CM | POA: Insufficient documentation

## 2016-03-05 DIAGNOSIS — Z5181 Encounter for therapeutic drug level monitoring: Secondary | ICD-10-CM | POA: Diagnosis not present

## 2016-03-05 MED ORDER — METHYLPREDNISOLONE SODIUM SUCC 40 MG IJ SOLR
20.0000 mg | INTRAMUSCULAR | Status: DC
  Administered 2016-03-05: 20 mg via INTRAVENOUS
  Filled 2016-03-05: qty 1

## 2016-03-05 MED ORDER — SODIUM CHLORIDE 0.9 % IV SOLN
400.0000 mg | INTRAVENOUS | Status: DC
  Administered 2016-03-05: 400 mg via INTRAVENOUS
  Filled 2016-03-05: qty 40

## 2016-03-05 MED ORDER — SODIUM CHLORIDE 0.9 % IV SOLN
INTRAVENOUS | Status: DC
  Administered 2016-03-05: 08:00:00 via INTRAVENOUS

## 2016-03-05 MED ORDER — ACETAMINOPHEN 500 MG PO TABS
1000.0000 mg | ORAL_TABLET | ORAL | Status: DC
  Administered 2016-03-05: 1000 mg via ORAL
  Filled 2016-03-05: qty 2

## 2016-03-05 NOTE — Discharge Instructions (Signed)
Infliximab injection What is this medicine? INFLIXIMAB (in [place]aken[place] i mab) is used to treat Crohn's disease and ulcerative colitis. It is also used to treat ankylosing spondylitis, psoriasis, and some forms of arthritis. This medicine may be used for other purposes; ask your health care provider or pharmacist if you have questions. What should I tell my health care provider before I take this medicine? They need to know if you have any of these conditions: -diabetes -exposure to tuberculosis -heart failure -hepatitis or liver disease -immune system problems -infection -lung or breathing disease, like COPD -multiple sclerosis -current or past resident of [place] or [place] -seizure disorder -an unusual or allergic reaction to infliximab, mouse proteins, other medicines, foods, dyes, or preservatives -pregnant or trying to get pregnant -breast-feeding How should I use this medicine? This medicine is for injection into a vein. It is usually given by a health care professional in a hospital or clinic setting. A special MedGuide will be given to you by the pharmacist with each prescription and refill. Be sure to read this information carefully each time. Talk to your pediatrician regarding the use of this medicine in children. Special care may be needed. Overdosage: If you think you have taken too much of this medicine contact a poison control center or emergency room at once. NOTE: This medicine is only for you. Do not share this medicine with others. What if I miss a dose? It is important not to miss your dose. Call your doctor or health care professional if you are unable to keep an appointment. What may interact with this medicine? Do not take this medicine with any of the following medications: -anakinra -rilonacept This medicine may also interact with the following medications: -vaccines This list may not describe all possible interactions. Give your health care provider  a list of all the medicines, herbs, non-prescription drugs, or dietary supplements you use. Also tell them if you smoke, drink alcohol, or use illegal drugs. Some items may interact with your medicine. What should I watch for while using this medicine? Visit your doctor or health care professional for regular checks on your progress. If you get a cold or other infection while receiving this medicine, call your doctor or health care professional. Do not treat yourself. This medicine may decrease your body's ability to fight infections. Before beginning therapy, your doctor may do a test to see if you have been exposed to tuberculosis. This medicine may make the symptoms of heart failure worse in some patients. If you notice symptoms such as increased shortness of breath or swelling of the ankles or legs, contact your health care provider right away. If you are going to have surgery or dental work, tell your health care professional or dentist that you have received this medicine. If you take this medicine for plaque psoriasis, stay out of the sun. If you cannot avoid being in the sun, wear protective clothing and use sunscreen. Do not use sun lamps or tanning beds/booths. What side effects may I notice from receiving this medicine? Side effects that you should report to your doctor or health care professional as soon as possible: -allergic reactions like skin rash, itching or hives, swelling of the face, lips, or tongue -chest pain -fever or chills, usually related to the infusion -muscle or joint pain -red, scaly patches or raised bumps on the skin -signs of infection - fever or chills, cough, sore throat, pain or difficulty passing urine -swollen lymph nodes in the neck, underarm,  or groin areas -unexplained weight loss -unusual bleeding or bruising -unusually weak or tired -yellowing of the eyes or skin Side effects that usually do not require medical attention (report to your doctor or health  care professional if they continue or are bothersome): -headache -heartburn or stomach pain -nausea, vomiting This list may not describe all possible side effects. Call your doctor for medical advice about side effects. You may report side effects to FDA at 1-800-FDA-1088. Where should I keep my medicine? This drug is given in a hospital or clinic and will not be stored at home. NOTE: This sheet is a summary. It may not cover all possible information. If you have questions about this medicine, talk to your doctor, pharmacist, or health care provider.    2016, Elsevier/Gold Standard. (2008-06-27 10:26:02)

## 2016-05-07 ENCOUNTER — Encounter (HOSPITAL_COMMUNITY): Payer: Self-pay

## 2016-05-07 ENCOUNTER — Encounter (HOSPITAL_COMMUNITY)
Admission: RE | Admit: 2016-05-07 | Discharge: 2016-05-07 | Disposition: A | Discharge status: Home / Self Care | Payer: Medicare Other | Source: Ambulatory Visit | Attending: Gastroenterology | Admitting: Gastroenterology

## 2016-05-07 DIAGNOSIS — Z5181 Encounter for therapeutic drug level monitoring: Secondary | ICD-10-CM | POA: Insufficient documentation

## 2016-05-07 DIAGNOSIS — K5090 Crohn's disease, unspecified, without complications: Secondary | ICD-10-CM | POA: Diagnosis present

## 2016-05-07 MED ORDER — SODIUM CHLORIDE 0.9 % IV SOLN
INTRAVENOUS | Status: AC
  Administered 2016-05-07: 13:00:00 via INTRAVENOUS

## 2016-05-07 MED ORDER — ACETAMINOPHEN 500 MG PO TABS
1000.0000 mg | ORAL_TABLET | ORAL | Status: AC
  Administered 2016-05-07: 1000 mg via ORAL
  Filled 2016-05-07: qty 2

## 2016-05-07 MED ORDER — SODIUM CHLORIDE 0.9 % IV SOLN
400.0000 mg | INTRAVENOUS | Status: AC
  Administered 2016-05-07: 400 mg via INTRAVENOUS
  Filled 2016-05-07: qty 40

## 2016-05-07 MED ORDER — METHYLPREDNISOLONE SODIUM SUCC 40 MG IJ SOLR
20.0000 mg | INTRAMUSCULAR | Status: AC
  Administered 2016-05-07: 20 mg via INTRAVENOUS
  Filled 2016-05-07: qty 1

## 2016-05-07 NOTE — Progress Notes (Signed)
Pt arrived on time, but nurse late to room due to being busy in another room.  Apologized to pt for the delay in care.  Pt states that's okay.  Remicade questions asked and medicine released at this time.

## 2016-07-02 ENCOUNTER — Encounter (HOSPITAL_COMMUNITY)
Admission: RE | Admit: 2016-07-02 | Discharge: 2016-07-02 | Disposition: A | Discharge status: Home / Self Care | Payer: Medicare Other | Source: Ambulatory Visit | Attending: Gastroenterology | Admitting: Gastroenterology

## 2016-07-02 ENCOUNTER — Encounter (HOSPITAL_COMMUNITY): Payer: Self-pay

## 2016-07-02 DIAGNOSIS — Z5181 Encounter for therapeutic drug level monitoring: Secondary | ICD-10-CM | POA: Insufficient documentation

## 2016-07-02 DIAGNOSIS — K5090 Crohn's disease, unspecified, without complications: Secondary | ICD-10-CM | POA: Insufficient documentation

## 2016-07-02 MED ORDER — ACETAMINOPHEN 500 MG PO TABS
1000.0000 mg | ORAL_TABLET | ORAL | Status: DC
  Administered 2016-07-02: 1000 mg via ORAL
  Filled 2016-07-02: qty 2

## 2016-07-02 MED ORDER — METHYLPREDNISOLONE SODIUM SUCC 40 MG IJ SOLR
20.0000 mg | INTRAMUSCULAR | Status: DC
  Administered 2016-07-02: 20 mg via INTRAVENOUS
  Filled 2016-07-02: qty 1

## 2016-07-02 MED ORDER — SODIUM CHLORIDE 0.9 % IV SOLN
INTRAVENOUS | Status: DC
  Administered 2016-07-02: 11:00:00 via INTRAVENOUS

## 2016-07-02 MED ORDER — SODIUM CHLORIDE 0.9 % IV SOLN
400.0000 mg | INTRAVENOUS | Status: DC
  Administered 2016-07-02: 400 mg via INTRAVENOUS
  Filled 2016-07-02: qty 40

## 2016-08-05 ENCOUNTER — Telehealth: Payer: Self-pay | Admitting: Pulmonary Disease

## 2016-08-05 MED ORDER — FLUTICASONE-SALMETEROL 100-50 MCG/DOSE IN AEPB: INHALATION_SPRAY | RESPIRATORY_TRACT | 5 refills | Status: DC

## 2016-08-05 NOTE — Telephone Encounter (Signed)
Spoke with pt. She is needing a refill on Advair. This has been sent in. Nothing further was needed.

## 2016-08-18 ENCOUNTER — Ambulatory Visit (INDEPENDENT_AMBULATORY_CARE_PROVIDER_SITE_OTHER): Payer: Medicare Other | Admitting: Pulmonary Disease

## 2016-08-18 ENCOUNTER — Encounter: Payer: Self-pay | Admitting: Pulmonary Disease

## 2016-08-18 VITALS — BP 130/82 | HR 70 | Ht 64.0 in | Wt 204.8 lb

## 2016-08-18 DIAGNOSIS — J4540 Moderate persistent asthma, uncomplicated: Secondary | ICD-10-CM | POA: Diagnosis not present

## 2016-08-18 DIAGNOSIS — Z23 Encounter for immunization: Secondary | ICD-10-CM | POA: Diagnosis not present

## 2016-08-18 NOTE — Progress Notes (Signed)
[name]    [id number]    [birth year]  Primary Care Physician:GATES,ROBERT NEVILL, MD  Referring Physician: Josetta Huddle, MD [address] [place], [place] [address]  Chief complaint:  Follow-up for moderate persistent asthma.  HPI: Mrs. [name] is a 65 year old with moderate persistent asthma, sleep apnea. She is a former patient of Dr. Joya Gaskins. She is feeling well with no exacerbations, ER visits or hospitalizations. Denies any dyspnea, wheezing. She is maintained on Advair 100/50. She does not have a rescue inhaler even though it has been prescribed.  Outpatient Encounter Prescriptions as of 08/18/2016  Medication Sig  . acetaminophen (TYLENOL ARTHRITIS PAIN) 650 MG CR tablet Take 1,300 mg by mouth every 8 (eight) hours as needed for pain.  [name] albuterol (PROAIR HFA) 108 (90 BASE) MCG/ACT inhaler Inhale 2 puffs into the lungs every 6 (six) hours as needed for wheezing or shortness of breath.  [name] aspirin EC 81 MG tablet Take 81 mg by mouth daily.  . Canagliflozin (INVOKANA) 300 MG TABS Take 300 mg by mouth daily.  . Cholecalciferol 1000 units TBDP Take 1,000 Units by mouth daily.  [name] estradiol (ESTRACE) 1 MG tablet Take 1 mg by mouth daily.  . fluconazole (DIFLUCAN) 100 MG tablet Take 100 mg by mouth every 7 (seven) days.  . fluticasone (FLONASE) 50 MCG/ACT nasal spray Place 2 sprays into both nostrils daily.  . Fluticasone-Salmeterol (ADVAIR DISKUS) 100-50 MCG/DOSE AEPB inhale 1 dose by mouth twice a day  . InFLIXimab (REMICADE IV) Inject into the vein every 8 (eight) weeks. For Crohn's  . losartan-hydrochlorothiazide (HYZAAR) 100-12.5 MG tablet Take 1 tablet by mouth daily.  . montelukast (SINGULAIR) 10 MG tablet Take 1 tablet by mouth at  bedtime  . Multiple Vitamin (MULTIVITAMIN WITH MINERALS) TABS Take 1 tablet by mouth daily.  [name] omeprazole (PRILOSEC) 20 MG capsule Take 1 capsule by mouth  daily  . pregabalin (LYRICA) 50 MG capsule Take 50 mg by mouth 2  (two) times daily.  . rosuvastatin (CRESTOR) 20 MG tablet Take 20 mg by mouth daily.  . sertraline (ZOLOFT) 100 MG tablet Take 100 mg by mouth daily.    [name] OVER THE COUNTER MEDICATION Take 2 tablets by mouth daily as needed. Sinus Medication.  . [DISCONTINUED] Calcium Carbonate-Vitamin D (CALCIUM 600 + D PO) Take 1 tablet by mouth daily.   . [DISCONTINUED] olmesartan-hydrochlorothiazide (BENICAR HCT) 20-12.5 MG per tablet Take 1 tablet by mouth daily.     No facility-administered encounter medications on file as of 08/18/2016.     Allergies as of 08/18/2016  . (No Known Allergies)    Past Medical History:  Diagnosis Date  . Allergic rhinitis   . Asthma   . Bilateral sciatica    nothing recently 4/`/15  . Chronic anemia   . Crohn's disease ([facility])   . HTN (hypertension)   . Hypertension   . Sleep apnea    pt denies this  . Small cell B-cell lymphoma ([facility])    R supra hilar mass- Needle Bx positive 2006  . Type 2 diabetes mellitus ([facility])     Past Surgical History:  Procedure Laterality Date  . ABDOMINAL HYSTERECTOMY  1988  . VESICOVAGINAL FISTULA CLOSURE W/ TAH  May 1988    Family History  Problem Relation Age of Onset  . Diabetes Mother   . Liver cancer Mother   . Heart attack Father     Social History   Social  History  . Marital status: Married    Spouse name: N/A  . Number of children: 2  . Years of education: N/A   Occupational History  . disabled    Social History Main Topics  . Smoking status: Never Smoker  . Smokeless tobacco: Never Used  . Alcohol use No  . Drug use: Unknown  . Sexual activity: Not on file   Other Topics Concern  . Not on file   Social History Narrative  . No narrative on file     Review of systems: Review of Systems  Constitutional: Negative for fever and chills.  HENT: Negative.   Eyes: Negative for blurred vision.  Respiratory: as per HPI  Cardiovascular: Negative for chest pain and palpitations.  Gastrointestinal:  Negative for vomiting, diarrhea, blood per rectum. Genitourinary: Negative for dysuria, urgency, frequency and hematuria.  Musculoskeletal: Negative for myalgias, back pain and joint pain.  Skin: Negative for itching and rash.  Neurological: Negative for dizziness, tremors, focal weakness, seizures and loss of consciousness.  Endo/Heme/Allergies: Negative for environmental allergies.  Psychiatric/Behavioral: Negative for depression, suicidal ideas and hallucinations.  All other systems reviewed and are negative.   Physical Exam: Blood pressure 130/82, pulse 70, height 5' 4"  (1.626 m), weight 204 lb 12.8 oz (92.9 kg), SpO2 95 %. Gen:      No acute distress HEENT:  EOMI, sclera anicteric Neck:     No masses; no thyromegaly Lungs:    Clear to auscultation bilaterally; normal respiratory effort CV:         Regular rate and rhythm; no murmurs Abd:      + bowel sounds; soft, non-tender; no palpable masses, no distension Ext:    No edema; adequate peripheral perfusion Skin:      Warm and dry; no rash Neuro: alert and oriented x 3 Psych: normal mood and affect  Data Reviewed:  Assessment:  #1. Moderate persistent asthma. Stable clinically Recommended to continue inhalers, Singulair, Flonase as prescribed. Prescribe albuterol rescue inhaler. Spirometry at next visit  #2. Sleep apnea Not using CPAP for several years as she feels asymptomatic.  Plan/Recommendations: - Continue advair, Add albuterol PRN  Marshell Garfinkel MD  Pulmonary and Critical Care Pager [phone] 08/18/2016, 10:45 AM  CC: Josetta Huddle, MD

## 2016-08-18 NOTE — Patient Instructions (Signed)
Continue using inhalers as prescribed. You'll receive a flu vaccination today.  Return to clinic in 6 months.

## 2016-08-21 ENCOUNTER — Other Ambulatory Visit: Payer: Self-pay | Admitting: Pulmonary Disease

## 2016-08-27 ENCOUNTER — Encounter (HOSPITAL_COMMUNITY): Payer: Self-pay

## 2016-08-27 ENCOUNTER — Encounter (HOSPITAL_COMMUNITY)
Admission: RE | Admit: 2016-08-27 | Discharge: 2016-08-27 | Disposition: A | Discharge status: Home / Self Care | Payer: Medicare Other | Source: Ambulatory Visit | Attending: Gastroenterology | Admitting: Gastroenterology

## 2016-08-27 DIAGNOSIS — K5090 Crohn's disease, unspecified, without complications: Secondary | ICD-10-CM | POA: Insufficient documentation

## 2016-08-27 DIAGNOSIS — Z5181 Encounter for therapeutic drug level monitoring: Secondary | ICD-10-CM | POA: Diagnosis not present

## 2016-08-27 MED ORDER — METHYLPREDNISOLONE SODIUM SUCC 40 MG IJ SOLR
20.0000 mg | Freq: Once | INTRAMUSCULAR | Status: AC
  Administered 2016-08-27: 20 mg via INTRAVENOUS
  Filled 2016-08-27: qty 1

## 2016-08-27 MED ORDER — SODIUM CHLORIDE 0.9 % IV SOLN
400.0000 mg | Freq: Once | INTRAVENOUS | Status: AC
  Administered 2016-08-27: 400 mg via INTRAVENOUS
  Filled 2016-08-27: qty 40

## 2016-08-27 MED ORDER — SODIUM CHLORIDE 0.9 % IV SOLN
Freq: Once | INTRAVENOUS | Status: AC
  Administered 2016-08-27: 09:00:00 via INTRAVENOUS

## 2016-08-27 MED ORDER — ACETAMINOPHEN 500 MG PO TABS
1000.0000 mg | ORAL_TABLET | Freq: Once | ORAL | Status: AC
  Administered 2016-08-27: 1000 mg via ORAL
  Filled 2016-08-27: qty 2

## 2016-08-29 ENCOUNTER — Other Ambulatory Visit: Payer: Self-pay | Admitting: Pulmonary Disease

## 2016-10-06 ENCOUNTER — Other Ambulatory Visit: Payer: Self-pay | Admitting: Obstetrics & Gynecology

## 2016-10-06 DIAGNOSIS — Z1231 Encounter for screening mammogram for malignant neoplasm of breast: Secondary | ICD-10-CM

## 2016-10-22 ENCOUNTER — Encounter (HOSPITAL_COMMUNITY): Payer: Self-pay

## 2016-10-22 ENCOUNTER — Encounter (HOSPITAL_COMMUNITY)
Admission: RE | Admit: 2016-10-22 | Discharge: 2016-10-22 | Disposition: A | Discharge status: Home / Self Care | Payer: Medicare Other | Source: Ambulatory Visit | Attending: Gastroenterology | Admitting: Gastroenterology

## 2016-10-22 DIAGNOSIS — K5090 Crohn's disease, unspecified, without complications: Secondary | ICD-10-CM | POA: Diagnosis not present

## 2016-10-22 DIAGNOSIS — Z5181 Encounter for therapeutic drug level monitoring: Secondary | ICD-10-CM | POA: Insufficient documentation

## 2016-10-22 MED ORDER — SODIUM CHLORIDE 0.9 % IV SOLN
400.0000 mg | INTRAVENOUS | Status: DC
  Administered 2016-10-22: 400 mg via INTRAVENOUS
  Filled 2016-10-22: qty 40

## 2016-10-22 MED ORDER — SODIUM CHLORIDE 0.9 % IV SOLN
INTRAVENOUS | Status: DC
  Administered 2016-10-22: 10:00:00 via INTRAVENOUS

## 2016-10-22 MED ORDER — METHYLPREDNISOLONE SODIUM SUCC 40 MG IJ SOLR
20.0000 mg | INTRAMUSCULAR | Status: DC
  Administered 2016-10-22: 20 mg via INTRAVENOUS
  Filled 2016-10-22: qty 1

## 2016-10-22 MED ORDER — ACETAMINOPHEN 500 MG PO TABS
1000.0000 mg | ORAL_TABLET | ORAL | Status: DC
  Administered 2016-10-22: 1000 mg via ORAL
  Filled 2016-10-22: qty 2

## 2016-10-29 ENCOUNTER — Other Ambulatory Visit (HOSPITAL_BASED_OUTPATIENT_CLINIC_OR_DEPARTMENT_OTHER): Payer: Medicare Other

## 2016-10-29 ENCOUNTER — Telehealth: Payer: Self-pay | Admitting: Oncology

## 2016-10-29 ENCOUNTER — Ambulatory Visit (HOSPITAL_BASED_OUTPATIENT_CLINIC_OR_DEPARTMENT_OTHER): Payer: Medicare Other | Admitting: Oncology

## 2016-10-29 VITALS — BP 141/64 | HR 68 | Temp 98.1°F | Resp 18 | Ht 64.0 in | Wt 203.5 lb

## 2016-10-29 DIAGNOSIS — C8590 Non-Hodgkin lymphoma, unspecified, unspecified site: Secondary | ICD-10-CM

## 2016-10-29 DIAGNOSIS — C8519 Unspecified B-cell lymphoma, extranodal and solid organ sites: Secondary | ICD-10-CM

## 2016-10-29 DIAGNOSIS — Z8572 Personal history of non-Hodgkin lymphomas: Secondary | ICD-10-CM

## 2016-10-29 DIAGNOSIS — K5090 Crohn's disease, unspecified, without complications: Secondary | ICD-10-CM

## 2016-10-29 LAB — CBC WITH DIFFERENTIAL/PLATELET
BASO%: 0.4 % (ref 0.0–2.0)
BASOS ABS: 0 10*3/uL (ref 0.0–0.1)
EOS%: 3 % (ref 0.0–7.0)
Eosinophils Absolute: 0.2 10*3/uL (ref 0.0–0.5)
HEMATOCRIT: 40.7 % (ref 34.8–46.6)
HGB: 13.6 g/dL (ref 11.6–15.9)
LYMPH#: 2.2 10*3/uL (ref 0.9–3.3)
LYMPH%: 28.1 % (ref 14.0–49.7)
MCH: 32.7 pg (ref 25.1–34.0)
MCHC: 33.4 g/dL (ref 31.5–36.0)
MCV: 97.8 fL (ref 79.5–101.0)
MONO#: 0.7 10*3/uL (ref 0.1–0.9)
MONO%: 9.1 % (ref 0.0–14.0)
NEUT#: 4.6 10*3/uL (ref 1.5–6.5)
NEUT%: 59.4 % (ref 38.4–76.8)
PLATELETS: 290 10*3/uL (ref 145–400)
RBC: 4.16 10*6/uL (ref 3.70–5.45)
RDW: 13.5 % (ref 11.2–14.5)
WBC: 7.8 10*3/uL (ref 3.9–10.3)

## 2016-10-29 LAB — BASIC METABOLIC PANEL
Anion Gap: 11 mEq/L (ref 3–11)
BUN: 12.2 mg/dL (ref 7.0–26.0)
CHLORIDE: 102 meq/L (ref 98–109)
CO2: 28 mEq/L (ref 22–29)
Calcium: 9.6 mg/dL (ref 8.4–10.4)
Creatinine: 0.9 mg/dL (ref 0.6–1.1)
EGFR: 81 mL/min/{1.73_m2} — ABNORMAL LOW (ref >90)
GLUCOSE: 146 mg/dL — AB (ref 70–140)
POTASSIUM: 3.9 meq/L (ref 3.5–5.1)
Sodium: 140 mEq/L (ref 136–145)

## 2016-10-29 NOTE — Telephone Encounter (Signed)
Appointments scheduled for 1 year, per 10/29/16 los. A copy of the appointment schedule and AVS report was given to the patient per 10/29/16 los.

## 2016-10-29 NOTE — Progress Notes (Signed)
  [facility] OFFICE PROGRESS NOTE   Diagnosis: Non-Hodgkin's lymphoma, serum monoclonal protein  INTERVAL HISTORY:   [name] returns as scheduled. She continues to have upper airway congestion that she relates to "allergies ". No rectal bleeding. She is treated with Hurricaine every 8 weeks. No other complaint. Good appetite. No fever or night sweats.  Objective:  Vital signs in last 24 hours:  Blood pressure (!) 141/64, pulse 68, temperature 98.1 F (36.7 C), temperature source Oral, resp. rate 18, height 5' 4"  (1.626 m), weight 203 lb 8 oz (92.3 kg), SpO2 97 %.    HEENT: Neck without mass Lymphatics: No cervical, supra-clavicular, axillary, or inguinal nodes Resp: Lungs clear bilaterally Cardio: Regular rate and rhythm GI: No hepatosplenomegaly, no mass, nontender Vascular: No leg edema, left lower leg is slightly larger than the right side   Lab Results:  Lab Results  Component Value Date   WBC 7.8 10/29/2016   HGB 13.6 10/29/2016   HCT 40.7 10/29/2016   MCV 97.8 10/29/2016   PLT 290 10/29/2016   NEUTROABS 4.6 10/29/2016   Serum M spike on 02/27/2016-0.4  Medications: I have reviewed the patient's current medications.  Assessment/Plan: 1. Non-Hodgkin lymphoma, low grade lymphoma involving the lungs, diagnosed in June 2006. No clinical evidence of progression. 2. Crohn's disease, followed by Dr. Wynetta Emery.She continues Remicade 3. Post thoracotomy pain, persistent, initially improved with Elavil 4. Anemia secondary to chronic disease and gastrointestinal bleeding. Hemoglobin is improved. 5. History of thrombocytosis. 6. History of anorexia/weight loss in the setting of active Crohn disease. Improved. 7. History of a serum monoclonal protein, stable on repeat serum protein electrophoresis studies, last in April 2017 8. History of asthma. .    Disposition:  [name] remains in clinical remission from the low-grade non-Hodgkin's lymphoma. We  will follow-up on the serum protein electrophoresis from today. She would like to [facility] at the [facility]. [name] will return for an office and lab visit in one year.  [name], MD  10/29/2016  11:46 AM

## 2016-11-02 LAB — PROTEIN ELECTROPHORESIS, SERUM
A/G Ratio: 1 (ref 0.7–1.7)
Albumin: 3.6 g/dL (ref 2.9–4.4)
Alpha 1: 0.2 g/dL (ref 0.0–0.4)
Alpha 2: 0.8 g/dL (ref 0.4–1.0)
BETA: 1.3 g/dL (ref 0.7–1.3)
GLOBULIN, TOTAL: 3.7 g/dL (ref 2.2–3.9)
Gamma Globulin: 1.4 g/dL (ref 0.4–1.8)
M-Spike, %: 0.5 g/dL — ABNORMAL HIGH
Total Protein: 7.3 g/dL (ref 6.0–8.5)

## 2016-11-03 ENCOUNTER — Telehealth: Payer: Self-pay

## 2016-11-03 NOTE — Telephone Encounter (Signed)
-----   Message from Ladell Pier, MD sent at 11/02/2016  9:35 PM EST ----- Please call patient, serum m-spike is stable, f/u as scheduled

## 2016-11-03 NOTE — Telephone Encounter (Signed)
Left message for pt to call back regarding lab results.

## 2016-11-09 ENCOUNTER — Telehealth: Payer: Self-pay | Admitting: *Deleted

## 2016-11-09 NOTE — Telephone Encounter (Signed)
Patient notified per Dr. Benay Spice that m-spike is stable and to f/u as scheduled.  Patient appreciative of call and has no questions or concerns at this time.

## 2016-11-11 ENCOUNTER — Ambulatory Visit
Admission: RE | Admit: 2016-11-11 | Discharge: 2016-11-11 | Disposition: A | Discharge status: Home / Self Care | Payer: Medicare Other | Source: Ambulatory Visit | Attending: Obstetrics & Gynecology | Admitting: Obstetrics & Gynecology

## 2016-11-11 DIAGNOSIS — Z1231 Encounter for screening mammogram for malignant neoplasm of breast: Secondary | ICD-10-CM

## 2016-12-17 ENCOUNTER — Encounter (HOSPITAL_COMMUNITY)
Admission: RE | Admit: 2016-12-17 | Discharge: 2016-12-17 | Disposition: A | Discharge status: Home / Self Care | Payer: Medicare Other | Source: Ambulatory Visit | Attending: Gastroenterology | Admitting: Gastroenterology

## 2016-12-17 DIAGNOSIS — K5090 Crohn's disease, unspecified, without complications: Secondary | ICD-10-CM | POA: Insufficient documentation

## 2016-12-17 DIAGNOSIS — Z5181 Encounter for therapeutic drug level monitoring: Secondary | ICD-10-CM | POA: Diagnosis not present

## 2016-12-17 MED ORDER — ACETAMINOPHEN 500 MG PO TABS
1000.0000 mg | ORAL_TABLET | ORAL | Status: AC
  Administered 2016-12-17: 1000 mg via ORAL
  Filled 2016-12-17: qty 2

## 2016-12-17 MED ORDER — SODIUM CHLORIDE 0.9 % IV SOLN
400.0000 mg | INTRAVENOUS | Status: AC
  Administered 2016-12-17: 400 mg via INTRAVENOUS
  Filled 2016-12-17: qty 40

## 2016-12-17 MED ORDER — METHYLPREDNISOLONE SODIUM SUCC 40 MG IJ SOLR
20.0000 mg | INTRAMUSCULAR | Status: AC
  Administered 2016-12-17: 20 mg via INTRAVENOUS
  Filled 2016-12-17: qty 1

## 2016-12-17 MED ORDER — SODIUM CHLORIDE 0.9 % IV SOLN
INTRAVENOUS | Status: AC
  Administered 2016-12-17: 09:00:00 via INTRAVENOUS

## 2017-02-11 ENCOUNTER — Encounter (HOSPITAL_COMMUNITY): Payer: Self-pay

## 2017-02-11 ENCOUNTER — Encounter (HOSPITAL_COMMUNITY)
Admission: RE | Admit: 2017-02-11 | Discharge: 2017-02-11 | Disposition: A | Discharge status: Home / Self Care | Payer: Medicare Other | Source: Ambulatory Visit | Attending: Gastroenterology | Admitting: Gastroenterology

## 2017-02-11 DIAGNOSIS — K5090 Crohn's disease, unspecified, without complications: Secondary | ICD-10-CM | POA: Insufficient documentation

## 2017-02-11 DIAGNOSIS — Z5181 Encounter for therapeutic drug level monitoring: Secondary | ICD-10-CM | POA: Diagnosis not present

## 2017-02-11 MED ORDER — SODIUM CHLORIDE 0.9 % IV SOLN
400.0000 mg | INTRAVENOUS | Status: DC
  Administered 2017-02-11: 400 mg via INTRAVENOUS
  Filled 2017-02-11: qty 40

## 2017-02-11 MED ORDER — ACETAMINOPHEN 500 MG PO TABS
1000.0000 mg | ORAL_TABLET | ORAL | Status: DC
  Administered 2017-02-11: 1000 mg via ORAL
  Filled 2017-02-11: qty 2

## 2017-02-11 MED ORDER — SODIUM CHLORIDE 0.9 % IV SOLN
INTRAVENOUS | Status: DC
  Administered 2017-02-11: 09:00:00 via INTRAVENOUS

## 2017-02-11 MED ORDER — METHYLPREDNISOLONE SODIUM SUCC 40 MG IJ SOLR
20.0000 mg | INTRAMUSCULAR | Status: DC
  Administered 2017-02-11: 20 mg via INTRAVENOUS
  Filled 2017-02-11: qty 1

## 2017-02-11 NOTE — Discharge Instructions (Signed)
Remicade Infliximab injection What is this medicine? INFLIXIMAB (in [place] i mab) is used to treat Crohn's disease and ulcerative colitis. It is also used to treat ankylosing spondylitis, plaque psoriasis, and some forms of arthritis. This medicine may be used for other purposes; ask your health care provider or pharmacist if you have questions. COMMON BRAND NAME(S): INFLECTRA, Remicade, RENFLEXIS What should I tell my health care provider before I take this medicine? They need to know if you have any of these conditions: -cancer -current or past resident of [place] or [place] -diabetes -exposure to tuberculosis -Guillain-Barre syndrome -heart failure -hepatitis or liver disease -immune system problems -infection -lung or breathing disease, like COPD -multiple sclerosis -receiving phototherapy for the skin -seizure disorder -an unusual or allergic reaction to infliximab, mouse proteins, other medicines, foods, dyes, or preservatives -pregnant or trying to get pregnant -breast-feeding How should I use this medicine? This medicine is for injection into a vein. It is usually given by a health care professional in a hospital or clinic setting. A special MedGuide will be given to you by the pharmacist with each prescription and refill. Be sure to read this information carefully each time. Talk to your pediatrician regarding the use of this medicine in children. While this drug may be prescribed for children as young as 32 years of age for selected conditions, precautions do apply. Overdosage: If you think you have taken too much of this medicine contact a poison control center or emergency room at once. NOTE: This medicine is only for you. Do not share this medicine with others. What if I miss a dose? It is important not to miss your dose. Call your doctor or health care professional if you are unable to keep an appointment. What may interact with this medicine? Do not take  this medicine with any of the following medications: -biologic medicines such as abatacept, adalimumab, anakinra, certolizumab, etanercept, golimumab, rituximab, secukinumab, tocilizumab, tofactinib, ustekinumab -live vaccines This list may not describe all possible interactions. Give your health care provider a list of all the medicines, herbs, non-prescription drugs, or dietary supplements you use. Also tell them if you smoke, drink alcohol, or use illegal drugs. Some items may interact with your medicine. What should I watch for while using this medicine? Your condition will be monitored carefully while you are receiving this medicine. Visit your doctor or health care professional for regular checks on your progress. You may need blood work done while you are taking this medicine. Before beginning therapy, your doctor may do a test to see if you have been exposed to tuberculosis. Call your doctor or health care professional for advice if you get a fever, chills or sore throat, or other symptoms of a cold or flu. Do not treat yourself. This drug decreases your body's ability to fight infections. Try to avoid being around people who are sick. This medicine may make the symptoms of heart failure worse in some patients. If you notice symptoms such as increased shortness of breath or swelling of the ankles or legs, contact your health care provider right away. If you are going to have surgery or dental work, tell your health care professional or dentist that you have received this medicine. If you take this medicine for plaque psoriasis, stay out of the sun. If you cannot avoid being in the sun, wear protective clothing and use sunscreen. Do not use sun lamps or tanning beds/booths. Talk to your doctor about your risk of cancer. You may  be more at risk for certain types of cancers if you take this medicine. What side effects may I notice from receiving this medicine? Side effects that you should report to  your doctor or health care professional as soon as possible: -allergic reactions like skin rash, itching or hives, swelling of the face, lips, or tongue -breathing problems -changes in vision -chest pain -fever or chills, usually related to the infusion -joint pain -pain, tingling, numbness in the hands or feet -redness, blistering, peeling or loosening of the skin, including inside the mouth -seizures -signs of infection - fever or chills, cough, sore throat, flu-like symptoms, pain or difficulty passing urine -signs and symptoms of liver injury like dark yellow or brown urine; general ill feeling; light-colored stools; loss of appetite; nausea; right upper belly pain; unusually weak or tired; yellowing of the eyes or skin -signs and symptoms of a stroke like changes in vision; confusion; trouble speaking or understanding; severe headaches; sudden numbness or weakness of the face, arm or leg; trouble walking; dizziness; loss of balance or coordination -swelling of the ankles, feet, or hands -swollen lymph nodes in the neck, underarm, or groin areas -unusual bleeding or bruising -unusually weak or tired Side effects that usually do not require medical attention (report to your doctor or health care professional if they continue or are bothersome): -headache -nausea -stomach pain -upset stomach This list may not describe all possible side effects. Call your doctor for medical advice about side effects. You may report side effects to FDA at 1-800-FDA-1088. Where should I keep my medicine? This drug is given in a hospital or clinic and will not be stored at home. NOTE: This sheet is a summary. It may not cover all possible information. If you have questions about this medicine, talk to your doctor, pharmacist, or health care provider.  2018 Elsevier/Gold Standard (2016-12-09 13:45:32)

## 2017-02-15 ENCOUNTER — Ambulatory Visit (INDEPENDENT_AMBULATORY_CARE_PROVIDER_SITE_OTHER): Payer: Medicare Other | Admitting: Pulmonary Disease

## 2017-02-15 ENCOUNTER — Encounter: Payer: Self-pay | Admitting: Pulmonary Disease

## 2017-02-15 VITALS — BP 132/86 | HR 72 | Ht 64.0 in | Wt 202.0 lb

## 2017-02-15 DIAGNOSIS — J4540 Moderate persistent asthma, uncomplicated: Secondary | ICD-10-CM | POA: Diagnosis not present

## 2017-02-15 NOTE — Patient Instructions (Signed)
Continue using your inhalers as prescribed  Return to clinic in 6 months.

## 2017-02-15 NOTE — Progress Notes (Signed)
[name]    [id number]    October 28, 1951  Primary Care Physician:GATES,ROBERT NEVILL, MD  Referring Physician: Josetta Huddle, MD [address] [place], [place] [address]  Chief complaint:  Follow-up for moderate persistent asthma.  HPI: Mrs. Deblois is a 66 year old with moderate persistent asthma, sleep apnea. She is a former patient of Dr. Joya Gaskins. She is feeling well with no exacerbations, ER visits or hospitalizations. Denies any dyspnea, wheezing. She is maintained on Advair 100/50. She does not have a rescue inhaler even though it has been prescribed.  Interim History: She had a viral infection in december with a lingering cough for 2 weeks that extended into january. Her asthma appears well controlled she continues on the advair and does not need to use her rescue inhaler.  Outpatient Encounter Prescriptions as of 02/15/2017  Medication Sig  . acetaminophen (TYLENOL ARTHRITIS PAIN) 650 MG CR tablet Take 1,300 mg by mouth every 8 (eight) hours as needed for pain.  [name] albuterol (PROAIR HFA) 108 (90 BASE) MCG/ACT inhaler Inhale 2 puffs into the lungs every 6 (six) hours as needed for wheezing or shortness of breath.  [name] aspirin EC 81 MG tablet Take 81 mg by mouth daily.  . Canagliflozin (INVOKANA) 300 MG TABS Take 300 mg by mouth daily.  . Cholecalciferol 1000 units TBDP Take 1,000 Units by mouth daily.  [name] estradiol (ESTRACE) 1 MG tablet Take 1 mg by mouth daily.  . fluconazole (DIFLUCAN) 100 MG tablet Take 100 mg by mouth every 7 (seven) days.  . fluticasone (FLONASE) 50 MCG/ACT nasal spray Place 2 sprays into both nostrils daily.  . Fluticasone-Salmeterol (ADVAIR DISKUS) 100-50 MCG/DOSE AEPB inhale 1 dose by mouth twice a day  . InFLIXimab (REMICADE IV) Inject into the vein every 8 (eight) weeks. For Crohn's  . losartan-hydrochlorothiazide (HYZAAR) 100-12.5 MG tablet Take 1 tablet by mouth daily.  . montelukast (SINGULAIR) 10 MG tablet TAKE 1 TABLET BY  MOUTH AT  BEDTIME  . Multiple Vitamin (MULTIVITAMIN WITH MINERALS) TABS Take 1 tablet by mouth daily.  [name] omeprazole (PRILOSEC) 20 MG capsule TAKE 1 CAPSULE BY MOUTH  DAILY  . OVER THE COUNTER MEDICATION Take 2 tablets by mouth daily as needed. Sinus Medication.  . pregabalin (LYRICA) 50 MG capsule Take 50 mg by mouth 2 (two) times daily.  . rosuvastatin (CRESTOR) 20 MG tablet Take 20 mg by mouth daily.  . sertraline (ZOLOFT) 100 MG tablet Take 100 mg by mouth daily.     No facility-administered encounter medications on file as of 02/15/2017.     Allergies as of 02/15/2017  . (No Known Allergies)    Past Medical History:  Diagnosis Date  . Allergic rhinitis   . Asthma   . Bilateral sciatica    nothing recently 4/`/15  . Chronic anemia   . Crohn's disease ([place])   . HTN (hypertension)   . Hypertension   . Sleep apnea    pt denies this  . 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 Type 2 diabetes mellitus ([place])     Past Surgical History:  Procedure Laterality Date  . ABDOMINAL HYSTERECTOMY  1988  . VESICOVAGINAL FISTULA CLOSURE W/ TAH  May 1988    Family History  Problem Relation Age of Onset  . Diabetes Mother   . Liver cancer Mother   . Heart attack Father     Social  History   Social History  . Marital status: Married    Spouse name: N/A  . Number of children: 2  . Years of education: N/A   Occupational History  . disabled    Social History Main Topics  . Smoking status: Never Smoker  . Smokeless tobacco: Never Used  . Alcohol use No  . Drug use: Unknown  . Sexual activity: Not on file   Other Topics Concern  . Not on file   Social History Narrative  . No narrative on file     Review of systems: Review of Systems  Constitutional: Negative for fever and chills.  HENT: Negative.   Eyes: Negative for blurred vision.  Respiratory: as per HPI  Cardiovascular: Negative for chest pain and palpitations.    Gastrointestinal: Negative for vomiting, diarrhea, blood per rectum. Genitourinary: Negative for dysuria, urgency, frequency and hematuria.  Musculoskeletal: Negative for myalgias, back pain and joint pain.  Skin: Negative for itching and rash.  Neurological: Negative for dizziness, tremors, focal weakness, seizures and loss of consciousness.  Endo/Heme/Allergies: Negative for environmental allergies.  Psychiatric/Behavioral: Negative for depression, suicidal ideas and hallucinations.  All other systems reviewed and are negative.   Physical Exam: Blood pressure 132/86, pulse 72, height 5' 4"  (1.626 m), weight 202 lb (91.6 kg), SpO2 96 %. Gen:      No acute distress HEENT:  EOMI, sclera anicteric Neck:     No masses; no thyromegaly Lungs:    Clear to auscultation bilaterally; normal respiratory effort CV:         Regular rate and rhythm; no murmurs Abd:      + bowel sounds; soft, non-tender; no palpable masses, no distension Ext:    No edema; adequate peripheral perfusion Skin:      Warm and dry; no rash Neuro: alert and oriented x 3 Psych: normal mood and affect  Data Reviewed:  Assessment:  #1. Moderate persistent asthma. She has been stable clinically. Continue to use the inhalers, Singulair and Flonase Spirometry, FENO at next visit  #2. Sleep apnea Not on CPAP but she feels asymptomatic. She is not interested in trying again.  Plan/Recommendations: - Continue Advair, albuterol rescue inhaler.  Marshell Garfinkel MD [facility] Pager [phone] 02/15/2017, 11:00 AM  CC: Josetta Huddle, MD

## 2017-02-23 ENCOUNTER — Other Ambulatory Visit: Payer: Self-pay | Admitting: Pulmonary Disease

## 2017-04-08 ENCOUNTER — Encounter (HOSPITAL_COMMUNITY)
Admission: RE | Admit: 2017-04-08 | Discharge: 2017-04-08 | Disposition: A | Discharge status: Home / Self Care | Payer: Medicare Other | Source: Ambulatory Visit | Attending: Gastroenterology | Admitting: Gastroenterology

## 2017-04-08 ENCOUNTER — Encounter (HOSPITAL_COMMUNITY): Payer: Self-pay

## 2017-04-08 DIAGNOSIS — Z5181 Encounter for therapeutic drug level monitoring: Secondary | ICD-10-CM | POA: Diagnosis not present

## 2017-04-08 DIAGNOSIS — K5090 Crohn's disease, unspecified, without complications: Secondary | ICD-10-CM | POA: Diagnosis not present

## 2017-04-08 MED ORDER — SODIUM CHLORIDE 0.9 % IV SOLN
INTRAVENOUS | Status: AC
  Administered 2017-04-08: 08:00:00 via INTRAVENOUS

## 2017-04-08 MED ORDER — ACETAMINOPHEN 500 MG PO TABS
1000.0000 mg | ORAL_TABLET | ORAL | Status: AC
  Administered 2017-04-08: 1000 mg via ORAL
  Filled 2017-04-08: qty 2

## 2017-04-08 MED ORDER — METHYLPREDNISOLONE SODIUM SUCC 40 MG IJ SOLR
20.0000 mg | INTRAMUSCULAR | Status: AC
  Administered 2017-04-08: 20 mg via INTRAVENOUS
  Filled 2017-04-08: qty 1

## 2017-04-08 MED ORDER — SODIUM CHLORIDE 0.9 % IV SOLN
400.0000 mg | INTRAVENOUS | Status: AC
  Administered 2017-04-08: 400 mg via INTRAVENOUS
  Filled 2017-04-08: qty 40

## 2017-06-03 ENCOUNTER — Encounter (HOSPITAL_COMMUNITY)
Admission: RE | Admit: 2017-06-03 | Discharge: 2017-06-03 | Disposition: A | Discharge status: Home / Self Care | Payer: Medicare Other | Source: Ambulatory Visit | Attending: Gastroenterology | Admitting: Gastroenterology

## 2017-06-03 ENCOUNTER — Encounter (HOSPITAL_COMMUNITY): Payer: Self-pay

## 2017-06-03 DIAGNOSIS — Z5181 Encounter for therapeutic drug level monitoring: Secondary | ICD-10-CM | POA: Diagnosis not present

## 2017-06-03 DIAGNOSIS — K5090 Crohn's disease, unspecified, without complications: Secondary | ICD-10-CM | POA: Diagnosis present

## 2017-06-03 MED ORDER — METHYLPREDNISOLONE SODIUM SUCC 40 MG IJ SOLR
20.0000 mg | INTRAMUSCULAR | Status: DC
  Administered 2017-06-03: 20 mg via INTRAVENOUS
  Filled 2017-06-03: qty 1

## 2017-06-03 MED ORDER — ACETAMINOPHEN 325 MG PO TABS
650.0000 mg | ORAL_TABLET | ORAL | Status: DC
  Administered 2017-06-03: 650 mg via ORAL
  Filled 2017-06-03: qty 2

## 2017-06-03 MED ORDER — SODIUM CHLORIDE 0.9 % IV SOLN
400.0000 mg | INTRAVENOUS | Status: DC
  Administered 2017-06-03: 400 mg via INTRAVENOUS
  Filled 2017-06-03: qty 40

## 2017-06-03 MED ORDER — SODIUM CHLORIDE 0.9 % IV SOLN
INTRAVENOUS | Status: DC
  Administered 2017-06-03: 08:00:00 via INTRAVENOUS

## 2017-08-02 ENCOUNTER — Encounter (HOSPITAL_COMMUNITY): Payer: Self-pay

## 2017-08-02 ENCOUNTER — Encounter (HOSPITAL_COMMUNITY)
Admission: RE | Admit: 2017-08-02 | Discharge: 2017-08-02 | Disposition: A | Discharge status: Home / Self Care | Payer: Medicare Other | Source: Ambulatory Visit | Attending: Gastroenterology | Admitting: Gastroenterology

## 2017-08-02 DIAGNOSIS — K5090 Crohn's disease, unspecified, without complications: Secondary | ICD-10-CM | POA: Diagnosis not present

## 2017-08-02 DIAGNOSIS — Z5181 Encounter for therapeutic drug level monitoring: Secondary | ICD-10-CM | POA: Diagnosis not present

## 2017-08-02 MED ORDER — SODIUM CHLORIDE 0.9 % IV SOLN
400.0000 mg | INTRAVENOUS | Status: DC
  Administered 2017-08-02: 400 mg via INTRAVENOUS
  Filled 2017-08-02: qty 40

## 2017-08-02 MED ORDER — METHYLPREDNISOLONE SODIUM SUCC 40 MG IJ SOLR
20.0000 mg | INTRAMUSCULAR | Status: DC
  Administered 2017-08-02: 20 mg via INTRAVENOUS
  Filled 2017-08-02: qty 1

## 2017-08-02 MED ORDER — ACETAMINOPHEN 325 MG PO TABS
650.0000 mg | ORAL_TABLET | ORAL | Status: DC
  Administered 2017-08-02: 650 mg via ORAL
  Filled 2017-08-02: qty 2

## 2017-08-02 MED ORDER — SODIUM CHLORIDE 0.9 % IV SOLN
INTRAVENOUS | Status: DC
  Administered 2017-08-02: 10:00:00 via INTRAVENOUS

## 2017-08-02 NOTE — Discharge Instructions (Signed)
Infliximab injection (Remicade infusion ) What is this medicine? INFLIXIMAB (in [place] i mab) is used to treat Crohn's disease and ulcerative colitis. It is also used to treat ankylosing spondylitis, plaque psoriasis, and some forms of arthritis. This medicine may be used for other purposes; ask your health care provider or pharmacist if you have questions. COMMON BRAND NAME(S): INFLECTRA, Remicade, RENFLEXIS What should I tell my health care provider before I take this medicine? They need to know if you have any of these conditions: -cancer -current or past resident of [place] or [place] -diabetes -exposure to tuberculosis -Guillain-Barre syndrome -heart failure -hepatitis or liver disease -immune system problems -infection -lung or breathing disease, like COPD -multiple sclerosis -receiving phototherapy for the skin -seizure disorder -an unusual or allergic reaction to infliximab, mouse proteins, other medicines, foods, dyes, or preservatives -pregnant or trying to get pregnant -breast-feeding How should I use this medicine? This medicine is for injection into a vein. It is usually given by a health care professional in a hospital or clinic setting. A special MedGuide will be given to you by the pharmacist with each prescription and refill. Be sure to read this information carefully each time. Talk to your pediatrician regarding the use of this medicine in children. While this drug may be prescribed for children as young as 70 years of age for selected conditions, precautions do apply. Overdosage: If you think you have taken too much of this medicine contact a poison control center or emergency room at once. NOTE: This medicine is only for you. Do not share this medicine with others. What if I miss a dose? It is important not to miss your dose. Call your doctor or health care professional if you are unable to keep an appointment. What may interact with this medicine? Do  not take this medicine with any of the following medications: -biologic medicines such as abatacept, adalimumab, anakinra, certolizumab, etanercept, golimumab, rituximab, secukinumab, tocilizumab, tofactinib, ustekinumab -live vaccines This list may not describe all possible interactions. Give your health care provider a list of all the medicines, herbs, non-prescription drugs, or dietary supplements you use. Also tell them if you smoke, drink alcohol, or use illegal drugs. Some items may interact with your medicine. What should I watch for while using this medicine? Your condition will be monitored carefully while you are receiving this medicine. Visit your doctor or health care professional for regular checks on your progress. You may need blood work done while you are taking this medicine. Before beginning therapy, your doctor may do a test to see if you have been exposed to tuberculosis. Call your doctor or health care professional for advice if you get a fever, chills or sore throat, or other symptoms of a cold or flu. Do not treat yourself. This drug decreases your body's ability to fight infections. Try to avoid being around people who are sick. This medicine may make the symptoms of heart failure worse in some patients. If you notice symptoms such as increased shortness of breath or swelling of the ankles or legs, contact your health care provider right away. If you are going to have surgery or dental work, tell your health care professional or dentist that you have received this medicine. If you take this medicine for plaque psoriasis, stay out of the sun. If you cannot avoid being in the sun, wear protective clothing and use sunscreen. Do not use sun lamps or tanning beds/booths. Talk to your doctor about your risk of cancer.  You may be more at risk for certain types of cancers if you take this medicine. What side effects may I notice from receiving this medicine? Side effects that you should  report to your doctor or health care professional as soon as possible: -allergic reactions like skin rash, itching or hives, swelling of the face, lips, or tongue -breathing problems -changes in vision -chest pain -fever or chills, usually related to the infusion -joint pain -pain, tingling, numbness in the hands or feet -redness, blistering, peeling or loosening of the skin, including inside the mouth -seizures -signs of infection - fever or chills, cough, sore throat, flu-like symptoms, pain or difficulty passing urine -signs and symptoms of liver injury like dark yellow or brown urine; general ill feeling; light-colored stools; loss of appetite; nausea; right upper belly pain; unusually weak or tired; yellowing of the eyes or skin -signs and symptoms of a stroke like changes in vision; confusion; trouble speaking or understanding; severe headaches; sudden numbness or weakness of the face, arm or leg; trouble walking; dizziness; loss of balance or coordination -swelling of the ankles, feet, or hands -swollen lymph nodes in the neck, underarm, or groin areas -unusual bleeding or bruising -unusually weak or tired Side effects that usually do not require medical attention (report to your doctor or health care professional if they continue or are bothersome): -headache -nausea -stomach pain -upset stomach This list may not describe all possible side effects. Call your doctor for medical advice about side effects. You may report side effects to FDA at 1-800-FDA-1088. Where should I keep my medicine? This drug is given in a hospital or clinic and will not be stored at home. NOTE: This sheet is a summary. It may not cover all possible information. If you have questions about this medicine, talk to your doctor, pharmacist, or health care provider.  2018 Elsevier/Gold Standard (2016-12-09 13:45:32)

## 2017-08-10 ENCOUNTER — Other Ambulatory Visit: Payer: Self-pay

## 2017-08-10 MED ORDER — OMEPRAZOLE 20 MG PO CPDR: 20.0000 mg | DELAYED_RELEASE_CAPSULE | Freq: Every day | ORAL | 0 refills | Status: DC

## 2017-08-10 NOTE — Telephone Encounter (Signed)
Received refill request for Prilosec 54m from [facility]. Pt last seen 02/15/17 and instructed to continue current medications. Pt has pending apt for 08/23/17 Rx has been sent to preferred pharmacy. Nothing further needed.

## 2017-08-23 ENCOUNTER — Encounter: Payer: Self-pay | Admitting: Pulmonary Disease

## 2017-08-23 ENCOUNTER — Ambulatory Visit (INDEPENDENT_AMBULATORY_CARE_PROVIDER_SITE_OTHER): Payer: Medicare Other | Admitting: Pulmonary Disease

## 2017-08-23 VITALS — BP 126/84 | HR 74 | Ht 64.0 in | Wt 204.6 lb

## 2017-08-23 DIAGNOSIS — Z23 Encounter for immunization: Secondary | ICD-10-CM

## 2017-08-23 DIAGNOSIS — J4540 Moderate persistent asthma, uncomplicated: Secondary | ICD-10-CM | POA: Diagnosis not present

## 2017-08-23 LAB — NITRIC OXIDE: NITRIC OXIDE: 10

## 2017-08-23 NOTE — Patient Instructions (Signed)
We will get a full pulmonary function test and FENO to get a baseline assessment of lung function.  Continue using current inhalers  Return to clinic in 1 year. Please call us sooner if there is any worsening of symptoms.

## 2017-08-23 NOTE — Progress Notes (Addendum)
[name]    [id number]    01/26/1951  Primary Care Physician:Gates, Herbie [place], MD  Referring Physician: Josetta Huddle, MD [address] [place], [place] [address]  Chief complaint:  Follow-up for moderate persistent asthma.  HPI: [name] is a 66 year old with moderate persistent asthma, sleep apnea. She is a former patient of Dr. Joya [name]. She is feeling well with no exacerbations, ER visits or hospitalizations. Denies any dyspnea, wheezing. She is maintained on Advair 100/50. She does not have a rescue inhaler even though it has been prescribed.  Her history is significant for Crohn's disease for which she is being treated with Remicade.  She has history of lymphoma involving the lung.  This presented as right suprahilar lung mass status post VATS with right upper lobe and right middle lobe wedge resection in 2006.  Interim History: Reports sinus congestion with postnasal drip, nonproductive cough. Her breathing however is stable with no dyspnea, wheezing. She is using over-the-counter medication to deal with her sinus issues.  Outpatient Encounter Prescriptions as of 08/23/2017  Medication Sig  . acetaminophen (TYLENOL ARTHRITIS PAIN) 650 MG CR tablet Take 1,300 mg by mouth every 8 (eight) hours as needed for pain.  [name] albuterol (PROAIR HFA) 108 (90 BASE) MCG/ACT inhaler Inhale 2 puffs into the lungs every 6 (six) hours as needed for wheezing or shortness of breath.  [name] aspirin EC 81 MG tablet Take 81 mg by mouth daily.  . Canagliflozin (INVOKANA) 300 MG TABS Take 300 mg by mouth daily.  . Cholecalciferol 1000 units TBDP Take 1,000 Units by mouth daily.  [name] estradiol (ESTRACE) 1 MG tablet Take 1 mg by mouth daily.  . fluconazole (DIFLUCAN) 100 MG tablet Take 100 mg by mouth every 7 (seven) days.  . fluticasone (FLONASE) 50 MCG/ACT nasal spray Place 2 sprays into both nostrils daily.  . Fluticasone-Salmeterol (ADVAIR DISKUS) 100-50 MCG/DOSE AEPB inhale 1  dose by mouth twice a day  . InFLIXimab (REMICADE IV) Inject into the vein every 8 (eight) weeks. For Crohn's  . losartan-hydrochlorothiazide (HYZAAR) 100-12.5 MG tablet Take 1 tablet by mouth daily.  . montelukast (SINGULAIR) 10 MG tablet TAKE 1 TABLET BY MOUTH AT  BEDTIME  . Multiple Vitamin (MULTIVITAMIN WITH MINERALS) TABS Take 1 tablet by mouth daily.  [name] omeprazole (PRILOSEC) 20 MG capsule Take 1 capsule (20 mg total) by mouth daily.  [name] OVER THE COUNTER MEDICATION Take 2 tablets by mouth daily as needed. Sinus Medication.  . pregabalin (LYRICA) 50 MG capsule Take 50 mg by mouth 2 (two) times daily.  . sertraline (ZOLOFT) 100 MG tablet Take 100 mg by mouth daily.    . [DISCONTINUED] rosuvastatin (CRESTOR) 20 MG tablet Take 20 mg by mouth daily.   No facility-administered encounter medications on file as of 08/23/2017.     Allergies as of 08/23/2017  . (No Known Allergies)    Past Medical History:  Diagnosis Date  . Allergic rhinitis   . Asthma   . Bilateral sciatica    nothing recently 4/`/15  . Chronic anemia   . Crohn's disease ([place])   . HTN (hypertension)   . Hypertension   . Sleep apnea    pt denies this  . 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 Type 2 diabetes mellitus ([place])     Past Surgical History:  Procedure Laterality Date  . ABDOMINAL HYSTERECTOMY  1988  .  VESICOVAGINAL FISTULA CLOSURE W/ TAH  May 1988    Family History  Problem Relation Age of Onset  . Diabetes Mother   . Liver cancer Mother   . Heart attack Father     Social History   Social History  . Marital status: Married    Spouse name: N/A  . Number of children: 2  . Years of education: N/A   Occupational History  . disabled    Social History Main Topics  . Smoking status: Never Smoker  . Smokeless tobacco: Never Used  . Alcohol use No  . Drug use: Unknown  . Sexual activity: Not on file   Other Topics Concern  . Not on file   Social  History Narrative  . No narrative on file    Review of systems: Review of Systems  Constitutional: Negative for fever and chills.  HENT: Negative.   Eyes: Negative for blurred vision.  Respiratory: as per HPI  Cardiovascular: Negative for chest pain and palpitations.  Gastrointestinal: Negative for vomiting, diarrhea, blood per rectum. Genitourinary: Negative for dysuria, urgency, frequency and hematuria.  Musculoskeletal: Negative for myalgias, back pain and joint pain.  Skin: Negative for itching and rash.  Neurological: Negative for dizziness, tremors, focal weakness, seizures and loss of consciousness.  Endo/Heme/Allergies: Negative for environmental allergies.  Psychiatric/Behavioral: Negative for depression, suicidal ideas and hallucinations.  All other systems reviewed and are negative.  Physical Exam: Blood pressure 126/84, pulse 74, height 5' 4"  (1.626 m), weight 92.8 kg (204 lb 9.6 oz), SpO2 96 %. Gen:      No acute distress HEENT:  EOMI, sclera anicteric Neck:     No masses; no thyromegaly Lungs:    Clear to auscultation bilaterally; normal respiratory effort CV:         Regular rate and rhythm; no murmurs Abd:      + bowel sounds; soft, non-tender; no palpable masses, no distension Ext:    No edema; adequate peripheral perfusion Skin:      Warm and dry; no rash Neuro: alert and oriented x 3 Psych: normal mood and affect  Data Reviewed:  Assessment:  #1. Moderate persistent asthma. She has been stable clinically. Continue to use the inhalers, Singulair and Flonase Order PFTs and FENO Based on these results we may consider reducing her inhalers.  #2. Sleep apnea Not on CPAP. She feels that she does not have symptoms of sleep apnea and is not interested in trying therapy.  Plan/Recommendations: - Continue Advair, albuterol rescue inhaler. - Check FENO and PFTs  Ashley Garfinkel MD [facility] Pager [phone] 08/23/2017, 11:04  AM  CC: Ashley Huddle, MD

## 2017-08-26 ENCOUNTER — Ambulatory Visit (INDEPENDENT_AMBULATORY_CARE_PROVIDER_SITE_OTHER): Payer: Medicare Other | Admitting: Pulmonary Disease

## 2017-08-26 DIAGNOSIS — J4540 Moderate persistent asthma, uncomplicated: Secondary | ICD-10-CM

## 2017-08-26 LAB — PULMONARY FUNCTION TEST
DL/VA % pred: 155 %
DL/VA: 7.07 ml/min/mmHg/L
DLCO COR % PRED: 64 %
DLCO UNC % PRED: 65 %
DLCO UNC: 14.18 ml/min/mmHg
DLCO cor: 13.97 ml/min/mmHg
FEF 25-75 POST: 2.67 L/s
FEF 25-75 PRE: 1.51 L/s
FEF2575-%Change-Post: 76 %
FEF2575-%Pred-Post: 166 %
FEF2575-%Pred-Pre: 94 %
FEV1-%CHANGE-POST: 7 %
FEV1-%PRED-POST: 71 %
FEV1-%Pred-Pre: 65 %
FEV1-POST: 1.21 L
FEV1-PRE: 1.12 L
FEV1FVC-%Change-Post: 1 %
FEV1FVC-%PRED-PRE: 118 %
FEV6-%CHANGE-POST: 4 %
FEV6-%PRED-POST: 60 %
FEV6-%Pred-Pre: 57 %
FEV6-Post: 1.26 L
FEV6-Pre: 1.21 L
FEV6FVC-%CHANGE-POST: -1 %
FEV6FVC-%PRED-POST: 103 %
FEV6FVC-%Pred-Pre: 105 %
FVC-%CHANGE-POST: 6 %
FVC-%Pred-Post: 58 %
FVC-%Pred-Pre: 55 %
FVC-Post: 1.29 L
FVC-Pre: 1.21 L
PRE FEV6/FVC RATIO: 100 %
Post FEV1/FVC ratio: 94 %
Post FEV6/FVC ratio: 98 %
Pre FEV1/FVC ratio: 92 %
RV % PRED: 85 %
RV: 1.76 L
TLC % PRED: 62 %
TLC: 2.97 L

## 2017-08-26 NOTE — Progress Notes (Signed)
PFT done today. 

## 2017-09-10 ENCOUNTER — Other Ambulatory Visit: Payer: Self-pay

## 2017-09-10 DIAGNOSIS — R942 Abnormal results of pulmonary function studies: Secondary | ICD-10-CM

## 2017-09-16 ENCOUNTER — Ambulatory Visit (INDEPENDENT_AMBULATORY_CARE_PROVIDER_SITE_OTHER)
Admission: RE | Admit: 2017-09-16 | Discharge: 2017-09-16 | Disposition: A | Discharge status: Home / Self Care | Payer: Medicare Other | Source: Ambulatory Visit | Attending: Pulmonary Disease | Admitting: Pulmonary Disease

## 2017-09-16 DIAGNOSIS — R942 Abnormal results of pulmonary function studies: Secondary | ICD-10-CM | POA: Diagnosis not present

## 2017-09-21 ENCOUNTER — Other Ambulatory Visit: Payer: Self-pay | Admitting: Pulmonary Disease

## 2017-09-23 ENCOUNTER — Other Ambulatory Visit: Payer: Self-pay | Admitting: Pulmonary Disease

## 2017-09-27 ENCOUNTER — Encounter (HOSPITAL_COMMUNITY)
Admission: RE | Admit: 2017-09-27 | Discharge: 2017-09-27 | Disposition: A | Discharge status: Home / Self Care | Payer: Medicare Other | Source: Ambulatory Visit | Attending: Gastroenterology | Admitting: Gastroenterology

## 2017-09-27 ENCOUNTER — Encounter (HOSPITAL_COMMUNITY): Payer: Self-pay

## 2017-09-27 DIAGNOSIS — Z5181 Encounter for therapeutic drug level monitoring: Secondary | ICD-10-CM | POA: Insufficient documentation

## 2017-09-27 DIAGNOSIS — K5090 Crohn's disease, unspecified, without complications: Secondary | ICD-10-CM | POA: Diagnosis not present

## 2017-09-27 MED ORDER — ACETAMINOPHEN 325 MG PO TABS
650.0000 mg | ORAL_TABLET | ORAL | Status: DC
  Administered 2017-09-27: 650 mg via ORAL
  Filled 2017-09-27: qty 2

## 2017-09-27 MED ORDER — METHYLPREDNISOLONE SODIUM SUCC 40 MG IJ SOLR
20.0000 mg | INTRAMUSCULAR | Status: DC
  Administered 2017-09-27: 20 mg via INTRAVENOUS
  Filled 2017-09-27: qty 1

## 2017-09-27 MED ORDER — SODIUM CHLORIDE 0.9 % IV SOLN
400.0000 mg | INTRAVENOUS | Status: DC
  Administered 2017-09-27: 400 mg via INTRAVENOUS
  Filled 2017-09-27: qty 40

## 2017-09-27 MED ORDER — SODIUM CHLORIDE 0.9 % IV SOLN
INTRAVENOUS | Status: DC
  Administered 2017-09-27: 10:00:00 via INTRAVENOUS

## 2017-09-27 NOTE — Discharge Instructions (Signed)
Remicade Infliximab injection What is this medicine? INFLIXIMAB (in [place] i mab) is used to treat Crohn's disease and ulcerative colitis. It is also used to treat ankylosing spondylitis, plaque psoriasis, and some forms of arthritis. This medicine may be used for other purposes; ask your health care provider or pharmacist if you have questions. COMMON BRAND NAME(S): INFLECTRA, Remicade, RENFLEXIS What should I tell my health care provider before I take this medicine? They need to know if you have any of these conditions: -cancer -current or past resident of [place] or [place] -diabetes -exposure to tuberculosis -Guillain-Barre syndrome -heart failure -hepatitis or liver disease -immune system problems -infection -lung or breathing disease, like COPD -multiple sclerosis -receiving phototherapy for the skin -seizure disorder -an unusual or allergic reaction to infliximab, mouse proteins, other medicines, foods, dyes, or preservatives -pregnant or trying to get pregnant -breast-feeding How should I use this medicine? This medicine is for injection into a vein. It is usually given by a health care professional in a hospital or clinic setting. A special MedGuide will be given to you by the pharmacist with each prescription and refill. Be sure to read this information carefully each time. Talk to your pediatrician regarding the use of this medicine in children. While this drug may be prescribed for children as young as 77 years of age for selected conditions, precautions do apply. Overdosage: If you think you have taken too much of this medicine contact a poison control center or emergency room at once. NOTE: This medicine is only for you. Do not share this medicine with others. What if I miss a dose? It is important not to miss your dose. Call your doctor or health care professional if you are unable to keep an appointment. What may interact with this medicine? Do not take  this medicine with any of the following medications: -biologic medicines such as abatacept, adalimumab, anakinra, certolizumab, etanercept, golimumab, rituximab, secukinumab, tocilizumab, tofactinib, ustekinumab -live vaccines This list may not describe all possible interactions. Give your health care provider a list of all the medicines, herbs, non-prescription drugs, or dietary supplements you use. Also tell them if you smoke, drink alcohol, or use illegal drugs. Some items may interact with your medicine. What should I watch for while using this medicine? Your condition will be monitored carefully while you are receiving this medicine. Visit your doctor or health care professional for regular checks on your progress. You may need blood work done while you are taking this medicine. Before beginning therapy, your doctor may do a test to see if you have been exposed to tuberculosis. Call your doctor or health care professional for advice if you get a fever, chills or sore throat, or other symptoms of a cold or flu. Do not treat yourself. This drug decreases your body's ability to fight infections. Try to avoid being around people who are sick. This medicine may make the symptoms of heart failure worse in some patients. If you notice symptoms such as increased shortness of breath or swelling of the ankles or legs, contact your health care provider right away. If you are going to have surgery or dental work, tell your health care professional or dentist that you have received this medicine. If you take this medicine for plaque psoriasis, stay out of the sun. If you cannot avoid being in the sun, wear protective clothing and use sunscreen. Do not use sun lamps or tanning beds/booths. Talk to your doctor about your risk of cancer. You may  be more at risk for certain types of cancers if you take this medicine. What side effects may I notice from receiving this medicine? Side effects that you should report to  your doctor or health care professional as soon as possible: -allergic reactions like skin rash, itching or hives, swelling of the face, lips, or tongue -breathing problems -changes in vision -chest pain -fever or chills, usually related to the infusion -joint pain -pain, tingling, numbness in the hands or feet -redness, blistering, peeling or loosening of the skin, including inside the mouth -seizures -signs of infection - fever or chills, cough, sore throat, flu-like symptoms, pain or difficulty passing urine -signs and symptoms of liver injury like dark yellow or brown urine; general ill feeling; light-colored stools; loss of appetite; nausea; right upper belly pain; unusually weak or tired; yellowing of the eyes or skin -signs and symptoms of a stroke like changes in vision; confusion; trouble speaking or understanding; severe headaches; sudden numbness or weakness of the face, arm or leg; trouble walking; dizziness; loss of balance or coordination -swelling of the ankles, feet, or hands -swollen lymph nodes in the neck, underarm, or groin areas -unusual bleeding or bruising -unusually weak or tired Side effects that usually do not require medical attention (report to your doctor or health care professional if they continue or are bothersome): -headache -nausea -stomach pain -upset stomach This list may not describe all possible side effects. Call your doctor for medical advice about side effects. You may report side effects to FDA at 1-800-FDA-1088. Where should I keep my medicine? This drug is given in a hospital or clinic and will not be stored at home. NOTE: This sheet is a summary. It may not cover all possible information. If you have questions about this medicine, talk to your doctor, pharmacist, or health care provider.  2018 Elsevier/Gold Standard (2016-12-09 13:45:32)

## 2017-09-28 ENCOUNTER — Other Ambulatory Visit: Payer: Self-pay | Admitting: Pulmonary Disease

## 2017-10-19 ENCOUNTER — Other Ambulatory Visit: Payer: Self-pay | Admitting: Obstetrics & Gynecology

## 2017-10-19 DIAGNOSIS — Z1231 Encounter for screening mammogram for malignant neoplasm of breast: Secondary | ICD-10-CM

## 2017-10-28 ENCOUNTER — Ambulatory Visit (HOSPITAL_BASED_OUTPATIENT_CLINIC_OR_DEPARTMENT_OTHER): Payer: Medicare Other | Admitting: Oncology

## 2017-10-28 ENCOUNTER — Other Ambulatory Visit: Payer: Self-pay

## 2017-10-28 ENCOUNTER — Encounter: Payer: Self-pay | Admitting: Oncology

## 2017-10-28 ENCOUNTER — Telehealth: Payer: Self-pay | Admitting: Oncology

## 2017-10-28 ENCOUNTER — Other Ambulatory Visit (HOSPITAL_BASED_OUTPATIENT_CLINIC_OR_DEPARTMENT_OTHER): Payer: Medicare Other

## 2017-10-28 VITALS — BP 141/67 | HR 67 | Temp 97.8°F | Resp 18 | Ht 64.0 in | Wt 209.8 lb

## 2017-10-28 DIAGNOSIS — Z8572 Personal history of non-Hodgkin lymphomas: Secondary | ICD-10-CM

## 2017-10-28 DIAGNOSIS — C8519 Unspecified B-cell lymphoma, extranodal and solid organ sites: Secondary | ICD-10-CM

## 2017-10-28 DIAGNOSIS — K5090 Crohn's disease, unspecified, without complications: Secondary | ICD-10-CM | POA: Diagnosis not present

## 2017-10-28 LAB — CBC WITH DIFFERENTIAL/PLATELET
BASO%: 0.7 % (ref 0.0–2.0)
BASOS ABS: 0 10*3/uL (ref 0.0–0.1)
EOS%: 5.1 % (ref 0.0–7.0)
Eosinophils Absolute: 0.3 10*3/uL (ref 0.0–0.5)
HEMATOCRIT: 40.3 % (ref 34.8–46.6)
HEMOGLOBIN: 13.5 g/dL (ref 11.6–15.9)
LYMPH%: 39.5 % (ref 14.0–49.7)
MCH: 33.2 pg (ref 25.1–34.0)
MCHC: 33.5 g/dL (ref 31.5–36.0)
MCV: 99 fL (ref 79.5–101.0)
MONO#: 0.6 10*3/uL (ref 0.1–0.9)
MONO%: 9 % (ref 0.0–14.0)
NEUT%: 45.7 % (ref 38.4–76.8)
NEUTROS ABS: 2.8 10*3/uL (ref 1.5–6.5)
PLATELETS: 262 10*3/uL (ref 145–400)
RBC: 4.07 10*6/uL (ref 3.70–5.45)
RDW: 13.2 % (ref 11.2–14.5)
WBC: 6.1 10*3/uL (ref 3.9–10.3)
lymph#: 2.4 10*3/uL (ref 0.9–3.3)

## 2017-10-28 LAB — BASIC METABOLIC PANEL
Anion Gap: 10 mEq/L (ref 3–11)
BUN: 10.4 mg/dL (ref 7.0–26.0)
CHLORIDE: 103 meq/L (ref 98–109)
CO2: 26 meq/L (ref 22–29)
CREATININE: 0.8 mg/dL (ref 0.6–1.1)
Calcium: 9 mg/dL (ref 8.4–10.4)
EGFR: >60 mL/min/{1.73_m2} (ref >60)
GLUCOSE: 150 mg/dL — AB (ref 70–140)
Potassium: 3.7 mEq/L (ref 3.5–5.1)
SODIUM: 139 meq/L (ref 136–145)

## 2017-10-28 NOTE — Progress Notes (Signed)
  [facility] OFFICE PROGRESS NOTE   Diagnosis: MALT lymphoma, serum monoclonal protein  INTERVAL HISTORY:   [name] returns as scheduled.  She feels well.  Good appetite.  No bleeding.  She continues Remicade for treatment of inflammatory bowel disease.  She is now followed by Dr. Michail Sermon.  She received an influenza vaccine.  Objective:  Vital signs in last 24 hours:  Blood pressure (!) 141/67, pulse 67, temperature 97.8 F (36.6 C), temperature source Oral, resp. rate 18, height 5' 4"  (1.626 m), weight 209 lb 12.8 oz (95.2 kg), SpO2 95 %.    HEENT: Neck without mass Lymphatics: No cervical, supraclavicular, axillary, or inguinal nodes Resp: Lungs with end inspiratory rhonchi at the left posterior base, no respiratory distress Cardio: Regular rate and rhythm GI: No hepatosplenomegaly, nontender Vascular: No leg edema  Skin: Keloid formation at the right thoracotomy scar, no evidence of recurrent tumor    Lab Results:  Lab Results  Component Value Date   WBC 6.1 10/28/2017   HGB 13.5 10/28/2017   HCT 40.3 10/28/2017   MCV 99.0 10/28/2017   PLT 262 10/28/2017   NEUTROABS 2.8 10/28/2017   Serum M spike on 10/29/2016-0.5  Imaging: Chest x-ray 09/16/2017-acute abnormality Medications: I have reviewed the patient's current medications.  Assessment/Plan: 1. Non-Hodgkin lymphoma, low grade lymphoma involving the lungs, diagnosed in June 2006. No clinical evidence of progression. 2. Crohn's disease, followed by Dr. Wynetta Emery.She continues Remicade 3. Post thoracotomy pain, persistent, initially improved with Elavil 4. Anemia secondary to chronic disease and gastrointestinal bleeding. Hemoglobin is improved. 5. History of thrombocytosis. 6. History of anorexia/weight loss in the setting of active Crohn disease. Improved. 7. History of a serum monoclonal protein, stable on repeat serum protein electrophoresis studies, last in  December 2017 8. History of  asthma. .    Disposition:  Ms. Wisby appears stable.  No evidence for progression of the low-grade lymphoma.  We will follow-up on the serum protein electrophoresis from today.  Ms. Kasparian will return for an office visit in 1 year.  15 minutes were spent with the patient today.  The majority of the time was used for counseling and coordination of care.  [name], MD  10/28/2017  11:53 AM

## 2017-10-28 NOTE — Telephone Encounter (Signed)
Scheduled appt per 12/6 los - Gave patient AVS and calender per los. - lab and f/u in one year.

## 2017-11-01 LAB — PROTEIN ELECTROPHORESIS, SERUM
A/G Ratio: 1 (ref 0.7–1.7)
ALPHA 1: 0.2 g/dL (ref 0.0–0.4)
ALPHA 2: 0.7 g/dL (ref 0.4–1.0)
Albumin: 3.6 g/dL (ref 2.9–4.4)
Beta: 1.2 g/dL (ref 0.7–1.3)
Gamma Globulin: 1.4 g/dL (ref 0.4–1.8)
Globulin, Total: 3.5 g/dL (ref 2.2–3.9)
M-SPIKE, %: 0.5 g/dL — AB
TOTAL PROTEIN: 7.1 g/dL (ref 6.0–8.5)

## 2017-11-17 ENCOUNTER — Ambulatory Visit
Admission: RE | Admit: 2017-11-17 | Discharge: 2017-11-17 | Disposition: A | Discharge status: Home / Self Care | Payer: Medicare Other | Source: Ambulatory Visit | Attending: Obstetrics & Gynecology | Admitting: Obstetrics & Gynecology

## 2017-11-17 DIAGNOSIS — Z1231 Encounter for screening mammogram for malignant neoplasm of breast: Secondary | ICD-10-CM

## 2017-11-29 ENCOUNTER — Encounter (HOSPITAL_COMMUNITY): Payer: Medicare Other

## 2017-12-03 ENCOUNTER — Encounter (HOSPITAL_COMMUNITY)
Admission: RE | Admit: 2017-12-03 | Discharge: 2017-12-03 | Disposition: A | Discharge status: Home / Self Care | Payer: Medicare Other | Source: Ambulatory Visit | Attending: Gastroenterology | Admitting: Gastroenterology

## 2017-12-03 ENCOUNTER — Encounter (HOSPITAL_COMMUNITY): Payer: Self-pay

## 2017-12-03 DIAGNOSIS — K5090 Crohn's disease, unspecified, without complications: Secondary | ICD-10-CM | POA: Insufficient documentation

## 2017-12-03 DIAGNOSIS — Z5181 Encounter for therapeutic drug level monitoring: Secondary | ICD-10-CM | POA: Diagnosis not present

## 2017-12-03 MED ORDER — METHYLPREDNISOLONE SODIUM SUCC 40 MG IJ SOLR
20.0000 mg | INTRAMUSCULAR | Status: AC
  Administered 2017-12-03: 20 mg via INTRAVENOUS
  Filled 2017-12-03: qty 1

## 2017-12-03 MED ORDER — SODIUM CHLORIDE 0.9 % IV SOLN
INTRAVENOUS | Status: AC
  Administered 2017-12-03: 09:00:00 via INTRAVENOUS

## 2017-12-03 MED ORDER — ACETAMINOPHEN 325 MG PO TABS
650.0000 mg | ORAL_TABLET | ORAL | Status: AC
  Administered 2017-12-03: 650 mg via ORAL
  Filled 2017-12-03: qty 2

## 2017-12-03 MED ORDER — SODIUM CHLORIDE 0.9 % IV SOLN
400.0000 mg | INTRAVENOUS | Status: AC
  Administered 2017-12-03: 400 mg via INTRAVENOUS
  Filled 2017-12-03: qty 40

## 2017-12-03 NOTE — Discharge Instructions (Signed)
Infliximab injection What is this medicine? INFLIXIMAB (in [place] i mab) is used to treat Crohn's disease and ulcerative colitis. It is also used to treat ankylosing spondylitis, plaque psoriasis, and some forms of arthritis. This medicine may be used for other purposes; ask your health care provider or pharmacist if you have questions. COMMON BRAND NAME(S): INFLECTRA, Remicade, RENFLEXIS What should I tell my health care provider before I take this medicine? They need to know if you have any of these conditions: -cancer -current or past resident of [place] or [place] -diabetes -exposure to tuberculosis -Guillain-Barre syndrome -heart failure -hepatitis or liver disease -immune system problems -infection -lung or breathing disease, like COPD -multiple sclerosis -receiving phototherapy for the skin -seizure disorder -an unusual or allergic reaction to infliximab, mouse proteins, other medicines, foods, dyes, or preservatives -pregnant or trying to get pregnant -breast-feeding How should I use this medicine? This medicine is for injection into a vein. It is usually given by a health care professional in a hospital or clinic setting. A special MedGuide will be given to you by the pharmacist with each prescription and refill. Be sure to read this information carefully each time. Talk to your pediatrician regarding the use of this medicine in children. While this drug may be prescribed for children as young as 14 years of age for selected conditions, precautions do apply. Overdosage: If you think you have taken too much of this medicine contact a poison control center or emergency room at once. NOTE: This medicine is only for you. Do not share this medicine with others. What if I miss a dose? It is important not to miss your dose. Call your doctor or health care professional if you are unable to keep an appointment. What may interact with this medicine? Do not take this  medicine with any of the following medications: -biologic medicines such as abatacept, adalimumab, anakinra, certolizumab, etanercept, golimumab, rituximab, secukinumab, tocilizumab, tofactinib, ustekinumab -live vaccines This list may not describe all possible interactions. Give your health care provider a list of all the medicines, herbs, non-prescription drugs, or dietary supplements you use. Also tell them if you smoke, drink alcohol, or use illegal drugs. Some items may interact with your medicine. What should I watch for while using this medicine? Your condition will be monitored carefully while you are receiving this medicine. Visit your doctor or health care professional for regular checks on your progress. You may need blood work done while you are taking this medicine. Before beginning therapy, your doctor may do a test to see if you have been exposed to tuberculosis. Call your doctor or health care professional for advice if you get a fever, chills or sore throat, or other symptoms of a cold or flu. Do not treat yourself. This drug decreases your body's ability to fight infections. Try to avoid being around people who are sick. This medicine may make the symptoms of heart failure worse in some patients. If you notice symptoms such as increased shortness of breath or swelling of the ankles or legs, contact your health care provider right away. If you are going to have surgery or dental work, tell your health care professional or dentist that you have received this medicine. If you take this medicine for plaque psoriasis, stay out of the sun. If you cannot avoid being in the sun, wear protective clothing and use sunscreen. Do not use sun lamps or tanning beds/booths. Talk to your doctor about your risk of cancer. You may be  more at risk for certain types of cancers if you take this medicine. What side effects may I notice from receiving this medicine? Side effects that you should report to your  doctor or health care professional as soon as possible: -allergic reactions like skin rash, itching or hives, swelling of the face, lips, or tongue -breathing problems -changes in vision -chest pain -fever or chills, usually related to the infusion -joint pain -pain, tingling, numbness in the hands or feet -redness, blistering, peeling or loosening of the skin, including inside the mouth -seizures -signs of infection - fever or chills, cough, sore throat, flu-like symptoms, pain or difficulty passing urine -signs and symptoms of liver injury like dark yellow or brown urine; general ill feeling; light-colored stools; loss of appetite; nausea; right upper belly pain; unusually weak or tired; yellowing of the eyes or skin -signs and symptoms of a stroke like changes in vision; confusion; trouble speaking or understanding; severe headaches; sudden numbness or weakness of the face, arm or leg; trouble walking; dizziness; loss of balance or coordination -swelling of the ankles, feet, or hands -swollen lymph nodes in the neck, underarm, or groin areas -unusual bleeding or bruising -unusually weak or tired Side effects that usually do not require medical attention (report to your doctor or health care professional if they continue or are bothersome): -headache -nausea -stomach pain -upset stomach This list may not describe all possible side effects. Call your doctor for medical advice about side effects. You may report side effects to FDA at 1-800-FDA-1088. Where should I keep my medicine? This drug is given in a hospital or clinic and will not be stored at home. NOTE: This sheet is a summary. It may not cover all possible information. If you have questions about this medicine, talk to your doctor, pharmacist, or health care provider.  2018 Elsevier/Gold Standard (2016-12-09 13:45:32)

## 2017-12-20 ENCOUNTER — Other Ambulatory Visit: Payer: Self-pay | Admitting: Pulmonary Disease

## 2018-01-28 ENCOUNTER — Encounter (HOSPITAL_COMMUNITY): Payer: Self-pay

## 2018-01-28 ENCOUNTER — Encounter (HOSPITAL_COMMUNITY)
Admission: RE | Admit: 2018-01-28 | Discharge: 2018-01-28 | Disposition: A | Discharge status: Home / Self Care | Payer: Medicare Other | Source: Ambulatory Visit | Attending: Gastroenterology | Admitting: Gastroenterology

## 2018-01-28 DIAGNOSIS — Z5181 Encounter for therapeutic drug level monitoring: Secondary | ICD-10-CM | POA: Diagnosis not present

## 2018-01-28 DIAGNOSIS — K5090 Crohn's disease, unspecified, without complications: Secondary | ICD-10-CM | POA: Insufficient documentation

## 2018-01-28 MED ORDER — SODIUM CHLORIDE 0.9 % IV SOLN
INTRAVENOUS | Status: DC
  Administered 2018-01-28: 12:00:00 via INTRAVENOUS

## 2018-01-28 MED ORDER — ACETAMINOPHEN 325 MG PO TABS
650.0000 mg | ORAL_TABLET | ORAL | Status: DC
  Administered 2018-01-28: 650 mg via ORAL
  Filled 2018-01-28: qty 2

## 2018-01-28 MED ORDER — SODIUM CHLORIDE 0.9 % IV SOLN
400.0000 mg | INTRAVENOUS | Status: DC
  Administered 2018-01-28: 400 mg via INTRAVENOUS
  Filled 2018-01-28: qty 40

## 2018-01-28 MED ORDER — METHYLPREDNISOLONE SODIUM SUCC 40 MG IJ SOLR
20.0000 mg | INTRAMUSCULAR | Status: DC
  Administered 2018-01-28: 20 mg via INTRAVENOUS
  Filled 2018-01-28 (×2): qty 1

## 2018-01-28 NOTE — Discharge Instructions (Signed)
Infliximab injection What is this medicine? INFLIXIMAB (in [place] i mab) is used to treat Crohn's disease and ulcerative colitis. It is also used to treat ankylosing spondylitis, plaque psoriasis, and some forms of arthritis. This medicine may be used for other purposes; ask your health care provider or pharmacist if you have questions. COMMON BRAND NAME(S): INFLECTRA, Remicade, RENFLEXIS What should I tell my health care provider before I take this medicine? They need to know if you have any of these conditions: -cancer -current or past resident of [place] or [place] -diabetes -exposure to tuberculosis -Guillain-Barre syndrome -heart failure -hepatitis or liver disease -immune system problems -infection -lung or breathing disease, like COPD -multiple sclerosis -receiving phototherapy for the skin -seizure disorder -an unusual or allergic reaction to infliximab, mouse proteins, other medicines, foods, dyes, or preservatives -pregnant or trying to get pregnant -breast-feeding How should I use this medicine? This medicine is for injection into a vein. It is usually given by a health care professional in a hospital or clinic setting. A special MedGuide will be given to you by the pharmacist with each prescription and refill. Be sure to read this information carefully each time. Talk to your pediatrician regarding the use of this medicine in children. While this drug may be prescribed for children as young as 69 years of age for selected conditions, precautions do apply. Overdosage: If you think you have taken too much of this medicine contact a poison control center or emergency room at once. NOTE: This medicine is only for you. Do not share this medicine with others. What if I miss a dose? It is important not to miss your dose. Call your doctor or health care professional if you are unable to keep an appointment. What may interact with this medicine? Do not take this  medicine with any of the following medications: -biologic medicines such as abatacept, adalimumab, anakinra, certolizumab, etanercept, golimumab, rituximab, secukinumab, tocilizumab, tofactinib, ustekinumab -live vaccines This list may not describe all possible interactions. Give your health care provider a list of all the medicines, herbs, non-prescription drugs, or dietary supplements you use. Also tell them if you smoke, drink alcohol, or use illegal drugs. Some items may interact with your medicine. What should I watch for while using this medicine? Your condition will be monitored carefully while you are receiving this medicine. Visit your doctor or health care professional for regular checks on your progress. You may need blood work done while you are taking this medicine. Before beginning therapy, your doctor may do a test to see if you have been exposed to tuberculosis. Call your doctor or health care professional for advice if you get a fever, chills or sore throat, or other symptoms of a cold or flu. Do not treat yourself. This drug decreases your body's ability to fight infections. Try to avoid being around people who are sick. This medicine may make the symptoms of heart failure worse in some patients. If you notice symptoms such as increased shortness of breath or swelling of the ankles or legs, contact your health care provider right away. If you are going to have surgery or dental work, tell your health care professional or dentist that you have received this medicine. If you take this medicine for plaque psoriasis, stay out of the sun. If you cannot avoid being in the sun, wear protective clothing and use sunscreen. Do not use sun lamps or tanning beds/booths. Talk to your doctor about your risk of cancer. You may be  more at risk for certain types of cancers if you take this medicine. What side effects may I notice from receiving this medicine? Side effects that you should report to your  doctor or health care professional as soon as possible: -allergic reactions like skin rash, itching or hives, swelling of the face, lips, or tongue -breathing problems -changes in vision -chest pain -fever or chills, usually related to the infusion -joint pain -pain, tingling, numbness in the hands or feet -redness, blistering, peeling or loosening of the skin, including inside the mouth -seizures -signs of infection - fever or chills, cough, sore throat, flu-like symptoms, pain or difficulty passing urine -signs and symptoms of liver injury like dark yellow or brown urine; general ill feeling; light-colored stools; loss of appetite; nausea; right upper belly pain; unusually weak or tired; yellowing of the eyes or skin -signs and symptoms of a stroke like changes in vision; confusion; trouble speaking or understanding; severe headaches; sudden numbness or weakness of the face, arm or leg; trouble walking; dizziness; loss of balance or coordination -swelling of the ankles, feet, or hands -swollen lymph nodes in the neck, underarm, or groin areas -unusual bleeding or bruising -unusually weak or tired Side effects that usually do not require medical attention (report to your doctor or health care professional if they continue or are bothersome): -headache -nausea -stomach pain -upset stomach This list may not describe all possible side effects. Call your doctor for medical advice about side effects. You may report side effects to FDA at 1-800-FDA-1088. Where should I keep my medicine? This drug is given in a hospital or clinic and will not be stored at home. NOTE: This sheet is a summary. It may not cover all possible information. If you have questions about this medicine, talk to your doctor, pharmacist, or health care provider.  2018 Elsevier/Gold Standard (2016-12-09 13:45:32)

## 2018-03-09 ENCOUNTER — Other Ambulatory Visit: Payer: Self-pay | Admitting: Pulmonary Disease

## 2018-03-25 ENCOUNTER — Encounter (HOSPITAL_COMMUNITY): Payer: Self-pay

## 2018-03-25 ENCOUNTER — Encounter (HOSPITAL_COMMUNITY)
Admission: RE | Admit: 2018-03-25 | Discharge: 2018-03-25 | Disposition: A | Discharge status: Home / Self Care | Payer: Medicare Other | Source: Ambulatory Visit | Attending: Gastroenterology | Admitting: Gastroenterology

## 2018-03-25 DIAGNOSIS — Z5181 Encounter for therapeutic drug level monitoring: Secondary | ICD-10-CM | POA: Insufficient documentation

## 2018-03-25 DIAGNOSIS — K5090 Crohn's disease, unspecified, without complications: Secondary | ICD-10-CM | POA: Insufficient documentation

## 2018-03-25 MED ORDER — SODIUM CHLORIDE 0.9 % IV SOLN
INTRAVENOUS | Status: AC
  Administered 2018-03-25: 09:00:00 via INTRAVENOUS

## 2018-03-25 MED ORDER — SODIUM CHLORIDE 0.9 % IV SOLN
400.0000 mg | INTRAVENOUS | Status: AC
  Administered 2018-03-25: 400 mg via INTRAVENOUS
  Filled 2018-03-25: qty 40

## 2018-03-25 MED ORDER — METHYLPREDNISOLONE SODIUM SUCC 40 MG IJ SOLR
20.0000 mg | INTRAMUSCULAR | Status: AC
  Administered 2018-03-25: 20 mg via INTRAVENOUS
  Filled 2018-03-25: qty 1

## 2018-03-25 MED ORDER — ACETAMINOPHEN 325 MG PO TABS
650.0000 mg | ORAL_TABLET | ORAL | Status: AC
  Administered 2018-03-25: 650 mg via ORAL
  Filled 2018-03-25: qty 2

## 2018-05-25 ENCOUNTER — Encounter (HOSPITAL_COMMUNITY)
Admission: RE | Admit: 2018-05-25 | Discharge: 2018-05-25 | Disposition: A | Discharge status: Home / Self Care | Payer: Medicare Other | Source: Ambulatory Visit | Attending: Gastroenterology | Admitting: Gastroenterology

## 2018-05-25 ENCOUNTER — Encounter (HOSPITAL_COMMUNITY): Payer: Self-pay

## 2018-05-25 DIAGNOSIS — K5090 Crohn's disease, unspecified, without complications: Secondary | ICD-10-CM | POA: Insufficient documentation

## 2018-05-25 DIAGNOSIS — Z5181 Encounter for therapeutic drug level monitoring: Secondary | ICD-10-CM | POA: Diagnosis not present

## 2018-05-25 MED ORDER — METHYLPREDNISOLONE SODIUM SUCC 40 MG IJ SOLR
20.0000 mg | INTRAMUSCULAR | Status: DC
  Administered 2018-05-25: 20 mg via INTRAVENOUS
  Filled 2018-05-25: qty 1

## 2018-05-25 MED ORDER — INFLIXIMAB 100 MG IV SOLR
400.0000 mg | INTRAVENOUS | Status: DC
  Administered 2018-05-25: 400 mg via INTRAVENOUS
  Filled 2018-05-25: qty 40

## 2018-05-25 MED ORDER — SODIUM CHLORIDE 0.9 % IV SOLN
INTRAVENOUS | Status: DC
  Administered 2018-05-25: 11:00:00 via INTRAVENOUS

## 2018-05-25 MED ORDER — ACETAMINOPHEN 325 MG PO TABS
650.0000 mg | ORAL_TABLET | ORAL | Status: DC
  Administered 2018-05-25: 650 mg via ORAL
  Filled 2018-05-25: qty 2

## 2018-05-31 ENCOUNTER — Encounter (HOSPITAL_COMMUNITY): Payer: Medicare Other

## 2018-07-20 ENCOUNTER — Encounter (HOSPITAL_COMMUNITY): Payer: Self-pay

## 2018-07-20 ENCOUNTER — Encounter (HOSPITAL_COMMUNITY)
Admission: RE | Admit: 2018-07-20 | Discharge: 2018-07-20 | Disposition: A | Discharge status: Home / Self Care | Payer: Medicare Other | Source: Ambulatory Visit | Attending: Gastroenterology | Admitting: Gastroenterology

## 2018-07-20 DIAGNOSIS — Z5181 Encounter for therapeutic drug level monitoring: Secondary | ICD-10-CM | POA: Diagnosis not present

## 2018-07-20 DIAGNOSIS — K5090 Crohn's disease, unspecified, without complications: Secondary | ICD-10-CM | POA: Insufficient documentation

## 2018-07-20 MED ORDER — SODIUM CHLORIDE 0.9 % IV SOLN
INTRAVENOUS | Status: DC
  Administered 2018-07-20: 10:00:00 via INTRAVENOUS

## 2018-07-20 MED ORDER — SODIUM CHLORIDE 0.9 % IV SOLN
400.0000 mg | INTRAVENOUS | Status: DC
  Administered 2018-07-20: 400 mg via INTRAVENOUS
  Filled 2018-07-20: qty 40

## 2018-07-20 MED ORDER — METHYLPREDNISOLONE SODIUM SUCC 40 MG IJ SOLR
20.0000 mg | INTRAMUSCULAR | Status: DC
  Administered 2018-07-20: 20 mg via INTRAVENOUS
  Filled 2018-07-20: qty 1

## 2018-07-20 MED ORDER — ACETAMINOPHEN 325 MG PO TABS
650.0000 mg | ORAL_TABLET | ORAL | Status: DC
  Administered 2018-07-20: 650 mg via ORAL
  Filled 2018-07-20: qty 2

## 2018-08-03 ENCOUNTER — Other Ambulatory Visit: Payer: Self-pay | Admitting: Pulmonary Disease

## 2018-08-23 ENCOUNTER — Encounter: Payer: Self-pay | Admitting: Pulmonary Disease

## 2018-08-23 ENCOUNTER — Ambulatory Visit: Payer: Medicare Other | Admitting: Pulmonary Disease

## 2018-08-23 VITALS — BP 138/82 | HR 87 | Ht 64.0 in | Wt 204.8 lb

## 2018-08-23 DIAGNOSIS — Z23 Encounter for immunization: Secondary | ICD-10-CM | POA: Diagnosis not present

## 2018-08-23 DIAGNOSIS — J45909 Unspecified asthma, uncomplicated: Secondary | ICD-10-CM

## 2018-08-23 NOTE — Patient Instructions (Signed)
We will check a FENO level today He can start using the Advair as needed instead of regularly Keep albuterol rescue inhaler Monitor symptoms.  If there is any worsening with this change then go back to Advair daily Follow-up in 3 months.

## 2018-08-23 NOTE — Progress Notes (Signed)
[name]    [id number]    [birth year]  Primary Care Physician:Gates, Herbie [place], MD  Referring Physician: Josetta Huddle, MD [place]. [employer] [address], [place] [address]  Chief complaint:  Follow-up for mild asthma.  HPI: [name] is a 67 year old with moderate persistent asthma, sleep apnea. She is a former patient of Dr. Joya Gaskins. She is feeling well with no exacerbations, ER visits or hospitalizations. Denies any dyspnea, wheezing. She is maintained on Advair 100/50.  Her history is significant for Crohn's disease for which she is being treated with Remicade.  She has history of lymphoma involving the lung.  This presented as right suprahilar lung mass status post VATS with right upper lobe and right middle lobe wedge resection in 2006.  Interim History: Stable respiratory symptoms.  No complains of dyspnea, cough, wheezing, nighttime awakening She hardly needs to use her rescue inhaler.  Outpatient Encounter Medications as of 08/23/2018  Medication Sig  . acetaminophen (TYLENOL ARTHRITIS PAIN) 650 MG CR tablet Take 1,300 mg by mouth every 8 (eight) hours as needed for pain.  [name] ADVAIR DISKUS 100-50 MCG/DOSE AEPB inhale 1 dose by mouth twice a day  . albuterol (PROAIR HFA) 108 (90 BASE) MCG/ACT inhaler Inhale 2 puffs into the lungs every 6 (six) hours as needed for wheezing or shortness of breath.  [name] aspirin EC 81 MG tablet Take 81 mg by mouth daily.  . Canagliflozin (INVOKANA) 300 MG TABS Take 300 mg by mouth daily.  . Cholecalciferol 1000 units TBDP Take 1,000 Units by mouth daily.  [name] estradiol (ESTRACE) 1 MG tablet Take 1 mg by mouth daily.  . fluconazole (DIFLUCAN) 100 MG tablet Take 100 mg by mouth every 7 (seven) days.  . fluticasone (FLONASE) 50 MCG/ACT nasal spray Place 2 sprays into both nostrils daily.  . InFLIXimab (REMICADE IV) Inject into the vein every 8 (eight) weeks. For Crohn's  . montelukast (SINGULAIR) 10 MG tablet TAKE 1 TABLET BY MOUTH  AT  BEDTIME  . Multiple Vitamin (MULTIVITAMIN WITH MINERALS) TABS Take 1 tablet by mouth daily.  [name] omeprazole (PRILOSEC) 20 MG capsule TAKE 1 CAPSULE BY MOUTH  DAILY  . OVER THE COUNTER MEDICATION Take 2 tablets by mouth daily as needed. Sinus Medication.  . pregabalin (LYRICA) 50 MG capsule Take 50 mg by mouth 2 (two) times daily.  . sertraline (ZOLOFT) 100 MG tablet Take 100 mg by mouth daily.    . simvastatin (ZOCOR) 40 MG tablet Take 40 mg by mouth daily.  [name] losartan-hydrochlorothiazide (HYZAAR) 100-12.5 MG tablet Take 1 tablet by mouth daily.    No facility-administered encounter medications on file as of 08/23/2018.    Physical Exam: Blood pressure 126/84, pulse 74, height 5' 4"  (1.626 m), weight 92.8 kg (204 lb 9.6 oz), SpO2 96 %. Gen:      No acute distress HEENT:  EOMI, sclera anicteric Neck:     No masses; no thyromegaly Lungs:    Clear to auscultation bilaterally; normal respiratory effort CV:         Regular rate and rhythm; no murmurs Abd:      + bowel sounds; soft, non-tender; no palpable masses, no distension Ext:    No edema; adequate peripheral perfusion Skin:      Warm and dry; no rash Neuro: alert and oriented x 3 Psych: normal mood and affect  Data Reviewed: Imaging Chest x-ray 09/16/2017 Right middle lobe scarring, stable.  PFTs 08/26/2017 FVC 1.29 [  50%], FEV1 1.21 [1%), F/F 94, TLC 62%, DLCO 64%, DLCO/VA 155 Severe restriction, increased diffusion defect  FENO 08/23/2018-unable to complete.  Labs CBC 10/28/2017-WBC 6.1, eos 5.1%, absolute eosinophil count 311.  Assessment:  Mild asthma She has been stable clinically. We can use her Advair intermittently based on recent studies that show noninferiority to daily inhalers Continue Singulair, Flonase.  Restriction Restriction noted on PFTs.  This is likely secondary to her surgery, body habitus No clear evidence of interstitial lung disease.  Sleep apnea Not on CPAP. She feels that she does not have  symptoms of sleep apnea and is not interested in trying therapy.  Plan/Recommendations: - Advair as needed, continue albuterol rescue inhaler  Marshell Garfinkel MD [facility] Pager [phone] 08/23/2018, 10:59 AM  CC: Josetta Huddle, MD

## 2018-09-14 ENCOUNTER — Encounter (HOSPITAL_COMMUNITY)
Admission: RE | Admit: 2018-09-14 | Discharge: 2018-09-14 | Disposition: A | Discharge status: Home / Self Care | Payer: Medicare Other | Source: Ambulatory Visit | Attending: Gastroenterology | Admitting: Gastroenterology

## 2018-09-14 ENCOUNTER — Encounter (HOSPITAL_COMMUNITY): Payer: Self-pay

## 2018-09-14 DIAGNOSIS — K5090 Crohn's disease, unspecified, without complications: Secondary | ICD-10-CM | POA: Insufficient documentation

## 2018-09-14 DIAGNOSIS — Z5181 Encounter for therapeutic drug level monitoring: Secondary | ICD-10-CM | POA: Diagnosis not present

## 2018-09-14 MED ORDER — ACETAMINOPHEN 325 MG PO TABS
650.0000 mg | ORAL_TABLET | ORAL | Status: DC
  Administered 2018-09-14: 650 mg via ORAL
  Filled 2018-09-14: qty 2

## 2018-09-14 MED ORDER — METHYLPREDNISOLONE SODIUM SUCC 40 MG IJ SOLR
20.0000 mg | INTRAMUSCULAR | Status: DC
  Administered 2018-09-14: 20 mg via INTRAVENOUS
  Filled 2018-09-14: qty 1

## 2018-09-14 MED ORDER — SODIUM CHLORIDE 0.9 % IV SOLN
400.0000 mg | INTRAVENOUS | Status: DC
  Administered 2018-09-14: 400 mg via INTRAVENOUS
  Filled 2018-09-14: qty 40

## 2018-09-14 MED ORDER — SODIUM CHLORIDE 0.9 % IV SOLN
INTRAVENOUS | Status: DC
  Administered 2018-09-14: 09:00:00 via INTRAVENOUS

## 2018-09-29 ENCOUNTER — Other Ambulatory Visit: Payer: Self-pay | Admitting: Pulmonary Disease

## 2018-10-24 ENCOUNTER — Other Ambulatory Visit: Payer: Self-pay | Admitting: Obstetrics & Gynecology

## 2018-10-24 DIAGNOSIS — Z1231 Encounter for screening mammogram for malignant neoplasm of breast: Secondary | ICD-10-CM

## 2018-10-28 ENCOUNTER — Inpatient Hospital Stay: Payer: Medicare Other | Attending: Oncology | Admitting: Oncology

## 2018-10-28 ENCOUNTER — Telehealth: Payer: Self-pay | Admitting: Oncology

## 2018-10-28 ENCOUNTER — Inpatient Hospital Stay: Payer: Medicare Other

## 2018-10-28 VITALS — BP 123/72 | HR 84 | Temp 97.6°F | Resp 18 | Ht 64.0 in | Wt 203.4 lb

## 2018-10-28 DIAGNOSIS — C8519 Unspecified B-cell lymphoma, extranodal and solid organ sites: Secondary | ICD-10-CM | POA: Insufficient documentation

## 2018-10-28 DIAGNOSIS — K5090 Crohn's disease, unspecified, without complications: Secondary | ICD-10-CM | POA: Insufficient documentation

## 2018-10-28 DIAGNOSIS — R630 Anorexia: Secondary | ICD-10-CM | POA: Diagnosis not present

## 2018-10-28 DIAGNOSIS — R7989 Other specified abnormal findings of blood chemistry: Secondary | ICD-10-CM | POA: Insufficient documentation

## 2018-10-28 LAB — CBC WITH DIFFERENTIAL/PLATELET
ABS IMMATURE GRANULOCYTES: 0.01 10*3/uL (ref 0.00–0.07)
BASOS ABS: 0 10*3/uL (ref 0.0–0.1)
BASOS PCT: 1 %
EOS ABS: 0.2 10*3/uL (ref 0.0–0.5)
Eosinophils Relative: 3 %
HCT: 42.6 % (ref 36.0–46.0)
Hemoglobin: 14 g/dL (ref 12.0–15.0)
IMMATURE GRANULOCYTES: 0 %
LYMPHS ABS: 2.3 10*3/uL (ref 0.7–4.0)
Lymphocytes Relative: 31 %
MCH: 32.7 pg (ref 26.0–34.0)
MCHC: 32.9 g/dL (ref 30.0–36.0)
MCV: 99.5 fL (ref 80.0–100.0)
MONOS PCT: 8 %
Monocytes Absolute: 0.6 10*3/uL (ref 0.1–1.0)
NEUTROS ABS: 4.3 10*3/uL (ref 1.7–7.7)
NEUTROS PCT: 57 %
NRBC: 0 % (ref 0.0–0.2)
PLATELETS: 257 10*3/uL (ref 150–400)
RBC: 4.28 MIL/uL (ref 3.87–5.11)
RDW: 13.1 % (ref 11.5–15.5)
WBC: 7.5 10*3/uL (ref 4.0–10.5)

## 2018-10-28 LAB — BASIC METABOLIC PANEL
ANION GAP: 13 (ref 5–15)
BUN: 10 mg/dL (ref 8–23)
CALCIUM: 9.6 mg/dL (ref 8.9–10.3)
CO2: 28 mmol/L (ref 22–32)
Chloride: 101 mmol/L (ref 98–111)
Creatinine, Ser: 0.95 mg/dL (ref 0.44–1.00)
Glucose, Bld: 206 mg/dL — ABNORMAL HIGH (ref 70–99)
POTASSIUM: 3.6 mmol/L (ref 3.5–5.1)
Sodium: 142 mmol/L (ref 135–145)

## 2018-10-28 NOTE — Progress Notes (Signed)
  [facility] OFFICE PROGRESS NOTE   Diagnosis: Non-Hodgkin's lymphoma  INTERVAL HISTORY:   [name] returns for a scheduled visit.  She feels well.  No fever or night sweats.  She continues every 8-week Remicade.  No diarrhea or bleeding.  Objective:  Vital signs in last 24 hours:  Blood pressure 123/72, pulse 84, temperature 97.6 F (36.4 C), temperature source Oral, resp. rate 18, height _0  (1.626 m), weight 203 lb 6.4 oz (92.3 kg), SpO2 96 %.    HEENT: Neck without mass Lymphatics: Cervical, supraclavicular, axillary, or inguinal nodes Resp: Lungs with scattered rhonchi and wheezes at the upper posterior chest, no respiratory distress Cardio: Regular rate and rhythm GI: No hepatosplenomegaly, no mass, nontender Vascular: No leg edema, the left lower leg is larger than the right side  Skin: Keloid formation at the righ posterior lateral chest scar  Lab Results:  Lab Results  Component Value Date   WBC 7.5 10/28/2018   HGB 14.0 10/28/2018   HCT 42.6 10/28/2018   MCV 99.5 10/28/2018   PLT 257 10/28/2018   NEUTROABS 4.3 10/28/2018    CMP  Lab Results  Component Value Date   NA 142 10/28/2018   K 3.6 10/28/2018   CL 101 10/28/2018   CO2 28 10/28/2018   GLUCOSE 206 (H) 10/28/2018   BUN 10 10/28/2018   CREATININE 0.95 10/28/2018   CALCIUM 9.6 10/28/2018   PROT 7.1 10/28/2017   ALBUMIN 3.0 (L) 09/03/2014   AST 25 09/03/2014   ALT 24 09/03/2014   ALKPHOS 78 09/03/2014   BILITOT 0.55 09/03/2014   GFRNONAA >60 10/28/2018   GFRAA >60 10/28/2018   Serum M spike on 10/28/2017- 0.5 Medications: I have reviewed the patient's current medications.   Assessment/Plan: 1. Non-Hodgkin lymphoma, low grade lymphoma involving the lungs, diagnosed in June 2006. No clinical evidence of progression. 2. Crohn's disease, followed by Dr. Wynetta Emery.She continues Remicade 3. Post thoracotomy pain, persistent, initially improved with Elavil 4. Anemia secondary to  chronic disease and gastrointestinal bleeding. Hemoglobin is improved. 5. History of thrombocytosis. 6. History of anorexia/weight loss in the setting of active Crohn disease. Improved. 7. History of a serum monoclonal protein, stable on repeat serum protein electrophoresis studies, last in December 2017 8. History of asthma. .     Disposition: [name] remains in clinical remission from non-Hodgkin's lymphoma.  No evidence for progression to multiple myeloma.  We will follow-up on the serum protein electro pheresis from today.  She will return for an office and lab visit in 1 year.  15 minutes were spent with the patient today.  The majority of the time was used for counseling and coordination of care.  [name], MD  10/28/2018  12:08 PM

## 2018-10-28 NOTE — Telephone Encounter (Signed)
Printed calendar and avs. °

## 2018-10-31 LAB — PROTEIN ELECTROPHORESIS, SERUM
A/G RATIO SPE: 1.1 (ref 0.7–1.7)
ALBUMIN ELP: 3.8 g/dL (ref 2.9–4.4)
ALPHA-1-GLOBULIN: 0.2 g/dL (ref 0.0–0.4)
Alpha-2-Globulin: 0.7 g/dL (ref 0.4–1.0)
BETA GLOBULIN: 1.2 g/dL (ref 0.7–1.3)
GAMMA GLOBULIN: 1.3 g/dL (ref 0.4–1.8)
Globulin, Total: 3.4 g/dL (ref 2.2–3.9)
M-Spike, %: 0.5 g/dL — ABNORMAL HIGH
TOTAL PROTEIN ELP: 7.2 g/dL (ref 6.0–8.5)

## 2018-11-09 ENCOUNTER — Encounter (HOSPITAL_COMMUNITY)
Admission: RE | Admit: 2018-11-09 | Discharge: 2018-11-09 | Disposition: A | Discharge status: Home / Self Care | Payer: Medicare Other | Source: Ambulatory Visit | Attending: Gastroenterology | Admitting: Gastroenterology

## 2018-11-09 ENCOUNTER — Encounter (HOSPITAL_COMMUNITY): Payer: Self-pay

## 2018-11-09 DIAGNOSIS — K5090 Crohn's disease, unspecified, without complications: Secondary | ICD-10-CM | POA: Insufficient documentation

## 2018-11-09 DIAGNOSIS — Z5181 Encounter for therapeutic drug level monitoring: Secondary | ICD-10-CM | POA: Insufficient documentation

## 2018-11-09 MED ORDER — ACETAMINOPHEN 325 MG PO TABS
650.0000 mg | ORAL_TABLET | ORAL | Status: DC
  Administered 2018-11-09: 650 mg via ORAL
  Filled 2018-11-09: qty 2

## 2018-11-09 MED ORDER — METHYLPREDNISOLONE SODIUM SUCC 40 MG IJ SOLR
20.0000 mg | INTRAMUSCULAR | Status: DC
  Administered 2018-11-09: 20 mg via INTRAVENOUS
  Filled 2018-11-09: qty 1

## 2018-11-09 MED ORDER — SODIUM CHLORIDE 0.9 % IV SOLN
INTRAVENOUS | Status: DC
  Administered 2018-11-09: 10:00:00 via INTRAVENOUS

## 2018-11-09 MED ORDER — SODIUM CHLORIDE 0.9 % IV SOLN
400.0000 mg | INTRAVENOUS | Status: DC
  Administered 2018-11-09: 400 mg via INTRAVENOUS
  Filled 2018-11-09: qty 40

## 2018-11-09 NOTE — Discharge Instructions (Signed)
Infliximab injection What is this medicine? INFLIXIMAB (in [place] i mab) is used to treat Crohn's disease and ulcerative colitis. It is also used to treat ankylosing spondylitis, plaque psoriasis, and some forms of arthritis. This medicine may be used for other purposes; ask your health care provider or pharmacist if you have questions. COMMON BRAND NAME(S): INFLECTRA, Remicade, RENFLEXIS What should I tell my health care provider before I take this medicine? They need to know if you have any of these conditions: -cancer -current or past resident of [place] or [place] -diabetes -exposure to tuberculosis -Guillain-Barre syndrome -heart failure -hepatitis or liver disease -immune system problems -infection -lung or breathing disease, like COPD -multiple sclerosis -receiving phototherapy for the skin -seizure disorder -an unusual or allergic reaction to infliximab, mouse proteins, other medicines, foods, dyes, or preservatives -pregnant or trying to get pregnant -breast-feeding How should I use this medicine? This medicine is for injection into a vein. It is usually given by a health care professional in a hospital or clinic setting. A special MedGuide will be given to you by the pharmacist with each prescription and refill. Be sure to read this information carefully each time. Talk to your pediatrician regarding the use of this medicine in children. While this drug may be prescribed for children as young as 45 years of age for selected conditions, precautions do apply. Overdosage: If you think you have taken too much of this medicine contact a poison control center or emergency room at once. NOTE: This medicine is only for you. Do not share this medicine with others. What if I miss a dose? It is important not to miss your dose. Call your doctor or health care professional if you are unable to keep an appointment. What may interact with this medicine? Do not take this  medicine with any of the following medications: -biologic medicines such as abatacept, adalimumab, anakinra, certolizumab, etanercept, golimumab, rituximab, secukinumab, tocilizumab, tofactinib, ustekinumab -live vaccines This list may not describe all possible interactions. Give your health care provider a list of all the medicines, herbs, non-prescription drugs, or dietary supplements you use. Also tell them if you smoke, drink alcohol, or use illegal drugs. Some items may interact with your medicine. What should I watch for while using this medicine? Your condition will be monitored carefully while you are receiving this medicine. Visit your doctor or health care professional for regular checks on your progress. You may need blood work done while you are taking this medicine. Before beginning therapy, your doctor may do a test to see if you have been exposed to tuberculosis. Call your doctor or health care professional for advice if you get a fever, chills or sore throat, or other symptoms of a cold or flu. Do not treat yourself. This drug decreases your body's ability to fight infections. Try to avoid being around people who are sick. This medicine may make the symptoms of heart failure worse in some patients. If you notice symptoms such as increased shortness of breath or swelling of the ankles or legs, contact your health care provider right away. If you are going to have surgery or dental work, tell your health care professional or dentist that you have received this medicine. If you take this medicine for plaque psoriasis, stay out of the sun. If you cannot avoid being in the sun, wear protective clothing and use sunscreen. Do not use sun lamps or tanning beds/booths. Talk to your doctor about your risk of cancer. You may be  more at risk for certain types of cancers if you take this medicine. What side effects may I notice from receiving this medicine? Side effects that you should report to your  doctor or health care professional as soon as possible: -allergic reactions like skin rash, itching or hives, swelling of the face, lips, or tongue -breathing problems -changes in vision -chest pain -fever or chills, usually related to the infusion -joint pain -pain, tingling, numbness in the hands or feet -redness, blistering, peeling or loosening of the skin, including inside the mouth -seizures -signs of infection - fever or chills, cough, sore throat, flu-like symptoms, pain or difficulty passing urine -signs and symptoms of liver injury like dark yellow or brown urine; general ill feeling; light-colored stools; loss of appetite; nausea; right upper belly pain; unusually weak or tired; yellowing of the eyes or skin -signs and symptoms of a stroke like changes in vision; confusion; trouble speaking or understanding; severe headaches; sudden numbness or weakness of the face, arm or leg; trouble walking; dizziness; loss of balance or coordination -swelling of the ankles, feet, or hands -swollen lymph nodes in the neck, underarm, or groin areas -unusual bleeding or bruising -unusually weak or tired Side effects that usually do not require medical attention (report to your doctor or health care professional if they continue or are bothersome): -headache -nausea -stomach pain -upset stomach This list may not describe all possible side effects. Call your doctor for medical advice about side effects. You may report side effects to FDA at 1-800-FDA-1088. Where should I keep my medicine? This drug is given in a hospital or clinic and will not be stored at home. NOTE: This sheet is a summary. It may not cover all possible information. If you have questions about this medicine, talk to your doctor, pharmacist, or health care provider.  2019 Elsevier/Gold Standard (2016-12-09 13:45:32)

## 2018-12-05 ENCOUNTER — Ambulatory Visit
Admission: RE | Admit: 2018-12-05 | Discharge: 2018-12-05 | Disposition: A | Discharge status: Home / Self Care | Payer: Medicare Other | Source: Ambulatory Visit | Attending: Obstetrics & Gynecology | Admitting: Obstetrics & Gynecology

## 2018-12-05 DIAGNOSIS — Z1231 Encounter for screening mammogram for malignant neoplasm of breast: Secondary | ICD-10-CM

## 2019-01-02 ENCOUNTER — Telehealth (HOSPITAL_COMMUNITY): Payer: Self-pay | Admitting: General Practice

## 2019-01-06 ENCOUNTER — Encounter (HOSPITAL_COMMUNITY)
Admission: RE | Admit: 2019-01-06 | Discharge: 2019-01-06 | Disposition: A | Discharge status: Home / Self Care | Payer: Medicare Other | Source: Ambulatory Visit | Attending: Gastroenterology | Admitting: Gastroenterology

## 2019-01-06 ENCOUNTER — Encounter (HOSPITAL_COMMUNITY): Payer: Self-pay

## 2019-01-06 DIAGNOSIS — Z5181 Encounter for therapeutic drug level monitoring: Secondary | ICD-10-CM | POA: Insufficient documentation

## 2019-01-06 DIAGNOSIS — K5090 Crohn's disease, unspecified, without complications: Secondary | ICD-10-CM | POA: Insufficient documentation

## 2019-01-06 MED ORDER — ACETAMINOPHEN 325 MG PO TABS
650.0000 mg | ORAL_TABLET | ORAL | Status: AC
  Administered 2019-01-06: 650 mg via ORAL
  Filled 2019-01-06: qty 2

## 2019-01-06 MED ORDER — METHYLPREDNISOLONE SODIUM SUCC 40 MG IJ SOLR
20.0000 mg | INTRAMUSCULAR | Status: AC
  Administered 2019-01-06: 20 mg via INTRAVENOUS
  Filled 2019-01-06: qty 1

## 2019-01-06 MED ORDER — SODIUM CHLORIDE 0.9 % IV SOLN
400.0000 mg | INTRAVENOUS | Status: AC
  Administered 2019-01-06: 400 mg via INTRAVENOUS
  Filled 2019-01-06: qty 40

## 2019-01-06 MED ORDER — SODIUM CHLORIDE 0.9 % IV SOLN
INTRAVENOUS | Status: AC
  Administered 2019-01-06: 09:00:00 via INTRAVENOUS

## 2019-01-14 ENCOUNTER — Other Ambulatory Visit: Payer: Self-pay | Admitting: Pulmonary Disease

## 2019-03-03 ENCOUNTER — Other Ambulatory Visit: Payer: Self-pay

## 2019-03-03 ENCOUNTER — Encounter (HOSPITAL_COMMUNITY)
Admission: RE | Admit: 2019-03-03 | Discharge: 2019-03-03 | Disposition: A | Discharge status: Home / Self Care | Payer: Medicare Other | Source: Ambulatory Visit | Attending: Gastroenterology | Admitting: Gastroenterology

## 2019-03-03 DIAGNOSIS — K5090 Crohn's disease, unspecified, without complications: Secondary | ICD-10-CM | POA: Insufficient documentation

## 2019-03-03 DIAGNOSIS — Z5181 Encounter for therapeutic drug level monitoring: Secondary | ICD-10-CM | POA: Diagnosis not present

## 2019-03-03 LAB — COMPREHENSIVE METABOLIC PANEL
ALT: 29 U/L (ref 0–44)
AST: 26 U/L (ref 15–41)
Albumin: 3.8 g/dL (ref 3.5–5.0)
Alkaline Phosphatase: 56 U/L (ref 38–126)
Anion gap: 10 (ref 5–15)
BUN: 14 mg/dL (ref 8–23)
CO2: 26 mmol/L (ref 22–32)
Calcium: 9.4 mg/dL (ref 8.9–10.3)
Chloride: 102 mmol/L (ref 98–111)
Creatinine, Ser: 0.75 mg/dL (ref 0.44–1.00)
GFR calc Af Amer: >60 mL/min (ref >60)
GFR calc non Af Amer: >60 mL/min (ref >60)
Glucose, Bld: 159 mg/dL — ABNORMAL HIGH (ref 70–99)
Potassium: 3.6 mmol/L (ref 3.5–5.1)
Sodium: 138 mmol/L (ref 135–145)
Total Bilirubin: 0.8 mg/dL (ref 0.3–1.2)
Total Protein: 7.7 g/dL (ref 6.5–8.1)

## 2019-03-03 LAB — C-REACTIVE PROTEIN: CRP: <0.8 mg/dL

## 2019-03-03 LAB — GLUCOSE, CAPILLARY: Glucose-Capillary: 139 mg/dL — ABNORMAL HIGH (ref 70–99)

## 2019-03-03 MED ORDER — ACETAMINOPHEN 325 MG PO TABS
650.0000 mg | ORAL_TABLET | ORAL | Status: DC
  Administered 2019-03-03: 650 mg via ORAL
  Filled 2019-03-03: qty 2

## 2019-03-03 MED ORDER — SODIUM CHLORIDE 0.9 % IV SOLN
400.0000 mg | INTRAVENOUS | Status: DC
  Administered 2019-03-03: 400 mg via INTRAVENOUS
  Filled 2019-03-03: qty 40

## 2019-03-03 MED ORDER — METHYLPREDNISOLONE SODIUM SUCC 40 MG IJ SOLR
20.0000 mg | INTRAMUSCULAR | Status: DC
  Administered 2019-03-03: 20 mg via INTRAVENOUS
  Filled 2019-03-03: qty 1

## 2019-03-03 MED ORDER — SODIUM CHLORIDE 0.9 % IV SOLN
INTRAVENOUS | Status: AC
  Administered 2019-03-03: 10:00:00 via INTRAVENOUS

## 2019-05-03 ENCOUNTER — Ambulatory Visit (HOSPITAL_COMMUNITY)
Admission: RE | Admit: 2019-05-03 | Discharge: 2019-05-03 | Disposition: A | Discharge status: Home / Self Care | Payer: Medicare Other | Source: Ambulatory Visit | Attending: Gastroenterology | Admitting: Gastroenterology

## 2019-05-03 ENCOUNTER — Other Ambulatory Visit: Payer: Self-pay

## 2019-05-03 DIAGNOSIS — K5090 Crohn's disease, unspecified, without complications: Secondary | ICD-10-CM | POA: Insufficient documentation

## 2019-05-03 MED ORDER — SODIUM CHLORIDE 0.9 % IV SOLN
400.0000 mg | INTRAVENOUS | Status: DC
  Administered 2019-05-03: 11:00:00 400 mg via INTRAVENOUS
  Filled 2019-05-03: qty 40

## 2019-05-03 MED ORDER — SODIUM CHLORIDE 0.9 % IV SOLN
INTRAVENOUS | Status: AC
  Administered 2019-05-03: 10:00:00 via INTRAVENOUS

## 2019-05-03 MED ORDER — METHYLPREDNISOLONE SODIUM SUCC 40 MG IJ SOLR
20.0000 mg | INTRAMUSCULAR | Status: DC
  Administered 2019-05-03: 10:00:00 20 mg via INTRAVENOUS
  Filled 2019-05-03: qty 1

## 2019-05-03 MED ORDER — ACETAMINOPHEN 325 MG PO TABS
650.0000 mg | ORAL_TABLET | ORAL | Status: DC
  Administered 2019-05-03: 650 mg via ORAL
  Filled 2019-05-03: qty 2

## 2019-06-28 ENCOUNTER — Other Ambulatory Visit: Payer: Self-pay

## 2019-06-28 ENCOUNTER — Ambulatory Visit (HOSPITAL_COMMUNITY)
Admission: RE | Admit: 2019-06-28 | Discharge: 2019-06-28 | Disposition: A | Discharge status: Home / Self Care | Payer: Medicare Other | Source: Ambulatory Visit | Attending: Gastroenterology | Admitting: Gastroenterology

## 2019-06-28 DIAGNOSIS — K5090 Crohn's disease, unspecified, without complications: Secondary | ICD-10-CM | POA: Insufficient documentation

## 2019-06-28 LAB — C-REACTIVE PROTEIN: CRP: 0.8 mg/dL (ref <1.0)

## 2019-06-28 LAB — COMPREHENSIVE METABOLIC PANEL
ALT: 29 U/L (ref 0–44)
AST: 24 U/L (ref 15–41)
Albumin: 3.7 g/dL (ref 3.5–5.0)
Alkaline Phosphatase: 52 U/L (ref 38–126)
Anion gap: 11 (ref 5–15)
BUN: 11 mg/dL (ref 8–23)
CO2: 27 mmol/L (ref 22–32)
Calcium: 9.5 mg/dL (ref 8.9–10.3)
Chloride: 101 mmol/L (ref 98–111)
Creatinine, Ser: 0.86 mg/dL (ref 0.44–1.00)
GFR calc Af Amer: >60 mL/min (ref >60)
GFR calc non Af Amer: >60 mL/min (ref >60)
Glucose, Bld: 177 mg/dL — ABNORMAL HIGH (ref 70–99)
Potassium: 3.8 mmol/L (ref 3.5–5.1)
Sodium: 139 mmol/L (ref 135–145)
Total Bilirubin: 0.6 mg/dL (ref 0.3–1.2)
Total Protein: 7.5 g/dL (ref 6.5–8.1)

## 2019-06-28 MED ORDER — METHYLPREDNISOLONE SODIUM SUCC 40 MG IJ SOLR
20.0000 mg | INTRAMUSCULAR | Status: DC
  Administered 2019-06-28: 20 mg via INTRAVENOUS
  Filled 2019-06-28: qty 0.5

## 2019-06-28 MED ORDER — SODIUM CHLORIDE 0.9 % IV SOLN
400.0000 mg | INTRAVENOUS | Status: DC
  Administered 2019-06-28: 400 mg via INTRAVENOUS
  Filled 2019-06-28: qty 40

## 2019-06-28 MED ORDER — SODIUM CHLORIDE 0.9 % IV SOLN
INTRAVENOUS | Status: AC
  Administered 2019-06-28: 11:00:00 via INTRAVENOUS

## 2019-06-28 MED ORDER — ACETAMINOPHEN 325 MG PO TABS
650.0000 mg | ORAL_TABLET | ORAL | Status: DC
  Administered 2019-06-28: 650 mg via ORAL
  Filled 2019-06-28: qty 2

## 2019-06-28 MED ORDER — SODIUM CHLORIDE 0.9 % IV SOLN: INTRAVENOUS | Status: DC | PRN

## 2019-06-28 NOTE — Discharge Instructions (Signed)
Infliximab injection What is this medicine? INFLIXIMAB (in [place] i mab) is used to treat Crohn's disease and ulcerative colitis. It is also used to treat ankylosing spondylitis, plaque psoriasis, and some forms of arthritis. This medicine may be used for other purposes; ask your health care provider or pharmacist if you have questions. COMMON BRAND NAME(S): INFLECTRA, Remicade, RENFLEXIS What should I tell my health care provider before I take this medicine? They need to know if you have any of these conditions:  cancer  current or past resident of [place] or [place]  diabetes  exposure to tuberculosis  Guillain-Barre syndrome  heart failure  hepatitis or liver disease  immune system problems  infection  lung or breathing disease, like COPD  multiple sclerosis  receiving phototherapy for the skin  seizure disorder  an unusual or allergic reaction to infliximab, mouse proteins, other medicines, foods, dyes, or preservatives  pregnant or trying to get pregnant  breast-feeding How should I use this medicine? This medicine is for injection into a vein. It is usually given by a health care professional in a hospital or clinic setting. A special MedGuide will be given to you by the pharmacist with each prescription and refill. Be sure to read this information carefully each time. Talk to your pediatrician regarding the use of this medicine in children. While this drug may be prescribed for children as young as 4 years of age for selected conditions, precautions do apply. Overdosage: If you think you have taken too much of this medicine contact a poison control center or emergency room at once. NOTE: This medicine is only for you. Do not share this medicine with others. What if I miss a dose? It is important not to miss your dose. Call your doctor or health care professional if you are unable to keep an appointment. What may interact with this medicine? Do not  take this medicine with any of the following medications:  biologic medicines such as abatacept, adalimumab, anakinra, certolizumab, etanercept, golimumab, rituximab, secukinumab, tocilizumab, tofactinib, ustekinumab  live vaccines This list may not describe all possible interactions. Give your health care provider a list of all the medicines, herbs, non-prescription drugs, or dietary supplements you use. Also tell them if you smoke, drink alcohol, or use illegal drugs. Some items may interact with your medicine. What should I watch for while using this medicine? Your condition will be monitored carefully while you are receiving this medicine. Visit your doctor or health care professional for regular checks on your progress. You may need blood work done while you are taking this medicine. Before beginning therapy, your doctor may do a test to see if you have been exposed to tuberculosis. Call your doctor or health care professional for advice if you get a fever, chills or sore throat, or other symptoms of a cold or flu. Do not treat yourself. This drug decreases your body's ability to fight infections. Try to avoid being around people who are sick. This medicine may make the symptoms of heart failure worse in some patients. If you notice symptoms such as increased shortness of breath or swelling of the ankles or legs, contact your health care provider right away. If you are going to have surgery or dental work, tell your health care professional or dentist that you have received this medicine. If you take this medicine for plaque psoriasis, stay out of the sun. If you cannot avoid being in the sun, wear protective clothing and use sunscreen. Do not  use sun lamps or tanning beds/booths. Talk to your doctor about your risk of cancer. You may be more at risk for certain types of cancers if you take this medicine. What side effects may I notice from receiving this medicine? Side effects that you should  report to your doctor or health care professional as soon as possible:  allergic reactions like skin rash, itching or hives, swelling of the face, lips, or tongue  breathing problems  changes in vision  chest pain  fever or chills, usually related to the infusion  joint pain  pain, tingling, numbness in the hands or feet  redness, blistering, peeling or loosening of the skin, including inside the mouth  seizures  signs of infection - fever or chills, cough, sore throat, flu-like symptoms, pain or difficulty passing urine  signs and symptoms of liver injury like dark yellow or brown urine; general ill feeling; light-colored stools; loss of appetite; nausea; right upper belly pain; unusually weak or tired; yellowing of the eyes or skin  signs and symptoms of a stroke like changes in vision; confusion; trouble speaking or understanding; severe headaches; sudden numbness or weakness of the face, arm or leg; trouble walking; dizziness; loss of balance or coordination  swelling of the ankles, feet, or hands  swollen lymph nodes in the neck, underarm, or groin areas  unusual bleeding or bruising  unusually weak or tired Side effects that usually do not require medical attention (report to your doctor or health care professional if they continue or are bothersome):  headache  nausea  stomach pain  upset stomach This list may not describe all possible side effects. Call your doctor for medical advice about side effects. You may report side effects to FDA at 1-800-FDA-1088. Where should I keep my medicine? This drug is given in a hospital or clinic and will not be stored at home. NOTE: This sheet is a summary. It may not cover all possible information. If you have questions about this medicine, talk to your doctor, pharmacist, or health care provider.  2020 Elsevier/Gold Standard (2016-12-09 13:45:32)

## 2019-06-28 NOTE — Progress Notes (Signed)
Patient received Remicade as ordered via PIV. Tolerated well, vitals stable, discharge instructions given, verbalized understanding. Patient alert, oriented and ambulatory at the time of discharge.

## 2019-08-28 ENCOUNTER — Ambulatory Visit (HOSPITAL_COMMUNITY)
Admission: RE | Admit: 2019-08-28 | Discharge: 2019-08-28 | Disposition: A | Discharge status: Home / Self Care | Payer: Medicare Other | Source: Ambulatory Visit | Attending: Gastroenterology | Admitting: Gastroenterology

## 2019-08-28 ENCOUNTER — Other Ambulatory Visit: Payer: Self-pay

## 2019-08-28 DIAGNOSIS — K5090 Crohn's disease, unspecified, without complications: Secondary | ICD-10-CM | POA: Diagnosis not present

## 2019-08-28 MED ORDER — SODIUM CHLORIDE 0.9 % IV SOLN
INTRAVENOUS | Status: DC | PRN
  Administered 2019-08-28: 250 mL via INTRAVENOUS

## 2019-08-28 MED ORDER — ACETAMINOPHEN 325 MG PO TABS
650.0000 mg | ORAL_TABLET | Freq: Once | ORAL | Status: AC
  Administered 2019-08-28: 650 mg via ORAL
  Filled 2019-08-28: qty 2

## 2019-08-28 MED ORDER — METHYLPREDNISOLONE SODIUM SUCC 40 MG IJ SOLR
20.0000 mg | Freq: Once | INTRAMUSCULAR | Status: AC
  Administered 2019-08-28: 20 mg via INTRAVENOUS
  Filled 2019-08-28: qty 0.5

## 2019-08-28 MED ORDER — SODIUM CHLORIDE 0.9 % IV SOLN
5.0000 mg/kg | INTRAVENOUS | Status: DC
  Administered 2019-08-28: 500 mg via INTRAVENOUS
  Filled 2019-08-28: qty 50

## 2019-08-28 NOTE — Discharge Instructions (Signed)
Infliximab injection What is this medicine? INFLIXIMAB (in [place] i mab) is used to treat Crohn's disease and ulcerative colitis. It is also used to treat ankylosing spondylitis, plaque psoriasis, and some forms of arthritis. This medicine may be used for other purposes; ask your health care provider or pharmacist if you have questions. COMMON BRAND NAME(S): INFLECTRA, Remicade, RENFLEXIS What should I tell my health care provider before I take this medicine? They need to know if you have any of these conditions:  cancer  current or past resident of [place] or [place]  diabetes  exposure to tuberculosis  Guillain-Barre syndrome  heart failure  hepatitis or liver disease  immune system problems  infection  lung or breathing disease, like COPD  multiple sclerosis  receiving phototherapy for the skin  seizure disorder  an unusual or allergic reaction to infliximab, mouse proteins, other medicines, foods, dyes, or preservatives  pregnant or trying to get pregnant  breast-feeding How should I use this medicine? This medicine is for injection into a vein. It is usually given by a health care professional in a hospital or clinic setting. A special MedGuide will be given to you by the pharmacist with each prescription and refill. Be sure to read this information carefully each time. Talk to your pediatrician regarding the use of this medicine in children. While this drug may be prescribed for children as young as 17 years of age for selected conditions, precautions do apply. Overdosage: If you think you have taken too much of this medicine contact a poison control center or emergency room at once. NOTE: This medicine is only for you. Do not share this medicine with others. What if I miss a dose? It is important not to miss your dose. Call your doctor or health care professional if you are unable to keep an appointment. What may interact with this medicine? Do not  take this medicine with any of the following medications:  biologic medicines such as abatacept, adalimumab, anakinra, certolizumab, etanercept, golimumab, rituximab, secukinumab, tocilizumab, tofactinib, ustekinumab  live vaccines This list may not describe all possible interactions. Give your health care provider a list of all the medicines, herbs, non-prescription drugs, or dietary supplements you use. Also tell them if you smoke, drink alcohol, or use illegal drugs. Some items may interact with your medicine. What should I watch for while using this medicine? Your condition will be monitored carefully while you are receiving this medicine. Visit your doctor or health care professional for regular checks on your progress. You may need blood work done while you are taking this medicine. Before beginning therapy, your doctor may do a test to see if you have been exposed to tuberculosis. Call your doctor or health care professional for advice if you get a fever, chills or sore throat, or other symptoms of a cold or flu. Do not treat yourself. This drug decreases your body's ability to fight infections. Try to avoid being around people who are sick. This medicine may make the symptoms of heart failure worse in some patients. If you notice symptoms such as increased shortness of breath or swelling of the ankles or legs, contact your health care provider right away. If you are going to have surgery or dental work, tell your health care professional or dentist that you have received this medicine. If you take this medicine for plaque psoriasis, stay out of the sun. If you cannot avoid being in the sun, wear protective clothing and use sunscreen. Do not  use sun lamps or tanning beds/booths. Talk to your doctor about your risk of cancer. You may be more at risk for certain types of cancers if you take this medicine. What side effects may I notice from receiving this medicine? Side effects that you should  report to your doctor or health care professional as soon as possible:  allergic reactions like skin rash, itching or hives, swelling of the face, lips, or tongue  breathing problems  changes in vision  chest pain  fever or chills, usually related to the infusion  joint pain  pain, tingling, numbness in the hands or feet  redness, blistering, peeling or loosening of the skin, including inside the mouth  seizures  signs of infection - fever or chills, cough, sore throat, flu-like symptoms, pain or difficulty passing urine  signs and symptoms of liver injury like dark yellow or brown urine; general ill feeling; light-colored stools; loss of appetite; nausea; right upper belly pain; unusually weak or tired; yellowing of the eyes or skin  signs and symptoms of a stroke like changes in vision; confusion; trouble speaking or understanding; severe headaches; sudden numbness or weakness of the face, arm or leg; trouble walking; dizziness; loss of balance or coordination  swelling of the ankles, feet, or hands  swollen lymph nodes in the neck, underarm, or groin areas  unusual bleeding or bruising  unusually weak or tired Side effects that usually do not require medical attention (report to your doctor or health care professional if they continue or are bothersome):  headache  nausea  stomach pain  upset stomach This list may not describe all possible side effects. Call your doctor for medical advice about side effects. You may report side effects to FDA at 1-800-FDA-1088. Where should I keep my medicine? This drug is given in a hospital or clinic and will not be stored at home. NOTE: This sheet is a summary. It may not cover all possible information. If you have questions about this medicine, talk to your doctor, pharmacist, or health care provider.  2020 Elsevier/Gold Standard (2016-12-09 13:45:32)

## 2019-08-28 NOTE — Progress Notes (Signed)
Patient received Remicade, titrated as ordered via PIV. Tolerated well, vitals stable, discharge instructions given, verbalized understanding. Patient alert, oriented and ambulatory at the time of discharge.

## 2019-10-23 ENCOUNTER — Other Ambulatory Visit: Payer: Self-pay

## 2019-10-23 ENCOUNTER — Ambulatory Visit (HOSPITAL_COMMUNITY)
Admission: RE | Admit: 2019-10-23 | Discharge: 2019-10-23 | Disposition: A | Discharge status: Home / Self Care | Payer: Medicare Other | Source: Ambulatory Visit | Attending: Internal Medicine | Admitting: Internal Medicine

## 2019-10-23 DIAGNOSIS — K5090 Crohn's disease, unspecified, without complications: Secondary | ICD-10-CM | POA: Diagnosis not present

## 2019-10-23 LAB — COMPREHENSIVE METABOLIC PANEL
ALT: 43 U/L (ref 0–44)
AST: 40 U/L (ref 15–41)
Albumin: 3.7 g/dL (ref 3.5–5.0)
Alkaline Phosphatase: 57 U/L (ref 38–126)
Anion gap: 11 (ref 5–15)
BUN: 11 mg/dL (ref 8–23)
CO2: 26 mmol/L (ref 22–32)
Calcium: 9.4 mg/dL (ref 8.9–10.3)
Chloride: 100 mmol/L (ref 98–111)
Creatinine, Ser: 0.67 mg/dL (ref 0.44–1.00)
GFR calc Af Amer: >60 mL/min (ref >60)
GFR calc non Af Amer: >60 mL/min (ref >60)
Glucose, Bld: 224 mg/dL — ABNORMAL HIGH (ref 70–99)
Potassium: 3.5 mmol/L (ref 3.5–5.1)
Sodium: 137 mmol/L (ref 135–145)
Total Bilirubin: 1 mg/dL (ref 0.3–1.2)
Total Protein: 7.2 g/dL (ref 6.5–8.1)

## 2019-10-23 LAB — C-REACTIVE PROTEIN: CRP: <0.8 mg/dL (ref <1.0)

## 2019-10-23 MED ORDER — ACETAMINOPHEN 325 MG PO TABS
650.0000 mg | ORAL_TABLET | Freq: Once | ORAL | Status: AC
  Administered 2019-10-23: 650 mg via ORAL
  Filled 2019-10-23: qty 2

## 2019-10-23 MED ORDER — METHYLPREDNISOLONE SODIUM SUCC 40 MG IJ SOLR
20.0000 mg | Freq: Once | INTRAMUSCULAR | Status: AC
  Administered 2019-10-23: 20 mg via INTRAVENOUS
  Filled 2019-10-23 (×2): qty 0.5

## 2019-10-23 MED ORDER — SODIUM CHLORIDE 0.9 % IV SOLN
400.0000 mg | Freq: Once | INTRAVENOUS | Status: AC
  Administered 2019-10-23: 12:00:00 400 mg via INTRAVENOUS
  Filled 2019-10-23: qty 40

## 2019-10-23 MED ORDER — SODIUM CHLORIDE 0.9 % IV SOLN
INTRAVENOUS | Status: DC | PRN
  Administered 2019-10-23: 250 mL via INTRAVENOUS

## 2019-10-23 NOTE — Progress Notes (Addendum)
Patient received IV Remicade, titrated per protocol.  Pre infusion Tylenol and solu-medrol were given as ordered.Tolerated well, vitals stable, discharge instructions given, verbalized understanding. Patient alert, oriented and ambulatory at the time of discharge.

## 2019-10-23 NOTE — Discharge Instructions (Signed)
Infliximab injection What is this medicine? INFLIXIMAB (in [place] i mab) is used to treat Crohn's disease and ulcerative colitis. It is also used to treat ankylosing spondylitis, plaque psoriasis, and some forms of arthritis. This medicine may be used for other purposes; ask your health care provider or pharmacist if you have questions. COMMON BRAND NAME(S): INFLECTRA, Remicade, RENFLEXIS What should I tell my health care provider before I take this medicine? They need to know if you have any of these conditions:  cancer  current or past resident of [place] or [place]  diabetes  exposure to tuberculosis  Guillain-Barre syndrome  heart failure  hepatitis or liver disease  immune system problems  infection  lung or breathing disease, like COPD  multiple sclerosis  receiving phototherapy for the skin  seizure disorder  an unusual or allergic reaction to infliximab, mouse proteins, other medicines, foods, dyes, or preservatives  pregnant or trying to get pregnant  breast-feeding How should I use this medicine? This medicine is for injection into a vein. It is usually given by a health care professional in a hospital or clinic setting. A special MedGuide will be given to you by the pharmacist with each prescription and refill. Be sure to read this information carefully each time. Talk to your pediatrician regarding the use of this medicine in children. While this drug may be prescribed for children as young as 79 years of age for selected conditions, precautions do apply. Overdosage: If you think you have taken too much of this medicine contact a poison control center or emergency room at once. NOTE: This medicine is only for you. Do not share this medicine with others. What if I miss a dose? It is important not to miss your dose. Call your doctor or health care professional if you are unable to keep an appointment. What may interact with this medicine? Do not  take this medicine with any of the following medications:  biologic medicines such as abatacept, adalimumab, anakinra, certolizumab, etanercept, golimumab, rituximab, secukinumab, tocilizumab, tofactinib, ustekinumab  live vaccines This list may not describe all possible interactions. Give your health care provider a list of all the medicines, herbs, non-prescription drugs, or dietary supplements you use. Also tell them if you smoke, drink alcohol, or use illegal drugs. Some items may interact with your medicine. What should I watch for while using this medicine? Your condition will be monitored carefully while you are receiving this medicine. Visit your doctor or health care professional for regular checks on your progress. You may need blood work done while you are taking this medicine. Before beginning therapy, your doctor may do a test to see if you have been exposed to tuberculosis. Call your doctor or health care professional for advice if you get a fever, chills or sore throat, or other symptoms of a cold or flu. Do not treat yourself. This drug decreases your body's ability to fight infections. Try to avoid being around people who are sick. This medicine may make the symptoms of heart failure worse in some patients. If you notice symptoms such as increased shortness of breath or swelling of the ankles or legs, contact your health care provider right away. If you are going to have surgery or dental work, tell your health care professional or dentist that you have received this medicine. If you take this medicine for plaque psoriasis, stay out of the sun. If you cannot avoid being in the sun, wear protective clothing and use sunscreen. Do not  use sun lamps or tanning beds/booths. Talk to your doctor about your risk of cancer. You may be more at risk for certain types of cancers if you take this medicine. What side effects may I notice from receiving this medicine? Side effects that you should  report to your doctor or health care professional as soon as possible:  allergic reactions like skin rash, itching or hives, swelling of the face, lips, or tongue  breathing problems  changes in vision  chest pain  fever or chills, usually related to the infusion  joint pain  pain, tingling, numbness in the hands or feet  redness, blistering, peeling or loosening of the skin, including inside the mouth  seizures  signs of infection - fever or chills, cough, sore throat, flu-like symptoms, pain or difficulty passing urine  signs and symptoms of liver injury like dark yellow or brown urine; general ill feeling; light-colored stools; loss of appetite; nausea; right upper belly pain; unusually weak or tired; yellowing of the eyes or skin  signs and symptoms of a stroke like changes in vision; confusion; trouble speaking or understanding; severe headaches; sudden numbness or weakness of the face, arm or leg; trouble walking; dizziness; loss of balance or coordination  swelling of the ankles, feet, or hands  swollen lymph nodes in the neck, underarm, or groin areas  unusual bleeding or bruising  unusually weak or tired Side effects that usually do not require medical attention (report to your doctor or health care professional if they continue or are bothersome):  headache  nausea  stomach pain  upset stomach This list may not describe all possible side effects. Call your doctor for medical advice about side effects. You may report side effects to FDA at 1-800-FDA-1088. Where should I keep my medicine? This drug is given in a hospital or clinic and will not be stored at home. NOTE: This sheet is a summary. It may not cover all possible information. If you have questions about this medicine, talk to your doctor, pharmacist, or health care provider.  2020 Elsevier/Gold Standard (2016-12-09 13:45:32)

## 2019-10-30 ENCOUNTER — Telehealth: Payer: Self-pay | Admitting: Oncology

## 2019-10-30 ENCOUNTER — Inpatient Hospital Stay: Payer: Medicare Other | Attending: Oncology

## 2019-10-30 ENCOUNTER — Inpatient Hospital Stay (HOSPITAL_BASED_OUTPATIENT_CLINIC_OR_DEPARTMENT_OTHER): Payer: Medicare Other | Admitting: Oncology

## 2019-10-30 ENCOUNTER — Other Ambulatory Visit: Payer: Self-pay

## 2019-10-30 VITALS — BP 143/69 | HR 77 | Temp 98.0°F | Resp 18 | Ht 64.0 in | Wt 210.9 lb

## 2019-10-30 DIAGNOSIS — K5090 Crohn's disease, unspecified, without complications: Secondary | ICD-10-CM | POA: Diagnosis not present

## 2019-10-30 DIAGNOSIS — C8519 Unspecified B-cell lymphoma, extranodal and solid organ sites: Secondary | ICD-10-CM | POA: Diagnosis not present

## 2019-10-30 DIAGNOSIS — J45909 Unspecified asthma, uncomplicated: Secondary | ICD-10-CM | POA: Insufficient documentation

## 2019-10-30 DIAGNOSIS — C8582 Other specified types of non-Hodgkin lymphoma, intrathoracic lymph nodes: Secondary | ICD-10-CM | POA: Diagnosis present

## 2019-10-30 LAB — CBC WITH DIFFERENTIAL (CANCER CENTER ONLY)
Abs Immature Granulocytes: 0.01 10*3/uL (ref 0.00–0.07)
Basophils Absolute: 0.1 10*3/uL (ref 0.0–0.1)
Basophils Relative: 1 %
Eosinophils Absolute: 0.4 10*3/uL (ref 0.0–0.5)
Eosinophils Relative: 5 %
HCT: 41.3 % (ref 36.0–46.0)
Hemoglobin: 14 g/dL (ref 12.0–15.0)
Immature Granulocytes: 0 %
Lymphocytes Relative: 35 %
Lymphs Abs: 2.5 10*3/uL (ref 0.7–4.0)
MCH: 33.5 pg (ref 26.0–34.0)
MCHC: 33.9 g/dL (ref 30.0–36.0)
MCV: 98.8 fL (ref 80.0–100.0)
Monocytes Absolute: 0.5 10*3/uL (ref 0.1–1.0)
Monocytes Relative: 7 %
Neutro Abs: 3.7 10*3/uL (ref 1.7–7.7)
Neutrophils Relative %: 52 %
Platelet Count: 237 10*3/uL (ref 150–400)
RBC: 4.18 MIL/uL (ref 3.87–5.11)
RDW: 12.8 % (ref 11.5–15.5)
WBC Count: 7.1 10*3/uL (ref 4.0–10.5)
nRBC: 0 % (ref 0.0–0.2)

## 2019-10-30 LAB — BASIC METABOLIC PANEL - CANCER CENTER ONLY
Anion gap: 9 (ref 5–15)
BUN: 9 mg/dL (ref 8–23)
CO2: 31 mmol/L (ref 22–32)
Calcium: 9.4 mg/dL (ref 8.9–10.3)
Chloride: 98 mmol/L (ref 98–111)
Creatinine: 0.92 mg/dL (ref 0.44–1.00)
GFR, Est AFR Am: >60 mL/min (ref >60)
GFR, Estimated: >60 mL/min (ref >60)
Glucose, Bld: 260 mg/dL — ABNORMAL HIGH (ref 70–99)
Potassium: 4 mmol/L (ref 3.5–5.1)
Sodium: 138 mmol/L (ref 135–145)

## 2019-10-30 NOTE — Progress Notes (Signed)
  [facility] OFFICE PROGRESS NOTE   Diagnosis: Non-Hodgkin's lymphoma  INTERVAL HISTORY:   [name] returns as scheduled.  She feels well.  No fever or anorexia.  She continues Remicade for treatment of inflammatory bowel disease.  No complaint today.  Stable discomfort at the right chest wall, maintained on gabapentin.  Objective:  Vital signs in last 24 hours:  Blood pressure (!) 143/69, pulse 77, temperature 98 F (36.7 C), temperature source Temporal, resp. rate 18, height 5' 4"  (1.626 m), weight 210 lb 14.4 oz (95.7 kg), SpO2 96 %.    HEENT: Neck without mass Lymphatics: No cervical, supraclavicular, axillary, or inguinal nodes GI: No hepatosplenomegaly, no mass, nontender    Lab Results:  Lab Results  Component Value Date   WBC 7.1 10/30/2019   HGB 14.0 10/30/2019   HCT 41.3 10/30/2019   MCV 98.8 10/30/2019   PLT 237 10/30/2019   NEUTROABS 3.7 10/30/2019    CMP  Lab Results  Component Value Date   NA 138 10/30/2019   K 4.0 10/30/2019   CL 98 10/30/2019   CO2 31 10/30/2019   GLUCOSE 260 (H) 10/30/2019   BUN 9 10/30/2019   CREATININE 0.92 10/30/2019   CALCIUM 9.4 10/30/2019   PROT 7.2 10/23/2019   ALBUMIN 3.7 10/23/2019   AST 40 10/23/2019   ALT 43 10/23/2019   ALKPHOS 57 10/23/2019   BILITOT 1.0 10/23/2019   GFRNONAA >60 10/30/2019   GFRAA >60 10/30/2019    Medications: I have reviewed the patient's current medications.   Assessment/Plan: 1. Non-Hodgkin lymphoma, low grade lymphoma involving the lungs, diagnosed in June 2006. No clinical evidence of progression. 2. Crohn's disease, followed by Dr. Wynetta Emery.She continues Remicade 3. Post thoracotomy pain, persistent, initially improved with Elavil, now taking gabapentin 4. Anemia secondary to chronic disease and gastrointestinal bleeding. Hemoglobin is improved. 5. History of thrombocytosis. 6. History of anorexia/weight loss in the setting of active Crohn disease. Improved. 7.  History of a serum monoclonal protein, stable on repeat serum protein electrophoresis studies, last in December 2019 8. History of asthma. .     Disposition: [name] remains in clinical remission from [place].  She has a history of a serum monoclonal protein.  We will follow-up on the serum protein electrophoresis from today.  She will return for an office and lab visit in 1 year.  Ashley Coder, MD  10/30/2019  1:43 PM

## 2019-10-30 NOTE — Telephone Encounter (Signed)
Scheduled appt per 12/07 los - gave patient AVS and calender per los.

## 2019-10-31 LAB — PROTEIN ELECTROPHORESIS, SERUM
A/G Ratio: 1.1 (ref 0.7–1.7)
Albumin ELP: 3.5 g/dL (ref 2.9–4.4)
Alpha-1-Globulin: 0.2 g/dL (ref 0.0–0.4)
Alpha-2-Globulin: 0.6 g/dL (ref 0.4–1.0)
Beta Globulin: 1.1 g/dL (ref 0.7–1.3)
Gamma Globulin: 1.3 g/dL (ref 0.4–1.8)
Globulin, Total: 3.3 g/dL (ref 2.2–3.9)
M-Spike, %: 0.5 g/dL — ABNORMAL HIGH
Total Protein ELP: 6.8 g/dL (ref 6.0–8.5)

## 2019-11-01 ENCOUNTER — Telehealth: Payer: Self-pay | Admitting: *Deleted

## 2019-11-01 NOTE — Telephone Encounter (Signed)
Notified of message below

## 2019-11-01 NOTE — Telephone Encounter (Signed)
-----   Message from Tania Ade, RN sent at 11/01/2019  1:49 PM EST -----  ----- Message ----- From: Ladell Pier, MD Sent: 10/31/2019   6:11 PM EST To: [facility]  Please call patient, monoclonal protein is stable, f/u as scheduled

## 2019-11-30 ENCOUNTER — Other Ambulatory Visit: Payer: Self-pay | Admitting: Obstetrics & Gynecology

## 2019-11-30 DIAGNOSIS — Z1231 Encounter for screening mammogram for malignant neoplasm of breast: Secondary | ICD-10-CM

## 2019-12-04 ENCOUNTER — Ambulatory Visit (HOSPITAL_COMMUNITY)
Admission: RE | Admit: 2019-12-04 | Discharge: 2019-12-04 | Disposition: A | Discharge status: Home / Self Care | Payer: Medicare Other | Source: Ambulatory Visit | Attending: Gastroenterology | Admitting: Gastroenterology

## 2019-12-04 ENCOUNTER — Other Ambulatory Visit: Payer: Self-pay

## 2019-12-04 DIAGNOSIS — K5090 Crohn's disease, unspecified, without complications: Secondary | ICD-10-CM | POA: Insufficient documentation

## 2019-12-04 MED ORDER — ACETAMINOPHEN 325 MG PO TABS
650.0000 mg | ORAL_TABLET | Freq: Once | ORAL | Status: AC
  Administered 2019-12-04: 650 mg via ORAL
  Filled 2019-12-04: qty 2

## 2019-12-04 MED ORDER — SODIUM CHLORIDE 0.9 % IV SOLN
INTRAVENOUS | Status: DC | PRN
  Administered 2019-12-04: 10:00:00 250 mL via INTRAVENOUS

## 2019-12-04 MED ORDER — SODIUM CHLORIDE 0.9 % IV SOLN: INTRAVENOUS | Status: DC | PRN

## 2019-12-04 MED ORDER — METHYLPREDNISOLONE SODIUM SUCC 40 MG IJ SOLR
20.0000 mg | Freq: Once | INTRAMUSCULAR | Status: AC
  Administered 2019-12-04: 20 mg via INTRAVENOUS
  Filled 2019-12-04: qty 1

## 2019-12-04 MED ORDER — SODIUM CHLORIDE 0.9 % IV SOLN
400.0000 mg | INTRAVENOUS | Status: DC
  Administered 2019-12-04: 400 mg via INTRAVENOUS
  Filled 2019-12-04: qty 40

## 2019-12-04 NOTE — Discharge Instructions (Signed)
Infliximab injection What is this medicine? INFLIXIMAB (in [place] i mab) is used to treat Crohn's disease and ulcerative colitis. It is also used to treat ankylosing spondylitis, plaque psoriasis, and some forms of arthritis. This medicine may be used for other purposes; ask your health care provider or pharmacist if you have questions. COMMON BRAND NAME(S): AVSOLA, INFLECTRA, Remicade, RENFLEXIS What should I tell my health care provider before I take this medicine? They need to know if you have any of these conditions:  cancer  current or past resident of [place] or [place]  diabetes  exposure to tuberculosis  Guillain-Barre syndrome  heart failure  hepatitis or liver disease  immune system problems  infection  lung or breathing disease, like COPD  multiple sclerosis  receiving phototherapy for the skin  seizure disorder  an unusual or allergic reaction to infliximab, mouse proteins, other medicines, foods, dyes, or preservatives  pregnant or trying to get pregnant  breast-feeding How should I use this medicine? This medicine is for injection into a vein. It is usually given by a health care professional in a hospital or clinic setting. A special MedGuide will be given to you by the pharmacist with each prescription and refill. Be sure to read this information carefully each time. Talk to your pediatrician regarding the use of this medicine in children. While this drug may be prescribed for children as young as 21 years of age for selected conditions, precautions do apply. Overdosage: If you think you have taken too much of this medicine contact a poison control center or emergency room at once. NOTE: This medicine is only for you. Do not share this medicine with others. What if I miss a dose? It is important not to miss your dose. Call your doctor or health care professional if you are unable to keep an appointment. What may interact with this  medicine? Do not take this medicine with any of the following medications:  biologic medicines such as abatacept, adalimumab, anakinra, certolizumab, etanercept, golimumab, rituximab, secukinumab, tocilizumab, tofactinib, ustekinumab  live vaccines This list may not describe all possible interactions. Give your health care provider a list of all the medicines, herbs, non-prescription drugs, or dietary supplements you use. Also tell them if you smoke, drink alcohol, or use illegal drugs. Some items may interact with your medicine. What should I watch for while using this medicine? Your condition will be monitored carefully while you are receiving this medicine. Visit your doctor or health care professional for regular checks on your progress. You may need blood work done while you are taking this medicine. Before beginning therapy, your doctor may do a test to see if you have been exposed to tuberculosis. Call your doctor or health care professional for advice if you get a fever, chills or sore throat, or other symptoms of a cold or flu. Do not treat yourself. This drug decreases your body's ability to fight infections. Try to avoid being around people who are sick. This medicine may make the symptoms of heart failure worse in some patients. If you notice symptoms such as increased shortness of breath or swelling of the ankles or legs, contact your health care provider right away. If you are going to have surgery or dental work, tell your health care professional or dentist that you have received this medicine. If you take this medicine for plaque psoriasis, stay out of the sun. If you cannot avoid being in the sun, wear protective clothing and use sunscreen. Do  not use sun lamps or tanning beds/booths. Talk to your doctor about your risk of cancer. You may be more at risk for certain types of cancers if you take this medicine. What side effects may I notice from receiving this medicine? Side effects  that you should report to your doctor or health care professional as soon as possible:  allergic reactions like skin rash, itching or hives, swelling of the face, lips, or tongue  breathing problems  changes in vision  chest pain  fever or chills, usually related to the infusion  joint pain  pain, tingling, numbness in the hands or feet  redness, blistering, peeling or loosening of the skin, including inside the mouth  seizures  signs of infection - fever or chills, cough, sore throat, flu-like symptoms, pain or difficulty passing urine  signs and symptoms of liver injury like dark yellow or brown urine; general ill feeling; light-colored stools; loss of appetite; nausea; right upper belly pain; unusually weak or tired; yellowing of the eyes or skin  signs and symptoms of a stroke like changes in vision; confusion; trouble speaking or understanding; severe headaches; sudden numbness or weakness of the face, arm or leg; trouble walking; dizziness; loss of balance or coordination  swelling of the ankles, feet, or hands  swollen lymph nodes in the neck, underarm, or groin areas  unusual bleeding or bruising  unusually weak or tired Side effects that usually do not require medical attention (report to your doctor or health care professional if they continue or are bothersome):  headache  nausea  stomach pain  upset stomach This list may not describe all possible side effects. Call your doctor for medical advice about side effects. You may report side effects to FDA at 1-800-FDA-1088. Where should I keep my medicine? This drug is given in a hospital or clinic and will not be stored at home. NOTE: This sheet is a summary. It may not cover all possible information. If you have questions about this medicine, talk to your doctor, pharmacist, or health care provider.  2020 Elsevier/Gold Standard (2016-12-09 13:45:32)

## 2019-12-04 NOTE — Progress Notes (Signed)
PATIENT CARE CENTER NOTE  Diagnosis: Crohn's Disease   Provider: Wilford Corner, MD   Procedure: IV Remicade    Note: Patient received Remicade infusion via PIV. Pre-medications given per order. Infusion titrated per protocol. Patient tolerated well with no adverse reaction. Vital signs remained stable. Discharge instructions given. Patient to come back in 8 weeks for next dose. Alert, oriented and ambulatory at discharge.

## 2020-01-11 ENCOUNTER — Ambulatory Visit: Payer: Medicare Other

## 2020-01-22 ENCOUNTER — Other Ambulatory Visit: Payer: Self-pay

## 2020-01-22 ENCOUNTER — Ambulatory Visit (HOSPITAL_COMMUNITY)
Admission: RE | Admit: 2020-01-22 | Discharge: 2020-01-22 | Disposition: A | Discharge status: Home / Self Care | Payer: Medicare Other | Source: Ambulatory Visit | Attending: Gastroenterology | Admitting: Gastroenterology

## 2020-01-22 DIAGNOSIS — K5090 Crohn's disease, unspecified, without complications: Secondary | ICD-10-CM | POA: Diagnosis not present

## 2020-01-22 MED ORDER — SODIUM CHLORIDE 0.9 % IV SOLN
500.0000 mg | INTRAVENOUS | Status: DC
  Administered 2020-01-22: 500 mg via INTRAVENOUS
  Filled 2020-01-22: qty 50

## 2020-01-22 MED ORDER — SODIUM CHLORIDE 0.9 % IV SOLN
INTRAVENOUS | Status: DC | PRN
  Administered 2020-01-22: 250 mL via INTRAVENOUS

## 2020-01-22 MED ORDER — METHYLPREDNISOLONE SODIUM SUCC 40 MG IJ SOLR
20.0000 mg | Freq: Once | INTRAMUSCULAR | Status: AC
  Administered 2020-01-22: 09:00:00 20 mg via INTRAVENOUS
  Filled 2020-01-22: qty 1

## 2020-01-22 MED ORDER — ACETAMINOPHEN 325 MG PO TABS
650.0000 mg | ORAL_TABLET | Freq: Once | ORAL | Status: AC
  Administered 2020-01-22: 650 mg via ORAL
  Filled 2020-01-22: qty 2

## 2020-01-22 NOTE — Discharge Instructions (Signed)
Infliximab injection What is this medicine? INFLIXIMAB (in [place] i mab) is used to treat Crohn's disease and ulcerative colitis. It is also used to treat ankylosing spondylitis, plaque psoriasis, and some forms of arthritis. This medicine may be used for other purposes; ask your health care provider or pharmacist if you have questions. COMMON BRAND NAME(S): AVSOLA, INFLECTRA, Remicade, RENFLEXIS What should I tell my health care provider before I take this medicine? They need to know if you have any of these conditions:  cancer  current or past resident of [place] or [place]  diabetes  exposure to tuberculosis  Guillain-Barre syndrome  heart failure  hepatitis or liver disease  immune system problems  infection  lung or breathing disease, like COPD  multiple sclerosis  receiving phototherapy for the skin  seizure disorder  an unusual or allergic reaction to infliximab, mouse proteins, other medicines, foods, dyes, or preservatives  pregnant or trying to get pregnant  breast-feeding How should I use this medicine? This medicine is for injection into a vein. It is usually given by a health care professional in a hospital or clinic setting. A special MedGuide will be given to you by the pharmacist with each prescription and refill. Be sure to read this information carefully each time. Talk to your pediatrician regarding the use of this medicine in children. While this drug may be prescribed for children as young as 71 years of age for selected conditions, precautions do apply. Overdosage: If you think you have taken too much of this medicine contact a poison control center or emergency room at once. NOTE: This medicine is only for you. Do not share this medicine with others. What if I miss a dose? It is important not to miss your dose. Call your doctor or health care professional if you are unable to keep an appointment. What may interact with this  medicine? Do not take this medicine with any of the following medications:  biologic medicines such as abatacept, adalimumab, anakinra, certolizumab, etanercept, golimumab, rituximab, secukinumab, tocilizumab, tofactinib, ustekinumab  live vaccines This list may not describe all possible interactions. Give your health care provider a list of all the medicines, herbs, non-prescription drugs, or dietary supplements you use. Also tell them if you smoke, drink alcohol, or use illegal drugs. Some items may interact with your medicine. What should I watch for while using this medicine? Your condition will be monitored carefully while you are receiving this medicine. Visit your doctor or health care professional for regular checks on your progress. You may need blood work done while you are taking this medicine. Before beginning therapy, your doctor may do a test to see if you have been exposed to tuberculosis. Call your doctor or health care professional for advice if you get a fever, chills or sore throat, or other symptoms of a cold or flu. Do not treat yourself. This drug decreases your body's ability to fight infections. Try to avoid being around people who are sick. This medicine may make the symptoms of heart failure worse in some patients. If you notice symptoms such as increased shortness of breath or swelling of the ankles or legs, contact your health care provider right away. If you are going to have surgery or dental work, tell your health care professional or dentist that you have received this medicine. If you take this medicine for plaque psoriasis, stay out of the sun. If you cannot avoid being in the sun, wear protective clothing and use sunscreen. Do  not use sun lamps or tanning beds/booths. Talk to your doctor about your risk of cancer. You may be more at risk for certain types of cancers if you take this medicine. What side effects may I notice from receiving this medicine? Side effects  that you should report to your doctor or health care professional as soon as possible:  allergic reactions like skin rash, itching or hives, swelling of the face, lips, or tongue  breathing problems  changes in vision  chest pain  fever or chills, usually related to the infusion  joint pain  pain, tingling, numbness in the hands or feet  redness, blistering, peeling or loosening of the skin, including inside the mouth  seizures  signs of infection - fever or chills, cough, sore throat, flu-like symptoms, pain or difficulty passing urine  signs and symptoms of liver injury like dark yellow or brown urine; general ill feeling; light-colored stools; loss of appetite; nausea; right upper belly pain; unusually weak or tired; yellowing of the eyes or skin  signs and symptoms of a stroke like changes in vision; confusion; trouble speaking or understanding; severe headaches; sudden numbness or weakness of the face, arm or leg; trouble walking; dizziness; loss of balance or coordination  swelling of the ankles, feet, or hands  swollen lymph nodes in the neck, underarm, or groin areas  unusual bleeding or bruising  unusually weak or tired Side effects that usually do not require medical attention (report to your doctor or health care professional if they continue or are bothersome):  headache  nausea  stomach pain  upset stomach This list may not describe all possible side effects. Call your doctor for medical advice about side effects. You may report side effects to FDA at 1-800-FDA-1088. Where should I keep my medicine? This drug is given in a hospital or clinic and will not be stored at home. NOTE: This sheet is a summary. It may not cover all possible information. If you have questions about this medicine, talk to your doctor, pharmacist, or health care provider.  2020 Elsevier/Gold Standard (2016-12-09 13:45:32)

## 2020-01-22 NOTE — Progress Notes (Signed)
PATIENT CARE CENTER NOTE  Diagnosis: Crohn's Disease   Provider: Wilford Corner, MD   Procedure: IV Remicade    Note: Patient received Remicade infusion via PIV. Pre-medications given per order. Infusion titrated per protocol. Patient tolerated well with no adverse reaction. Vital signs remained stable. Discharge instructions given. Patient to come back in 8 weeks for next dose. Alert, oriented and ambulatory at discharge.

## 2020-02-14 ENCOUNTER — Ambulatory Visit
Admission: RE | Admit: 2020-02-14 | Discharge: 2020-02-14 | Disposition: A | Discharge status: Home / Self Care | Payer: Medicare Other | Source: Ambulatory Visit | Attending: Obstetrics & Gynecology | Admitting: Obstetrics & Gynecology

## 2020-02-14 ENCOUNTER — Other Ambulatory Visit: Payer: Self-pay

## 2020-02-14 DIAGNOSIS — Z1231 Encounter for screening mammogram for malignant neoplasm of breast: Secondary | ICD-10-CM

## 2020-03-18 ENCOUNTER — Ambulatory Visit (HOSPITAL_COMMUNITY)
Admission: RE | Admit: 2020-03-18 | Discharge: 2020-03-18 | Disposition: A | Discharge status: Home / Self Care | Payer: Medicare Other | Source: Ambulatory Visit | Attending: Gastroenterology | Admitting: Gastroenterology

## 2020-03-18 ENCOUNTER — Other Ambulatory Visit: Payer: Self-pay

## 2020-03-18 DIAGNOSIS — K5090 Crohn's disease, unspecified, without complications: Secondary | ICD-10-CM | POA: Insufficient documentation

## 2020-03-18 MED ORDER — METHYLPREDNISOLONE SODIUM SUCC 40 MG IJ SOLR
20.0000 mg | Freq: Once | INTRAMUSCULAR | Status: AC
  Administered 2020-03-18: 20 mg via INTRAVENOUS
  Filled 2020-03-18: qty 1

## 2020-03-18 MED ORDER — SODIUM CHLORIDE 0.9 % IV SOLN
500.0000 mg | INTRAVENOUS | Status: DC
  Administered 2020-03-18: 10:00:00 500 mg via INTRAVENOUS
  Filled 2020-03-18: qty 50

## 2020-03-18 MED ORDER — ACETAMINOPHEN 325 MG PO TABS
650.0000 mg | ORAL_TABLET | Freq: Once | ORAL | Status: AC
  Administered 2020-03-18: 10:00:00 650 mg via ORAL
  Filled 2020-03-18: qty 2

## 2020-03-18 MED ORDER — SODIUM CHLORIDE 0.9 % IV SOLN
INTRAVENOUS | Status: DC | PRN
  Administered 2020-03-18: 10:00:00 250 mL via INTRAVENOUS

## 2020-03-18 NOTE — Discharge Instructions (Signed)
Infliximab injection What is this medicine? INFLIXIMAB (in [place] i mab) is used to treat Crohn's disease and ulcerative colitis. It is also used to treat ankylosing spondylitis, plaque psoriasis, and some forms of arthritis. This medicine may be used for other purposes; ask your health care provider or pharmacist if you have questions. COMMON BRAND NAME(S): AVSOLA, INFLECTRA, Remicade, RENFLEXIS What should I tell my health care provider before I take this medicine? They need to know if you have any of these conditions:  cancer  current or past resident of [place] or [place]  diabetes  exposure to tuberculosis  Guillain-Barre syndrome  heart failure  hepatitis or liver disease  immune system problems  infection  lung or breathing disease, like COPD  multiple sclerosis  receiving phototherapy for the skin  seizure disorder  an unusual or allergic reaction to infliximab, mouse proteins, other medicines, foods, dyes, or preservatives  pregnant or trying to get pregnant  breast-feeding How should I use this medicine? This medicine is for injection into a vein. It is usually given by a health care professional in a hospital or clinic setting. A special MedGuide will be given to you by the pharmacist with each prescription and refill. Be sure to read this information carefully each time. Talk to your pediatrician regarding the use of this medicine in children. While this drug may be prescribed for children as young as 48 years of age for selected conditions, precautions do apply. Overdosage: If you think you have taken too much of this medicine contact a poison control center or emergency room at once. NOTE: This medicine is only for you. Do not share this medicine with others. What if I miss a dose? It is important not to miss your dose. Call your doctor or health care professional if you are unable to keep an appointment. What may interact with this  medicine? Do not take this medicine with any of the following medications:  biologic medicines such as abatacept, adalimumab, anakinra, certolizumab, etanercept, golimumab, rituximab, secukinumab, tocilizumab, tofactinib, ustekinumab  live vaccines This list may not describe all possible interactions. Give your health care provider a list of all the medicines, herbs, non-prescription drugs, or dietary supplements you use. Also tell them if you smoke, drink alcohol, or use illegal drugs. Some items may interact with your medicine. What should I watch for while using this medicine? Your condition will be monitored carefully while you are receiving this medicine. Visit your doctor or health care professional for regular checks on your progress. You may need blood work done while you are taking this medicine. Before beginning therapy, your doctor may do a test to see if you have been exposed to tuberculosis. Call your doctor or health care professional for advice if you get a fever, chills or sore throat, or other symptoms of a cold or flu. Do not treat yourself. This drug decreases your body's ability to fight infections. Try to avoid being around people who are sick. This medicine may make the symptoms of heart failure worse in some patients. If you notice symptoms such as increased shortness of breath or swelling of the ankles or legs, contact your health care provider right away. If you are going to have surgery or dental work, tell your health care professional or dentist that you have received this medicine. If you take this medicine for plaque psoriasis, stay out of the sun. If you cannot avoid being in the sun, wear protective clothing and use sunscreen. Do  not use sun lamps or tanning beds/booths. Talk to your doctor about your risk of cancer. You may be more at risk for certain types of cancers if you take this medicine. What side effects may I notice from receiving this medicine? Side effects  that you should report to your doctor or health care professional as soon as possible:  allergic reactions like skin rash, itching or hives, swelling of the face, lips, or tongue  breathing problems  changes in vision  chest pain  fever or chills, usually related to the infusion  joint pain  pain, tingling, numbness in the hands or feet  redness, blistering, peeling or loosening of the skin, including inside the mouth  seizures  signs of infection - fever or chills, cough, sore throat, flu-like symptoms, pain or difficulty passing urine  signs and symptoms of liver injury like dark yellow or brown urine; general ill feeling; light-colored stools; loss of appetite; nausea; right upper belly pain; unusually weak or tired; yellowing of the eyes or skin  signs and symptoms of a stroke like changes in vision; confusion; trouble speaking or understanding; severe headaches; sudden numbness or weakness of the face, arm or leg; trouble walking; dizziness; loss of balance or coordination  swelling of the ankles, feet, or hands  swollen lymph nodes in the neck, underarm, or groin areas  unusual bleeding or bruising  unusually weak or tired Side effects that usually do not require medical attention (report to your doctor or health care professional if they continue or are bothersome):  headache  nausea  stomach pain  upset stomach This list may not describe all possible side effects. Call your doctor for medical advice about side effects. You may report side effects to FDA at 1-800-FDA-1088. Where should I keep my medicine? This drug is given in a hospital or clinic and will not be stored at home. NOTE: This sheet is a summary. It may not cover all possible information. If you have questions about this medicine, talk to your doctor, pharmacist, or health care provider.  2020 Elsevier/Gold Standard (2016-12-09 13:45:32)

## 2020-03-18 NOTE — Progress Notes (Signed)
PATIENT CARE CENTER NOTE  Provider: Wilford Corner MD   Procedure: Patient received IV Remicade. Pre-medications given per order. Infusion titrated per protocol.Tolerated well, vitals stable, discharge instructions given, verbalized understanding. Patient declined post infusion observation. Alert, oriented and ambulatory at the time of discharge.    Note:

## 2020-05-13 ENCOUNTER — Ambulatory Visit (HOSPITAL_COMMUNITY)
Admission: RE | Admit: 2020-05-13 | Discharge: 2020-05-13 | Disposition: A | Discharge status: Home / Self Care | Payer: Medicare Other | Source: Ambulatory Visit | Attending: Gastroenterology | Admitting: Gastroenterology

## 2020-05-13 DIAGNOSIS — K5010 Crohn's disease of large intestine without complications: Secondary | ICD-10-CM | POA: Insufficient documentation

## 2020-05-13 MED ORDER — ACETAMINOPHEN 325 MG PO TABS
650.0000 mg | ORAL_TABLET | Freq: Once | ORAL | Status: AC
  Administered 2020-05-13: 650 mg via ORAL
  Filled 2020-05-13: qty 2

## 2020-05-13 MED ORDER — METHYLPREDNISOLONE SODIUM SUCC 40 MG IJ SOLR
20.0000 mg | Freq: Once | INTRAMUSCULAR | Status: AC
  Administered 2020-05-13: 20 mg via INTRAVENOUS
  Filled 2020-05-13: qty 1

## 2020-05-13 MED ORDER — SODIUM CHLORIDE 0.9 % IV SOLN
INTRAVENOUS | Status: DC | PRN
  Administered 2020-05-13: 250 mL via INTRAVENOUS

## 2020-05-13 MED ORDER — SODIUM CHLORIDE 0.9 % IV SOLN
5.0000 mg/kg | INTRAVENOUS | Status: DC
  Administered 2020-05-13: 500 mg via INTRAVENOUS
  Filled 2020-05-13: qty 50

## 2020-05-13 NOTE — Progress Notes (Signed)
PATIENT CARE CENTER NOTE  Diagnosis:Crohn's Disease   Provider:Schooler, Evette Doffing, MD   Procedure:IV Remicade   Note:Patient received Remicade infusion via PIV. Pre-medications given per order. Infusion titrated per protocol. Patient tolerated well with no adverse reaction. Vital signs remained stable. Discharge instructions given. Patient to come back in 8 weeks for next dose. Alert, oriented and ambulatory at discharge.

## 2020-05-13 NOTE — Discharge Instructions (Signed)
Infliximab injection What is this medicine? INFLIXIMAB (in [place] i mab) is used to treat Crohn's disease and ulcerative colitis. It is also used to treat ankylosing spondylitis, plaque psoriasis, and some forms of arthritis. This medicine may be used for other purposes; ask your health care provider or pharmacist if you have questions. COMMON BRAND NAME(S): AVSOLA, INFLECTRA, Remicade, RENFLEXIS What should I tell my health care provider before I take this medicine? They need to know if you have any of these conditions:  cancer  current or past resident of [place] or [place]  diabetes  exposure to tuberculosis  Guillain-Barre syndrome  heart failure  hepatitis or liver disease  immune system problems  infection  lung or breathing disease, like COPD  multiple sclerosis  receiving phototherapy for the skin  seizure disorder  an unusual or allergic reaction to infliximab, mouse proteins, other medicines, foods, dyes, or preservatives  pregnant or trying to get pregnant  breast-feeding How should I use this medicine? This medicine is for injection into a vein. It is usually given by a health care professional in a hospital or clinic setting. A special MedGuide will be given to you by the pharmacist with each prescription and refill. Be sure to read this information carefully each time. Talk to your pediatrician regarding the use of this medicine in children. While this drug may be prescribed for children as young as 84 years of age for selected conditions, precautions do apply. Overdosage: If you think you have taken too much of this medicine contact a poison control center or emergency room at once. NOTE: This medicine is only for you. Do not share this medicine with others. What if I miss a dose? It is important not to miss your dose. Call your doctor or health care professional if you are unable to keep an appointment. What may interact with this  medicine? Do not take this medicine with any of the following medications:  biologic medicines such as abatacept, adalimumab, anakinra, certolizumab, etanercept, golimumab, rituximab, secukinumab, tocilizumab, tofactinib, ustekinumab  live vaccines This list may not describe all possible interactions. Give your health care provider a list of all the medicines, herbs, non-prescription drugs, or dietary supplements you use. Also tell them if you smoke, drink alcohol, or use illegal drugs. Some items may interact with your medicine. What should I watch for while using this medicine? Your condition will be monitored carefully while you are receiving this medicine. Visit your doctor or health care professional for regular checks on your progress. You may need blood work done while you are taking this medicine. Before beginning therapy, your doctor may do a test to see if you have been exposed to tuberculosis. Call your doctor or health care professional for advice if you get a fever, chills or sore throat, or other symptoms of a cold or flu. Do not treat yourself. This drug decreases your body's ability to fight infections. Try to avoid being around people who are sick. This medicine may make the symptoms of heart failure worse in some patients. If you notice symptoms such as increased shortness of breath or swelling of the ankles or legs, contact your health care provider right away. If you are going to have surgery or dental work, tell your health care professional or dentist that you have received this medicine. If you take this medicine for plaque psoriasis, stay out of the sun. If you cannot avoid being in the sun, wear protective clothing and use sunscreen. Do  not use sun lamps or tanning beds/booths. Talk to your doctor about your risk of cancer. You may be more at risk for certain types of cancers if you take this medicine. What side effects may I notice from receiving this medicine? Side effects  that you should report to your doctor or health care professional as soon as possible:  allergic reactions like skin rash, itching or hives, swelling of the face, lips, or tongue  breathing problems  changes in vision  chest pain  fever or chills, usually related to the infusion  joint pain  pain, tingling, numbness in the hands or feet  redness, blistering, peeling or loosening of the skin, including inside the mouth  seizures  signs of infection - fever or chills, cough, sore throat, flu-like symptoms, pain or difficulty passing urine  signs and symptoms of liver injury like dark yellow or brown urine; general ill feeling; light-colored stools; loss of appetite; nausea; right upper belly pain; unusually weak or tired; yellowing of the eyes or skin  signs and symptoms of a stroke like changes in vision; confusion; trouble speaking or understanding; severe headaches; sudden numbness or weakness of the face, arm or leg; trouble walking; dizziness; loss of balance or coordination  swelling of the ankles, feet, or hands  swollen lymph nodes in the neck, underarm, or groin areas  unusual bleeding or bruising  unusually weak or tired Side effects that usually do not require medical attention (report to your doctor or health care professional if they continue or are bothersome):  headache  nausea  stomach pain  upset stomach This list may not describe all possible side effects. Call your doctor for medical advice about side effects. You may report side effects to FDA at 1-800-FDA-1088. Where should I keep my medicine? This drug is given in a hospital or clinic and will not be stored at home. NOTE: This sheet is a summary. It may not cover all possible information. If you have questions about this medicine, talk to your doctor, pharmacist, or health care provider.  2020 Elsevier/Gold Standard (2016-12-09 13:45:32)

## 2020-07-08 ENCOUNTER — Ambulatory Visit (HOSPITAL_COMMUNITY)
Admission: RE | Admit: 2020-07-08 | Discharge: 2020-07-08 | Disposition: A | Discharge status: Home / Self Care | Payer: Medicare Other | Source: Ambulatory Visit | Attending: Gastroenterology | Admitting: Gastroenterology

## 2020-07-08 DIAGNOSIS — K5010 Crohn's disease of large intestine without complications: Secondary | ICD-10-CM | POA: Diagnosis not present

## 2020-07-08 MED ORDER — SODIUM CHLORIDE 0.9 % IV SOLN
INTRAVENOUS | Status: DC | PRN
  Administered 2020-07-08: 250 mL via INTRAVENOUS

## 2020-07-08 MED ORDER — ACETAMINOPHEN 325 MG PO TABS
650.0000 mg | ORAL_TABLET | Freq: Once | ORAL | Status: AC
  Administered 2020-07-08: 650 mg via ORAL
  Filled 2020-07-08: qty 2

## 2020-07-08 MED ORDER — SODIUM CHLORIDE 0.9 % IV SOLN
400.0000 mg | INTRAVENOUS | Status: DC
  Administered 2020-07-08: 400 mg via INTRAVENOUS
  Filled 2020-07-08: qty 40

## 2020-07-08 MED ORDER — METHYLPREDNISOLONE SODIUM SUCC 40 MG IJ SOLR
20.0000 mg | Freq: Once | INTRAMUSCULAR | Status: AC
  Administered 2020-07-08: 20 mg via INTRAVENOUS
  Filled 2020-07-08: qty 1

## 2020-07-08 NOTE — Progress Notes (Signed)
PATIENT CARE CENTER NOTE  Diagnosis:Crohn's Disease   Provider:Schooler, Evette Doffing, MD   Procedure:IV Remicade   Note:Patient received Remicade infusion via PIV. Pre-medications given per order. Infusion titrated per protocol. Patient tolerated well with no adverse reaction. Vital signs remained stable. Discharge instructions given. Patient to come back in 8 weeks for next dose. Alert, oriented and ambulatory at discharge.

## 2020-07-08 NOTE — Discharge Instructions (Signed)
Infliximab injection What is this medicine? INFLIXIMAB (in [place] i mab) is used to treat Crohn's disease and ulcerative colitis. It is also used to treat ankylosing spondylitis, plaque psoriasis, and some forms of arthritis. This medicine may be used for other purposes; ask your health care provider or pharmacist if you have questions. COMMON BRAND NAME(S): AVSOLA, INFLECTRA, Remicade, RENFLEXIS What should I tell my health care provider before I take this medicine? They need to know if you have any of these conditions:  cancer  current or past resident of [place] or [place]  diabetes  exposure to tuberculosis  Guillain-Barre syndrome  heart failure  hepatitis or liver disease  immune system problems  infection  lung or breathing disease, like COPD  multiple sclerosis  receiving phototherapy for the skin  seizure disorder  an unusual or allergic reaction to infliximab, mouse proteins, other medicines, foods, dyes, or preservatives  pregnant or trying to get pregnant  breast-feeding How should I use this medicine? This medicine is for injection into a vein. It is usually given by a health care professional in a hospital or clinic setting. A special MedGuide will be given to you by the pharmacist with each prescription and refill. Be sure to read this information carefully each time. Talk to your pediatrician regarding the use of this medicine in children. While this drug may be prescribed for children as young as 33 years of age for selected conditions, precautions do apply. Overdosage: If you think you have taken too much of this medicine contact a poison control center or emergency room at once. NOTE: This medicine is only for you. Do not share this medicine with others. What if I miss a dose? It is important not to miss your dose. Call your doctor or health care professional if you are unable to keep an appointment. What may interact with this  medicine? Do not take this medicine with any of the following medications:  biologic medicines such as abatacept, adalimumab, anakinra, certolizumab, etanercept, golimumab, rituximab, secukinumab, tocilizumab, tofactinib, ustekinumab  live vaccines This list may not describe all possible interactions. Give your health care provider a list of all the medicines, herbs, non-prescription drugs, or dietary supplements you use. Also tell them if you smoke, drink alcohol, or use illegal drugs. Some items may interact with your medicine. What should I watch for while using this medicine? Your condition will be monitored carefully while you are receiving this medicine. Visit your doctor or health care professional for regular checks on your progress. You may need blood work done while you are taking this medicine. Before beginning therapy, your doctor may do a test to see if you have been exposed to tuberculosis. Call your doctor or health care professional for advice if you get a fever, chills or sore throat, or other symptoms of a cold or flu. Do not treat yourself. This drug decreases your body's ability to fight infections. Try to avoid being around people who are sick. This medicine may make the symptoms of heart failure worse in some patients. If you notice symptoms such as increased shortness of breath or swelling of the ankles or legs, contact your health care provider right away. If you are going to have surgery or dental work, tell your health care professional or dentist that you have received this medicine. If you take this medicine for plaque psoriasis, stay out of the sun. If you cannot avoid being in the sun, wear protective clothing and use sunscreen. Do  not use sun lamps or tanning beds/booths. Talk to your doctor about your risk of cancer. You may be more at risk for certain types of cancers if you take this medicine. What side effects may I notice from receiving this medicine? Side effects  that you should report to your doctor or health care professional as soon as possible:  allergic reactions like skin rash, itching or hives, swelling of the face, lips, or tongue  breathing problems  changes in vision  chest pain  fever or chills, usually related to the infusion  joint pain  pain, tingling, numbness in the hands or feet  redness, blistering, peeling or loosening of the skin, including inside the mouth  seizures  signs of infection - fever or chills, cough, sore throat, flu-like symptoms, pain or difficulty passing urine  signs and symptoms of liver injury like dark yellow or brown urine; general ill feeling; light-colored stools; loss of appetite; nausea; right upper belly pain; unusually weak or tired; yellowing of the eyes or skin  signs and symptoms of a stroke like changes in vision; confusion; trouble speaking or understanding; severe headaches; sudden numbness or weakness of the face, arm or leg; trouble walking; dizziness; loss of balance or coordination  swelling of the ankles, feet, or hands  swollen lymph nodes in the neck, underarm, or groin areas  unusual bleeding or bruising  unusually weak or tired Side effects that usually do not require medical attention (report to your doctor or health care professional if they continue or are bothersome):  headache  nausea  stomach pain  upset stomach This list may not describe all possible side effects. Call your doctor for medical advice about side effects. You may report side effects to FDA at 1-800-FDA-1088. Where should I keep my medicine? This drug is given in a hospital or clinic and will not be stored at home. NOTE: This sheet is a summary. It may not cover all possible information. If you have questions about this medicine, talk to your doctor, pharmacist, or health care provider.  2020 Elsevier/Gold Standard (2016-12-09 13:45:32)

## 2020-08-19 ENCOUNTER — Encounter (HOSPITAL_COMMUNITY): Payer: Medicare Other

## 2020-09-18 ENCOUNTER — Ambulatory Visit (HOSPITAL_COMMUNITY)
Admission: RE | Admit: 2020-09-18 | Discharge: 2020-09-18 | Disposition: A | Discharge status: Home / Self Care | Payer: Medicare Other | Source: Ambulatory Visit | Attending: Gastroenterology | Admitting: Gastroenterology

## 2020-09-18 ENCOUNTER — Other Ambulatory Visit: Payer: Self-pay

## 2020-09-18 DIAGNOSIS — K5010 Crohn's disease of large intestine without complications: Secondary | ICD-10-CM | POA: Diagnosis not present

## 2020-09-18 MED ORDER — METHYLPREDNISOLONE SODIUM SUCC 40 MG IJ SOLR
20.0000 mg | Freq: Once | INTRAMUSCULAR | Status: AC
  Administered 2020-09-18: 20 mg via INTRAVENOUS
  Filled 2020-09-18: qty 1

## 2020-09-18 MED ORDER — SODIUM CHLORIDE 0.9 % IV SOLN
INTRAVENOUS | Status: DC | PRN
  Administered 2020-09-18: 250 mL via INTRAVENOUS

## 2020-09-18 MED ORDER — SODIUM CHLORIDE 0.9 % IV SOLN
500.0000 mg | INTRAVENOUS | Status: DC
  Administered 2020-09-18: 500 mg via INTRAVENOUS
  Filled 2020-09-18: qty 50

## 2020-09-18 MED ORDER — ACETAMINOPHEN 325 MG PO TABS
650.0000 mg | ORAL_TABLET | Freq: Once | ORAL | Status: AC
  Administered 2020-09-18: 650 mg via ORAL
  Filled 2020-09-18: qty 2

## 2020-09-18 NOTE — Progress Notes (Signed)
PATIENT CARE CENTER NOTE  Diagnosis:Crohn's Disease   Provider:Schooler, Evette Doffing, MD   Procedure:IV Remicade   Note:Patient received Remicade infusion via PIV. Pre-medications given per order. Infusion titrated per protocol. Patient tolerated well with no adverse reaction. Vital signs remained stable. Discharge instructions given. Patient to come back in 8 weeks for next dose. Alert, oriented and ambulatory at discharge.

## 2020-09-18 NOTE — Discharge Instructions (Signed)
Infliximab injection What is this medicine? INFLIXIMAB (in [place] i mab) is used to treat Crohn's disease and ulcerative colitis. It is also used to treat ankylosing spondylitis, plaque psoriasis, and some forms of arthritis. This medicine may be used for other purposes; ask your health care provider or pharmacist if you have questions. COMMON BRAND NAME(S): AVSOLA, INFLECTRA, Remicade, RENFLEXIS What should I tell my health care provider before I take this medicine? They need to know if you have any of these conditions:  cancer  current or past resident of [place] or [place]  diabetes  exposure to tuberculosis  Guillain-Barre syndrome  heart failure  hepatitis or liver disease  immune system problems  infection  lung or breathing disease, like COPD  multiple sclerosis  receiving phototherapy for the skin  seizure disorder  an unusual or allergic reaction to infliximab, mouse proteins, other medicines, foods, dyes, or preservatives  pregnant or trying to get pregnant  breast-feeding How should I use this medicine? This medicine is for injection into a vein. It is usually given by a health care professional in a hospital or clinic setting. A special MedGuide will be given to you by the pharmacist with each prescription and refill. Be sure to read this information carefully each time. Talk to your pediatrician regarding the use of this medicine in children. While this drug may be prescribed for children as young as 27 years of age for selected conditions, precautions do apply. Overdosage: If you think you have taken too much of this medicine contact a poison control center or emergency room at once. NOTE: This medicine is only for you. Do not share this medicine with others. What if I miss a dose? It is important not to miss your dose. Call your doctor or health care professional if you are unable to keep an appointment. What may interact with this  medicine? Do not take this medicine with any of the following medications:  biologic medicines such as abatacept, adalimumab, anakinra, certolizumab, etanercept, golimumab, rituximab, secukinumab, tocilizumab, tofactinib, ustekinumab  live vaccines This list may not describe all possible interactions. Give your health care provider a list of all the medicines, herbs, non-prescription drugs, or dietary supplements you use. Also tell them if you smoke, drink alcohol, or use illegal drugs. Some items may interact with your medicine. What should I watch for while using this medicine? Your condition will be monitored carefully while you are receiving this medicine. Visit your doctor or health care professional for regular checks on your progress. You may need blood work done while you are taking this medicine. Before beginning therapy, your doctor may do a test to see if you have been exposed to tuberculosis. Call your doctor or health care professional for advice if you get a fever, chills or sore throat, or other symptoms of a cold or flu. Do not treat yourself. This drug decreases your body's ability to fight infections. Try to avoid being around people who are sick. This medicine may make the symptoms of heart failure worse in some patients. If you notice symptoms such as increased shortness of breath or swelling of the ankles or legs, contact your health care provider right away. If you are going to have surgery or dental work, tell your health care professional or dentist that you have received this medicine. If you take this medicine for plaque psoriasis, stay out of the sun. If you cannot avoid being in the sun, wear protective clothing and use sunscreen. Do  not use sun lamps or tanning beds/booths. Talk to your doctor about your risk of cancer. You may be more at risk for certain types of cancers if you take this medicine. What side effects may I notice from receiving this medicine? Side effects  that you should report to your doctor or health care professional as soon as possible:  allergic reactions like skin rash, itching or hives, swelling of the face, lips, or tongue  breathing problems  changes in vision  chest pain  fever or chills, usually related to the infusion  joint pain  pain, tingling, numbness in the hands or feet  redness, blistering, peeling or loosening of the skin, including inside the mouth  seizures  signs of infection - fever or chills, cough, sore throat, flu-like symptoms, pain or difficulty passing urine  signs and symptoms of liver injury like dark yellow or brown urine; general ill feeling; light-colored stools; loss of appetite; nausea; right upper belly pain; unusually weak or tired; yellowing of the eyes or skin  signs and symptoms of a stroke like changes in vision; confusion; trouble speaking or understanding; severe headaches; sudden numbness or weakness of the face, arm or leg; trouble walking; dizziness; loss of balance or coordination  swelling of the ankles, feet, or hands  swollen lymph nodes in the neck, underarm, or groin areas  unusual bleeding or bruising  unusually weak or tired Side effects that usually do not require medical attention (report to your doctor or health care professional if they continue or are bothersome):  headache  nausea  stomach pain  upset stomach This list may not describe all possible side effects. Call your doctor for medical advice about side effects. You may report side effects to FDA at 1-800-FDA-1088. Where should I keep my medicine? This drug is given in a hospital or clinic and will not be stored at home. NOTE: This sheet is a summary. It may not cover all possible information. If you have questions about this medicine, talk to your doctor, pharmacist, or health care provider.  2020 Elsevier/Gold Standard (2016-12-09 13:45:32)

## 2020-10-29 ENCOUNTER — Other Ambulatory Visit: Payer: Self-pay | Admitting: *Deleted

## 2020-10-29 ENCOUNTER — Inpatient Hospital Stay: Payer: Medicare Other | Attending: Oncology | Admitting: Oncology

## 2020-10-29 ENCOUNTER — Telehealth: Payer: Self-pay | Admitting: Oncology

## 2020-10-29 ENCOUNTER — Inpatient Hospital Stay: Payer: Medicare Other

## 2020-10-29 ENCOUNTER — Other Ambulatory Visit: Payer: Self-pay

## 2020-10-29 VITALS — BP 137/73 | HR 85 | Temp 97.3°F | Resp 20 | Ht 64.0 in | Wt 201.2 lb

## 2020-10-29 DIAGNOSIS — K5090 Crohn's disease, unspecified, without complications: Secondary | ICD-10-CM | POA: Diagnosis not present

## 2020-10-29 DIAGNOSIS — D75839 Thrombocytosis, unspecified: Secondary | ICD-10-CM | POA: Diagnosis not present

## 2020-10-29 DIAGNOSIS — K922 Gastrointestinal hemorrhage, unspecified: Secondary | ICD-10-CM | POA: Insufficient documentation

## 2020-10-29 DIAGNOSIS — R634 Abnormal weight loss: Secondary | ICD-10-CM | POA: Insufficient documentation

## 2020-10-29 DIAGNOSIS — C8519 Unspecified B-cell lymphoma, extranodal and solid organ sites: Secondary | ICD-10-CM

## 2020-10-29 DIAGNOSIS — Z23 Encounter for immunization: Secondary | ICD-10-CM | POA: Insufficient documentation

## 2020-10-29 DIAGNOSIS — Z8572 Personal history of non-Hodgkin lymphomas: Secondary | ICD-10-CM | POA: Diagnosis present

## 2020-10-29 DIAGNOSIS — J45909 Unspecified asthma, uncomplicated: Secondary | ICD-10-CM | POA: Insufficient documentation

## 2020-10-29 DIAGNOSIS — D509 Iron deficiency anemia, unspecified: Secondary | ICD-10-CM | POA: Insufficient documentation

## 2020-10-29 DIAGNOSIS — R630 Anorexia: Secondary | ICD-10-CM | POA: Insufficient documentation

## 2020-10-29 LAB — CBC WITH DIFFERENTIAL (CANCER CENTER ONLY)
Abs Immature Granulocytes: 0.01 10*3/uL (ref 0.00–0.07)
Basophils Absolute: 0.1 10*3/uL (ref 0.0–0.1)
Basophils Relative: 1 %
Eosinophils Absolute: 0.4 10*3/uL (ref 0.0–0.5)
Eosinophils Relative: 5 %
HCT: 42.3 % (ref 36.0–46.0)
Hemoglobin: 13.8 g/dL (ref 12.0–15.0)
Immature Granulocytes: 0 %
Lymphocytes Relative: 33 %
Lymphs Abs: 2.5 10*3/uL (ref 0.7–4.0)
MCH: 33.2 pg (ref 26.0–34.0)
MCHC: 32.6 g/dL (ref 30.0–36.0)
MCV: 101.7 fL — ABNORMAL HIGH (ref 80.0–100.0)
Monocytes Absolute: 0.6 10*3/uL (ref 0.1–1.0)
Monocytes Relative: 7 %
Neutro Abs: 4 10*3/uL (ref 1.7–7.7)
Neutrophils Relative %: 54 %
Platelet Count: 271 10*3/uL (ref 150–400)
RBC: 4.16 MIL/uL (ref 3.87–5.11)
RDW: 13.6 % (ref 11.5–15.5)
WBC Count: 7.5 10*3/uL (ref 4.0–10.5)
nRBC: 0 % (ref 0.0–0.2)

## 2020-10-29 LAB — BASIC METABOLIC PANEL - CANCER CENTER ONLY
Anion gap: 10 (ref 5–15)
BUN: 16 mg/dL (ref 8–23)
CO2: 31 mmol/L (ref 22–32)
Calcium: 10.1 mg/dL (ref 8.9–10.3)
Chloride: 98 mmol/L (ref 98–111)
Creatinine: 0.95 mg/dL (ref 0.44–1.00)
GFR, Estimated: >60 mL/min (ref >60)
Glucose, Bld: 130 mg/dL — ABNORMAL HIGH (ref 70–99)
Potassium: 4 mmol/L (ref 3.5–5.1)
Sodium: 139 mmol/L (ref 135–145)

## 2020-10-29 MED ORDER — INFLUENZA VAC A&B SA ADJ QUAD 0.5 ML IM PRSY
PREFILLED_SYRINGE | INTRAMUSCULAR | Status: AC
  Filled 2020-10-29: qty 0.5

## 2020-10-29 MED ORDER — INFLUENZA VAC A&B SA ADJ QUAD 0.5 ML IM PRSY
0.5000 mL | PREFILLED_SYRINGE | Freq: Once | INTRAMUSCULAR | Status: AC
  Administered 2020-10-29: 0.5 mL via INTRAMUSCULAR

## 2020-10-29 NOTE — Progress Notes (Signed)
Ordered B12 level and sent message to lab to see if can be run from sample collected today.

## 2020-10-29 NOTE — Progress Notes (Signed)
  [facility] OFFICE PROGRESS NOTE   Diagnosis: Non-Hodgkin's lymphoma, serum monoclonal protein  INTERVAL HISTORY:   [name] returns as scheduled.  She feels well.  No fever or night sweats.  She continues every week Remicade.  No palpable lymph nodes.  She relates weight loss to early satiety.  No abdominal pain, dysphagia or nausea.  Objective:  Vital signs in last 24 hours:  Blood pressure 137/73, pulse 85, temperature (!) 97.3 F (36.3 C), temperature source Tympanic, resp. rate 20, height 5' 4"  (1.626 m), weight 201 lb 3.2 oz (91.3 kg), SpO2 95 %.    Lymphatics: Soft mobile area over the left clavicle without a discrete lymph node.  No palpable cervical, supraclavicular, axillary, or inguinal nodes Resp: Scattered end inspiratory rhonchi at the posterior chest bilaterally, no respiratory distress Cardio: Regular rate and rhythm GI: No hepatosplenomegaly, no mass Vascular: No leg edema  Skin: Inclusion cyst in the left axilla, less than 1 cm flat area of blue discoloration of the right upper back, inclusion cyst at the left upper back  Portacath/PICC-without erythema  Lab Results:  Lab Results  Component Value Date   WBC 7.5 10/29/2020   HGB 13.8 10/29/2020   HCT 42.3 10/29/2020   MCV 101.7 (H) 10/29/2020   PLT 271 10/29/2020   NEUTROABS 4.0 10/29/2020    CMP  Lab Results  Component Value Date   NA 139 10/29/2020   K 4.0 10/29/2020   CL 98 10/29/2020   CO2 31 10/29/2020   GLUCOSE 130 (H) 10/29/2020   BUN 16 10/29/2020   CREATININE 0.95 10/29/2020   CALCIUM 10.1 10/29/2020   PROT 7.2 10/23/2019   ALBUMIN 3.7 10/23/2019   AST 40 10/23/2019   ALT 43 10/23/2019   ALKPHOS 57 10/23/2019   BILITOT 1.0 10/23/2019   GFRNONAA >60 10/29/2020   GFRAA >60 10/30/2019      Medications: I have reviewed the patient's current medications.   Assessment/Plan: 1. Non-Hodgkin lymphoma, low grade lymphoma involving the lungs, diagnosed in June 2006.  No clinical evidence of progression. 2. Crohn's disease, followed by Dr. Wynetta Emery.She continues Remicade 3. Post thoracotomy pain, persistent, initially improved with Elavil, now taking gabapentin 4. Anemia secondary to chronic disease and gastrointestinal bleeding. Hemoglobin is improved. 5. History of thrombocytosis. 6. History of anorexia/weight loss in the setting of active Crohn disease. Improved. 7. History of a serum monoclonal protein, stable on repeat serum protein electrophoresis studies, last in December 2019 8. History of asthma. .   Disposition: [name] appears unchanged.  There is no clinical evidence for progression of the non-Hodgkin's lymphoma or development of another lymphoproliferative disorder.  We will follow up on the serum protein electrophoresis from today.  She will return for an office and lab visit in 1 year.  She will asked Dr. [name] to check the hyperpigmented area at the right upper back when she sees him next month.  This is likely benign finding.  [name] received an influenza vaccine today.  She will return for a COVID-19 booster vaccine in 2 weeks.  [name], MD  10/29/2020  12:52 PM

## 2020-10-29 NOTE — Telephone Encounter (Signed)
Scheduled per los. Gave avs and calendar  

## 2020-10-30 LAB — PROTEIN ELECTROPHORESIS, SERUM
A/G Ratio: 0.9 (ref 0.7–1.7)
Albumin ELP: 3.7 g/dL (ref 2.9–4.4)
Alpha-1-Globulin: 0.2 g/dL (ref 0.0–0.4)
Alpha-2-Globulin: 0.8 g/dL (ref 0.4–1.0)
Beta Globulin: 1.3 g/dL (ref 0.7–1.3)
Gamma Globulin: 1.7 g/dL (ref 0.4–1.8)
Globulin, Total: 3.9 g/dL (ref 2.2–3.9)
M-Spike, %: 0.8 g/dL — ABNORMAL HIGH
Total Protein ELP: 7.6 g/dL (ref 6.0–8.5)

## 2020-11-11 ENCOUNTER — Telehealth: Payer: Self-pay | Admitting: *Deleted

## 2020-11-11 ENCOUNTER — Inpatient Hospital Stay: Payer: Medicare Other

## 2020-11-11 ENCOUNTER — Other Ambulatory Visit: Payer: Self-pay

## 2020-11-11 ENCOUNTER — Other Ambulatory Visit: Payer: Self-pay | Admitting: *Deleted

## 2020-11-11 DIAGNOSIS — D75839 Thrombocytosis, unspecified: Secondary | ICD-10-CM | POA: Diagnosis not present

## 2020-11-11 DIAGNOSIS — Z8572 Personal history of non-Hodgkin lymphomas: Secondary | ICD-10-CM | POA: Diagnosis present

## 2020-11-11 DIAGNOSIS — R634 Abnormal weight loss: Secondary | ICD-10-CM | POA: Diagnosis not present

## 2020-11-11 DIAGNOSIS — R630 Anorexia: Secondary | ICD-10-CM | POA: Diagnosis not present

## 2020-11-11 DIAGNOSIS — K922 Gastrointestinal hemorrhage, unspecified: Secondary | ICD-10-CM | POA: Diagnosis not present

## 2020-11-11 DIAGNOSIS — J45909 Unspecified asthma, uncomplicated: Secondary | ICD-10-CM | POA: Diagnosis not present

## 2020-11-11 DIAGNOSIS — K5090 Crohn's disease, unspecified, without complications: Secondary | ICD-10-CM | POA: Diagnosis not present

## 2020-11-11 DIAGNOSIS — Z23 Encounter for immunization: Secondary | ICD-10-CM

## 2020-11-11 DIAGNOSIS — C8519 Unspecified B-cell lymphoma, extranodal and solid organ sites: Secondary | ICD-10-CM

## 2020-11-11 DIAGNOSIS — D509 Iron deficiency anemia, unspecified: Secondary | ICD-10-CM | POA: Diagnosis not present

## 2020-11-11 LAB — CMP (CANCER CENTER ONLY)
ALT: 34 U/L (ref 0–44)
AST: 25 U/L (ref 15–41)
Albumin: 3.6 g/dL (ref 3.5–5.0)
Alkaline Phosphatase: 69 U/L (ref 38–126)
Anion gap: 10 (ref 5–15)
BUN: 16 mg/dL (ref 8–23)
CO2: 28 mmol/L (ref 22–32)
Calcium: 9.9 mg/dL (ref 8.9–10.3)
Chloride: 106 mmol/L (ref 98–111)
Creatinine: 0.95 mg/dL (ref 0.44–1.00)
GFR, Estimated: >60 mL/min (ref >60)
Glucose, Bld: 179 mg/dL — ABNORMAL HIGH (ref 70–99)
Potassium: 3.7 mmol/L (ref 3.5–5.1)
Sodium: 144 mmol/L (ref 135–145)
Total Bilirubin: 0.5 mg/dL (ref 0.3–1.2)
Total Protein: 8.1 g/dL (ref 6.5–8.1)

## 2020-11-11 LAB — CBC WITH DIFFERENTIAL (CANCER CENTER ONLY)
Abs Immature Granulocytes: 0.02 10*3/uL (ref 0.00–0.07)
Basophils Absolute: 0.1 10*3/uL (ref 0.0–0.1)
Basophils Relative: 1 %
Eosinophils Absolute: 0.4 10*3/uL (ref 0.0–0.5)
Eosinophils Relative: 5 %
HCT: 41.1 % (ref 36.0–46.0)
Hemoglobin: 13.6 g/dL (ref 12.0–15.0)
Immature Granulocytes: 0 %
Lymphocytes Relative: 30 %
Lymphs Abs: 2.6 10*3/uL (ref 0.7–4.0)
MCH: 33 pg (ref 26.0–34.0)
MCHC: 33.1 g/dL (ref 30.0–36.0)
MCV: 99.8 fL (ref 80.0–100.0)
Monocytes Absolute: 0.6 10*3/uL (ref 0.1–1.0)
Monocytes Relative: 7 %
Neutro Abs: 4.8 10*3/uL (ref 1.7–7.7)
Neutrophils Relative %: 57 %
Platelet Count: 265 10*3/uL (ref 150–400)
RBC: 4.12 MIL/uL (ref 3.87–5.11)
RDW: 13.7 % (ref 11.5–15.5)
WBC Count: 8.4 10*3/uL (ref 4.0–10.5)
nRBC: 0 % (ref 0.0–0.2)

## 2020-11-11 LAB — VITAMIN B12: Vitamin B-12: 806 pg/mL (ref 180–914)

## 2020-11-11 NOTE — Progress Notes (Signed)
   Covid-19 Vaccination Clinic  Name:  [name]    MRN: [id number] DOB: 14-Jun-1951  11/11/2020  [name] was observed post Covid-19 immunization for 15 minutes without incident. She was provided with Vaccine Information Sheet and instruction to access the V-Safe system.   [name] was instructed to call 911 with any severe reactions post vaccine: [name] Difficulty breathing  . Swelling of face and throat  . A fast heartbeat  . A bad rash all over body  . Dizziness and weakness   Immunizations Administered    Name Date Dose VIS Date Route   Pfizer COVID-19 Vaccine 11/11/2020 11:08 AM 0.3 mL 09/11/2020 Intramuscular   Manufacturer: [place]   Lot: [id number]   [place]: [id number]

## 2020-11-11 NOTE — Progress Notes (Signed)
Labs for 10/2021 were released today in error. Called lab to cancel CBC, CMP and SPEP today and credit patient. New orders placed for December 2022.

## 2020-11-11 NOTE — Telephone Encounter (Signed)
Notified of normal B12 results.

## 2020-11-13 ENCOUNTER — Other Ambulatory Visit: Payer: Self-pay

## 2020-11-13 ENCOUNTER — Non-Acute Institutional Stay (HOSPITAL_COMMUNITY)
Admission: RE | Admit: 2020-11-13 | Discharge: 2020-11-13 | Disposition: A | Discharge status: Home / Self Care | Payer: Medicare Other | Source: Ambulatory Visit | Attending: Internal Medicine | Admitting: Internal Medicine

## 2020-11-13 DIAGNOSIS — K5090 Crohn's disease, unspecified, without complications: Secondary | ICD-10-CM | POA: Insufficient documentation

## 2020-11-13 LAB — IMMUNOFIXATION ELECTROPHORESIS
IgA: 402 mg/dL (ref 64–422)
IgG (Immunoglobin G), Serum: 1539 mg/dL (ref 586–1602)
IgM (Immunoglobulin M), Srm: 93 mg/dL (ref 26–217)
Total Protein ELP: 7.3 g/dL (ref 6.0–8.5)

## 2020-11-13 MED ORDER — METHYLPREDNISOLONE SODIUM SUCC 40 MG IJ SOLR
20.0000 mg | Freq: Once | INTRAMUSCULAR | Status: AC
  Administered 2020-11-13: 10:00:00 20 mg via INTRAVENOUS
  Filled 2020-11-13: qty 1

## 2020-11-13 MED ORDER — SODIUM CHLORIDE 0.9 % IV SOLN
INTRAVENOUS | Status: DC | PRN
  Administered 2020-11-13: 10:00:00 250 mL via INTRAVENOUS

## 2020-11-13 MED ORDER — SODIUM CHLORIDE 0.9 % IV SOLN
400.0000 mg | INTRAVENOUS | Status: DC
  Administered 2020-11-13: 11:00:00 400 mg via INTRAVENOUS
  Filled 2020-11-13: qty 40

## 2020-11-13 MED ORDER — ACETAMINOPHEN 325 MG PO TABS
650.0000 mg | ORAL_TABLET | Freq: Four times a day (QID) | ORAL | Status: DC | PRN
  Administered 2020-11-13: 10:00:00 650 mg via ORAL
  Filled 2020-11-13: qty 2

## 2020-11-13 NOTE — Progress Notes (Signed)
PATIENT CARE CENTER NOTE  Diagnosis:Crohn's Disease   Provider:Schooler, Evette Doffing, MD   Procedure:IV Remicade   Note:Patient received Remicade infusion via PIV. Pre-medications given per order. Infusion titrated per protocol. Patient tolerated well with no adverse reaction. Vital signs remained stable. Discharge instructions given. Patient to come back in 8 weeks for next dose. Alert, oriented and ambulatory at discharge.

## 2020-11-13 NOTE — Discharge Instructions (Signed)
Infliximab injection What is this medicine? INFLIXIMAB (in [place] i mab) is used to treat Crohn's disease and ulcerative colitis. It is also used to treat ankylosing spondylitis, plaque psoriasis, and some forms of arthritis. This medicine may be used for other purposes; ask your health care provider or pharmacist if you have questions. COMMON BRAND NAME(S): AVSOLA, INFLECTRA, Remicade, RENFLEXIS What should I tell my health care provider before I take this medicine? They need to know if you have any of these conditions:  cancer  current or past resident of [place] or [place]  diabetes  exposure to tuberculosis  Guillain-Barre syndrome  heart failure  hepatitis or liver disease  immune system problems  infection  lung or breathing disease, like COPD  multiple sclerosis  receiving phototherapy for the skin  seizure disorder  an unusual or allergic reaction to infliximab, mouse proteins, other medicines, foods, dyes, or preservatives  pregnant or trying to get pregnant  breast-feeding How should I use this medicine? This medicine is for injection into a vein. It is usually given by a health care professional in a hospital or clinic setting. A special MedGuide will be given to you by the pharmacist with each prescription and refill. Be sure to read this information carefully each time. Talk to your pediatrician regarding the use of this medicine in children. While this drug may be prescribed for children as young as 85 years of age for selected conditions, precautions do apply. Overdosage: If you think you have taken too much of this medicine contact a poison control center or emergency room at once. NOTE: This medicine is only for you. Do not share this medicine with others. What if I miss a dose? It is important not to miss your dose. Call your doctor or health care professional if you are unable to keep an appointment. What may interact with this  medicine? Do not take this medicine with any of the following medications:  biologic medicines such as abatacept, adalimumab, anakinra, certolizumab, etanercept, golimumab, rituximab, secukinumab, tocilizumab, tofactinib, ustekinumab  live vaccines This list may not describe all possible interactions. Give your health care provider a list of all the medicines, herbs, non-prescription drugs, or dietary supplements you use. Also tell them if you smoke, drink alcohol, or use illegal drugs. Some items may interact with your medicine. What should I watch for while using this medicine? Your condition will be monitored carefully while you are receiving this medicine. Visit your doctor or health care professional for regular checks on your progress. You may need blood work done while you are taking this medicine. Before beginning therapy, your doctor may do a test to see if you have been exposed to tuberculosis. Call your doctor or health care professional for advice if you get a fever, chills or sore throat, or other symptoms of a cold or flu. Do not treat yourself. This drug decreases your body's ability to fight infections. Try to avoid being around people who are sick. This medicine may make the symptoms of heart failure worse in some patients. If you notice symptoms such as increased shortness of breath or swelling of the ankles or legs, contact your health care provider right away. If you are going to have surgery or dental work, tell your health care professional or dentist that you have received this medicine. If you take this medicine for plaque psoriasis, stay out of the sun. If you cannot avoid being in the sun, wear protective clothing and use sunscreen. Do  not use sun lamps or tanning beds/booths. Talk to your doctor about your risk of cancer. You may be more at risk for certain types of cancers if you take this medicine. What side effects may I notice from receiving this medicine? Side effects  that you should report to your doctor or health care professional as soon as possible:  allergic reactions like skin rash, itching or hives, swelling of the face, lips, or tongue  breathing problems  changes in vision  chest pain  fever or chills, usually related to the infusion  joint pain  pain, tingling, numbness in the hands or feet  redness, blistering, peeling or loosening of the skin, including inside the mouth  seizures  signs of infection - fever or chills, cough, sore throat, flu-like symptoms, pain or difficulty passing urine  signs and symptoms of liver injury like dark yellow or brown urine; general ill feeling; light-colored stools; loss of appetite; nausea; right upper belly pain; unusually weak or tired; yellowing of the eyes or skin  signs and symptoms of a stroke like changes in vision; confusion; trouble speaking or understanding; severe headaches; sudden numbness or weakness of the face, arm or leg; trouble walking; dizziness; loss of balance or coordination  swelling of the ankles, feet, or hands  swollen lymph nodes in the neck, underarm, or groin areas  unusual bleeding or bruising  unusually weak or tired Side effects that usually do not require medical attention (report to your doctor or health care professional if they continue or are bothersome):  headache  nausea  stomach pain  upset stomach This list may not describe all possible side effects. Call your doctor for medical advice about side effects. You may report side effects to FDA at 1-800-FDA-1088. Where should I keep my medicine? This drug is given in a hospital or clinic and will not be stored at home. NOTE: This sheet is a summary. It may not cover all possible information. If you have questions about this medicine, talk to your doctor, pharmacist, or health care provider.  2020 Elsevier/Gold Standard (2016-12-09 13:45:32)

## 2020-12-20 DIAGNOSIS — J309 Allergic rhinitis, unspecified: Secondary | ICD-10-CM | POA: Diagnosis not present

## 2020-12-20 DIAGNOSIS — E559 Vitamin D deficiency, unspecified: Secondary | ICD-10-CM | POA: Diagnosis not present

## 2020-12-20 DIAGNOSIS — E1140 Type 2 diabetes mellitus with diabetic neuropathy, unspecified: Secondary | ICD-10-CM | POA: Diagnosis not present

## 2020-12-20 DIAGNOSIS — E7800 Pure hypercholesterolemia, unspecified: Secondary | ICD-10-CM | POA: Diagnosis not present

## 2020-12-20 DIAGNOSIS — E1165 Type 2 diabetes mellitus with hyperglycemia: Secondary | ICD-10-CM | POA: Diagnosis not present

## 2020-12-20 DIAGNOSIS — C8590 Non-Hodgkin lymphoma, unspecified, unspecified site: Secondary | ICD-10-CM | POA: Diagnosis not present

## 2020-12-20 DIAGNOSIS — G4730 Sleep apnea, unspecified: Secondary | ICD-10-CM | POA: Diagnosis not present

## 2020-12-20 DIAGNOSIS — I10 Essential (primary) hypertension: Secondary | ICD-10-CM | POA: Diagnosis not present

## 2020-12-20 DIAGNOSIS — Z79899 Other long term (current) drug therapy: Secondary | ICD-10-CM | POA: Diagnosis not present

## 2020-12-20 DIAGNOSIS — Z0000 Encounter for general adult medical examination without abnormal findings: Secondary | ICD-10-CM | POA: Diagnosis not present

## 2020-12-20 DIAGNOSIS — J4520 Mild intermittent asthma, uncomplicated: Secondary | ICD-10-CM | POA: Diagnosis not present

## 2021-01-09 DIAGNOSIS — J4520 Mild intermittent asthma, uncomplicated: Secondary | ICD-10-CM | POA: Diagnosis not present

## 2021-01-09 DIAGNOSIS — I10 Essential (primary) hypertension: Secondary | ICD-10-CM | POA: Diagnosis not present

## 2021-01-09 DIAGNOSIS — J4550 Severe persistent asthma, uncomplicated: Secondary | ICD-10-CM | POA: Diagnosis not present

## 2021-01-09 DIAGNOSIS — E1140 Type 2 diabetes mellitus with diabetic neuropathy, unspecified: Secondary | ICD-10-CM | POA: Diagnosis not present

## 2021-01-09 DIAGNOSIS — E1165 Type 2 diabetes mellitus with hyperglycemia: Secondary | ICD-10-CM | POA: Diagnosis not present

## 2021-01-09 DIAGNOSIS — E7800 Pure hypercholesterolemia, unspecified: Secondary | ICD-10-CM | POA: Diagnosis not present

## 2021-01-21 ENCOUNTER — Other Ambulatory Visit: Payer: Self-pay

## 2021-01-21 ENCOUNTER — Non-Acute Institutional Stay (HOSPITAL_COMMUNITY)
Admission: RE | Admit: 2021-01-21 | Discharge: 2021-01-21 | Disposition: A | Discharge status: Home / Self Care | Payer: Medicare Other | Source: Ambulatory Visit | Attending: Internal Medicine | Admitting: Internal Medicine

## 2021-01-21 DIAGNOSIS — K5010 Crohn's disease of large intestine without complications: Secondary | ICD-10-CM | POA: Insufficient documentation

## 2021-01-21 MED ORDER — SODIUM CHLORIDE 0.9 % IV SOLN
400.0000 mg | INTRAVENOUS | Status: DC
  Administered 2021-01-21: 400 mg via INTRAVENOUS
  Filled 2021-01-21: qty 40

## 2021-01-21 MED ORDER — SODIUM CHLORIDE 0.9 % IV SOLN
INTRAVENOUS | Status: DC | PRN
  Administered 2021-01-21: 250 mL via INTRAVENOUS

## 2021-01-21 MED ORDER — ACETAMINOPHEN 325 MG PO TABS
650.0000 mg | ORAL_TABLET | Freq: Once | ORAL | Status: AC
  Administered 2021-01-21: 650 mg via ORAL
  Filled 2021-01-21: qty 2

## 2021-01-21 MED ORDER — METHYLPREDNISOLONE SODIUM SUCC 40 MG IJ SOLR
20.0000 mg | Freq: Once | INTRAMUSCULAR | Status: AC
  Administered 2021-01-21: 20 mg via INTRAVENOUS
  Filled 2021-01-21: qty 1

## 2021-01-21 NOTE — Progress Notes (Signed)
Patient received IV Remicade as ordered by Wilford Corner MD. Pre-medications were given per order. Infusion titrated per protocol.Tolerated well, vitals stable, discharge instructions given, verbalized understanding. Patient declined post infusion 60 minutes observation. Alert, oriented and ambulatory at the time of discharge.

## 2021-01-21 NOTE — Discharge Instructions (Signed)
Infliximab injection What is this medicine? INFLIXIMAB (in [place] i mab) is used to treat Crohn's disease and ulcerative colitis. It is also used to treat ankylosing spondylitis, plaque psoriasis, and some forms of arthritis. This medicine may be used for other purposes; ask your health care provider or pharmacist if you have questions. COMMON BRAND NAME(S): AVSOLA, INFLECTRA, IXIFI, Remicade, RENFLEXIS What should I tell my health care provider before I take this medicine? They need to know if you have any of these conditions:  cancer  current or past resident of [place] or [place]  diabetes  exposure to tuberculosis  Guillain-Barre syndrome  heart failure  hepatitis or liver disease  immune system problems  infection  lung or breathing disease, like COPD  multiple sclerosis  receiving phototherapy for the skin  seizure disorder  an unusual or allergic reaction to infliximab, mouse proteins, other medicines, foods, dyes, or preservatives  pregnant or trying to get pregnant  breast-feeding How should I use this medicine? This medicine is for injection into a vein. It is usually given by a health care professional in a hospital or clinic setting. A special MedGuide will be given to you by the pharmacist with each prescription and refill. Be sure to read this information carefully each time. Talk to your pediatrician regarding the use of this medicine in children. While this drug may be prescribed for children as young as 60 years of age for selected conditions, precautions do apply. Overdosage: If you think you have taken too much of this medicine contact a poison control center or emergency room at once. NOTE: This medicine is only for you. Do not share this medicine with others. What if I miss a dose? It is important not to miss your dose. Call your doctor or health care professional if you are unable to keep an appointment. What may interact with this  medicine? Do not take this medicine with any of the following medications:  biologic medicines such as abatacept, adalimumab, anakinra, certolizumab, etanercept, golimumab, rituximab, secukinumab, tocilizumab, tofactinib, ustekinumab  live vaccines This list may not describe all possible interactions. Give your health care provider a list of all the medicines, herbs, non-prescription drugs, or dietary supplements you use. Also tell them if you smoke, drink alcohol, or use illegal drugs. Some items may interact with your medicine. What should I watch for while using this medicine? Your condition will be monitored carefully while you are receiving this medicine. Visit your doctor or health care professional for regular checks on your progress. You may need blood work done while you are taking this medicine. Before beginning therapy, your doctor may do a test to see if you have been exposed to tuberculosis. Call your doctor or health care professional for advice if you get a fever, chills or sore throat, or other symptoms of a cold or flu. Do not treat yourself. This drug decreases your body's ability to fight infections. Try to avoid being around people who are sick. This medicine may make the symptoms of heart failure worse in some patients. If you notice symptoms such as increased shortness of breath or swelling of the ankles or legs, contact your health care provider right away. If you are going to have surgery or dental work, tell your health care professional or dentist that you have received this medicine. If you take this medicine for plaque psoriasis, stay out of the sun. If you cannot avoid being in the sun, wear protective clothing and use sunscreen.  Do not use sun lamps or tanning beds/booths. Talk to your doctor about your risk of cancer. You may be more at risk for certain types of cancers if you take this medicine. What side effects may I notice from receiving this medicine? Side effects  that you should report to your doctor or health care professional as soon as possible:  allergic reactions like skin rash, itching or hives, swelling of the face, lips, or tongue  breathing problems  changes in vision  chest pain  fever or chills, usually related to the infusion  joint pain  pain, tingling, numbness in the hands or feet  redness, blistering, peeling or loosening of the skin, including inside the mouth  seizures  signs of infection - fever or chills, cough, sore throat, flu-like symptoms, pain or difficulty passing urine  signs and symptoms of liver injury like dark yellow or brown urine; general ill feeling; light-colored stools; loss of appetite; nausea; right upper belly pain; unusually weak or tired; yellowing of the eyes or skin  signs and symptoms of a stroke like changes in vision; confusion; trouble speaking or understanding; severe headaches; sudden numbness or weakness of the face, arm or leg; trouble walking; dizziness; loss of balance or coordination  swelling of the ankles, feet, or hands  swollen lymph nodes in the neck, underarm, or groin areas  unusual bleeding or bruising  unusually weak or tired Side effects that usually do not require medical attention (report to your doctor or health care professional if they continue or are bothersome):  headache  nausea  stomach pain  upset stomach This list may not describe all possible side effects. Call your doctor for medical advice about side effects. You may report side effects to FDA at 1-800-FDA-1088. Where should I keep my medicine? This drug is given in a hospital or clinic and will not be stored at home. NOTE: This sheet is a summary. It may not cover all possible information. If you have questions about this medicine, talk to your doctor, pharmacist, or health care provider.  2021 Elsevier/Gold Standard (2016-12-09 13:45:32)

## 2021-02-06 ENCOUNTER — Other Ambulatory Visit: Payer: Self-pay | Admitting: Obstetrics & Gynecology

## 2021-02-06 DIAGNOSIS — Z1231 Encounter for screening mammogram for malignant neoplasm of breast: Secondary | ICD-10-CM

## 2021-02-20 DIAGNOSIS — I10 Essential (primary) hypertension: Secondary | ICD-10-CM | POA: Diagnosis not present

## 2021-02-20 DIAGNOSIS — E1165 Type 2 diabetes mellitus with hyperglycemia: Secondary | ICD-10-CM | POA: Diagnosis not present

## 2021-02-20 DIAGNOSIS — E119 Type 2 diabetes mellitus without complications: Secondary | ICD-10-CM | POA: Diagnosis not present

## 2021-02-20 DIAGNOSIS — E1140 Type 2 diabetes mellitus with diabetic neuropathy, unspecified: Secondary | ICD-10-CM | POA: Diagnosis not present

## 2021-02-20 DIAGNOSIS — J4520 Mild intermittent asthma, uncomplicated: Secondary | ICD-10-CM | POA: Diagnosis not present

## 2021-02-20 DIAGNOSIS — E7800 Pure hypercholesterolemia, unspecified: Secondary | ICD-10-CM | POA: Diagnosis not present

## 2021-02-20 DIAGNOSIS — J4550 Severe persistent asthma, uncomplicated: Secondary | ICD-10-CM | POA: Diagnosis not present

## 2021-03-18 ENCOUNTER — Other Ambulatory Visit: Payer: Self-pay

## 2021-03-18 ENCOUNTER — Non-Acute Institutional Stay (HOSPITAL_COMMUNITY)
Admission: RE | Admit: 2021-03-18 | Discharge: 2021-03-18 | Disposition: A | Discharge status: Home / Self Care | Payer: Medicare Other | Source: Ambulatory Visit | Attending: Internal Medicine | Admitting: Internal Medicine

## 2021-03-18 DIAGNOSIS — K5010 Crohn's disease of large intestine without complications: Secondary | ICD-10-CM | POA: Diagnosis not present

## 2021-03-18 MED ORDER — METHYLPREDNISOLONE SODIUM SUCC 40 MG IJ SOLR
20.0000 mg | Freq: Once | INTRAMUSCULAR | Status: AC
  Administered 2021-03-18: 20 mg via INTRAVENOUS
  Filled 2021-03-18: qty 1

## 2021-03-18 MED ORDER — SODIUM CHLORIDE 0.9 % IV SOLN
400.0000 mg | INTRAVENOUS | Status: DC
  Administered 2021-03-18: 400 mg via INTRAVENOUS
  Filled 2021-03-18: qty 40

## 2021-03-18 MED ORDER — SODIUM CHLORIDE 0.9 % IV SOLN
INTRAVENOUS | Status: DC | PRN
  Administered 2021-03-18: 250 mL via INTRAVENOUS

## 2021-03-18 MED ORDER — ACETAMINOPHEN 325 MG PO TABS
650.0000 mg | ORAL_TABLET | Freq: Once | ORAL | Status: AC
  Administered 2021-03-18: 650 mg via ORAL
  Filled 2021-03-18: qty 2

## 2021-03-18 NOTE — Progress Notes (Signed)
Patient received IVRemicade as ordered by Wilford Corner MD.Pre-medications were given per order. Infusion titrated per protocol.Tolerated well, vitals stable, discharge instructions given, verbalized understanding.Patient declined post infusion 60 minutes observation. Alert, oriented and ambulatory at the time of discharge.

## 2021-03-19 DIAGNOSIS — J4550 Severe persistent asthma, uncomplicated: Secondary | ICD-10-CM | POA: Diagnosis not present

## 2021-03-19 DIAGNOSIS — E7800 Pure hypercholesterolemia, unspecified: Secondary | ICD-10-CM | POA: Diagnosis not present

## 2021-03-19 DIAGNOSIS — E1140 Type 2 diabetes mellitus with diabetic neuropathy, unspecified: Secondary | ICD-10-CM | POA: Diagnosis not present

## 2021-03-19 DIAGNOSIS — E1165 Type 2 diabetes mellitus with hyperglycemia: Secondary | ICD-10-CM | POA: Diagnosis not present

## 2021-03-19 DIAGNOSIS — E119 Type 2 diabetes mellitus without complications: Secondary | ICD-10-CM | POA: Diagnosis not present

## 2021-03-19 DIAGNOSIS — K5010 Crohn's disease of large intestine without complications: Secondary | ICD-10-CM | POA: Diagnosis not present

## 2021-03-19 DIAGNOSIS — J4520 Mild intermittent asthma, uncomplicated: Secondary | ICD-10-CM | POA: Diagnosis not present

## 2021-03-19 DIAGNOSIS — I10 Essential (primary) hypertension: Secondary | ICD-10-CM | POA: Diagnosis not present

## 2021-03-26 DIAGNOSIS — Z1231 Encounter for screening mammogram for malignant neoplasm of breast: Secondary | ICD-10-CM

## 2021-04-18 DIAGNOSIS — E1165 Type 2 diabetes mellitus with hyperglycemia: Secondary | ICD-10-CM | POA: Diagnosis not present

## 2021-04-18 DIAGNOSIS — I10 Essential (primary) hypertension: Secondary | ICD-10-CM | POA: Diagnosis not present

## 2021-04-18 DIAGNOSIS — E7800 Pure hypercholesterolemia, unspecified: Secondary | ICD-10-CM | POA: Diagnosis not present

## 2021-04-18 DIAGNOSIS — J4550 Severe persistent asthma, uncomplicated: Secondary | ICD-10-CM | POA: Diagnosis not present

## 2021-04-18 DIAGNOSIS — E119 Type 2 diabetes mellitus without complications: Secondary | ICD-10-CM | POA: Diagnosis not present

## 2021-04-18 DIAGNOSIS — E1140 Type 2 diabetes mellitus with diabetic neuropathy, unspecified: Secondary | ICD-10-CM | POA: Diagnosis not present

## 2021-04-18 DIAGNOSIS — J4520 Mild intermittent asthma, uncomplicated: Secondary | ICD-10-CM | POA: Diagnosis not present

## 2021-05-15 ENCOUNTER — Other Ambulatory Visit: Payer: Self-pay

## 2021-05-15 ENCOUNTER — Ambulatory Visit
Admission: RE | Admit: 2021-05-15 | Discharge: 2021-05-15 | Disposition: A | Discharge status: Home / Self Care | Payer: Medicare Other | Source: Ambulatory Visit | Attending: Obstetrics & Gynecology | Admitting: Obstetrics & Gynecology

## 2021-05-15 DIAGNOSIS — Z1231 Encounter for screening mammogram for malignant neoplasm of breast: Secondary | ICD-10-CM

## 2021-05-19 ENCOUNTER — Other Ambulatory Visit: Payer: Self-pay | Admitting: Obstetrics & Gynecology

## 2021-05-19 ENCOUNTER — Other Ambulatory Visit: Payer: Self-pay

## 2021-05-19 ENCOUNTER — Non-Acute Institutional Stay (HOSPITAL_COMMUNITY)
Admission: RE | Admit: 2021-05-19 | Discharge: 2021-05-19 | Disposition: A | Discharge status: Home / Self Care | Payer: Medicare Other | Source: Ambulatory Visit | Attending: Internal Medicine | Admitting: Internal Medicine

## 2021-05-19 DIAGNOSIS — K5090 Crohn's disease, unspecified, without complications: Secondary | ICD-10-CM | POA: Diagnosis not present

## 2021-05-19 DIAGNOSIS — R928 Other abnormal and inconclusive findings on diagnostic imaging of breast: Secondary | ICD-10-CM

## 2021-05-19 MED ORDER — ACETAMINOPHEN 325 MG PO TABS
650.0000 mg | ORAL_TABLET | Freq: Once | ORAL | Status: AC
  Administered 2021-05-19: 10:00:00 650 mg via ORAL
  Filled 2021-05-19: qty 2

## 2021-05-19 MED ORDER — SODIUM CHLORIDE 0.9 % IV SOLN
INTRAVENOUS | Status: DC | PRN
  Administered 2021-05-19: 09:00:00 250 mL via INTRAVENOUS

## 2021-05-19 MED ORDER — METHYLPREDNISOLONE SODIUM SUCC 40 MG IJ SOLR
20.0000 mg | Freq: Once | INTRAMUSCULAR | Status: AC
  Administered 2021-05-19: 10:00:00 20 mg via INTRAVENOUS
  Filled 2021-05-19: qty 1

## 2021-05-19 MED ORDER — SODIUM CHLORIDE 0.9 % IV SOLN
400.0000 mg | INTRAVENOUS | Status: DC
  Administered 2021-05-19: 10:00:00 400 mg via INTRAVENOUS
  Filled 2021-05-19: qty 40

## 2021-05-19 NOTE — Progress Notes (Signed)
PATIENT CARE CENTER NOTE   Diagnosis: Crohn's Disease     Provider: Wilford Corner, MD     Procedure: IV Remicade      Note: Patient received Remicade infusion via PIV. Pre-medications given per order. Infusion titrated per protocol. Patient tolerated well with no adverse reaction. Vital signs remained stable. Discharge instructions given. Patient to come back in 8 weeks for next dose. Alert, oriented and ambulatory at discharge.

## 2021-05-22 DIAGNOSIS — E7800 Pure hypercholesterolemia, unspecified: Secondary | ICD-10-CM | POA: Diagnosis not present

## 2021-05-22 DIAGNOSIS — E119 Type 2 diabetes mellitus without complications: Secondary | ICD-10-CM | POA: Diagnosis not present

## 2021-05-22 DIAGNOSIS — I10 Essential (primary) hypertension: Secondary | ICD-10-CM | POA: Diagnosis not present

## 2021-05-22 DIAGNOSIS — E1140 Type 2 diabetes mellitus with diabetic neuropathy, unspecified: Secondary | ICD-10-CM | POA: Diagnosis not present

## 2021-05-22 DIAGNOSIS — J4520 Mild intermittent asthma, uncomplicated: Secondary | ICD-10-CM | POA: Diagnosis not present

## 2021-05-22 DIAGNOSIS — E1165 Type 2 diabetes mellitus with hyperglycemia: Secondary | ICD-10-CM | POA: Diagnosis not present

## 2021-05-22 DIAGNOSIS — J4550 Severe persistent asthma, uncomplicated: Secondary | ICD-10-CM | POA: Diagnosis not present

## 2021-05-27 HISTORY — PX: BREAST BIOPSY: SHX20

## 2021-06-06 ENCOUNTER — Ambulatory Visit
Admission: RE | Admit: 2021-06-06 | Discharge: 2021-06-06 | Disposition: A | Discharge status: Home / Self Care | Payer: Medicare Other | Source: Ambulatory Visit | Attending: Obstetrics & Gynecology | Admitting: Obstetrics & Gynecology

## 2021-06-06 ENCOUNTER — Other Ambulatory Visit: Payer: Self-pay | Admitting: Obstetrics & Gynecology

## 2021-06-06 ENCOUNTER — Other Ambulatory Visit: Payer: Self-pay

## 2021-06-06 DIAGNOSIS — R928 Other abnormal and inconclusive findings on diagnostic imaging of breast: Secondary | ICD-10-CM

## 2021-06-06 DIAGNOSIS — R921 Mammographic calcification found on diagnostic imaging of breast: Secondary | ICD-10-CM | POA: Diagnosis not present

## 2021-06-06 DIAGNOSIS — R922 Inconclusive mammogram: Secondary | ICD-10-CM | POA: Diagnosis not present

## 2021-06-16 ENCOUNTER — Other Ambulatory Visit: Payer: Self-pay

## 2021-06-16 ENCOUNTER — Ambulatory Visit
Admission: RE | Admit: 2021-06-16 | Discharge: 2021-06-16 | Disposition: A | Discharge status: Home / Self Care | Payer: Medicare Other | Source: Ambulatory Visit | Attending: Obstetrics & Gynecology | Admitting: Obstetrics & Gynecology

## 2021-06-16 DIAGNOSIS — E7800 Pure hypercholesterolemia, unspecified: Secondary | ICD-10-CM | POA: Diagnosis not present

## 2021-06-16 DIAGNOSIS — J4520 Mild intermittent asthma, uncomplicated: Secondary | ICD-10-CM | POA: Diagnosis not present

## 2021-06-16 DIAGNOSIS — R921 Mammographic calcification found on diagnostic imaging of breast: Secondary | ICD-10-CM | POA: Diagnosis not present

## 2021-06-16 DIAGNOSIS — I10 Essential (primary) hypertension: Secondary | ICD-10-CM | POA: Diagnosis not present

## 2021-06-16 DIAGNOSIS — J4550 Severe persistent asthma, uncomplicated: Secondary | ICD-10-CM | POA: Diagnosis not present

## 2021-06-16 DIAGNOSIS — R928 Other abnormal and inconclusive findings on diagnostic imaging of breast: Secondary | ICD-10-CM

## 2021-06-16 DIAGNOSIS — E1165 Type 2 diabetes mellitus with hyperglycemia: Secondary | ICD-10-CM | POA: Diagnosis not present

## 2021-06-16 DIAGNOSIS — E1140 Type 2 diabetes mellitus with diabetic neuropathy, unspecified: Secondary | ICD-10-CM | POA: Diagnosis not present

## 2021-06-16 DIAGNOSIS — N62 Hypertrophy of breast: Secondary | ICD-10-CM | POA: Diagnosis not present

## 2021-06-16 DIAGNOSIS — E119 Type 2 diabetes mellitus without complications: Secondary | ICD-10-CM | POA: Diagnosis not present

## 2021-07-07 DIAGNOSIS — K5010 Crohn's disease of large intestine without complications: Secondary | ICD-10-CM | POA: Diagnosis not present

## 2021-07-14 ENCOUNTER — Ambulatory Visit: Payer: Self-pay | Admitting: Surgery

## 2021-07-14 DIAGNOSIS — N6489 Other specified disorders of breast: Secondary | ICD-10-CM

## 2021-07-14 NOTE — H&P (Signed)
REFERRING PHYSICIAN:  Maisie Fus, MD  PROVIDER:  Carlean Jews, MD  MRN: [id number] DOB: 1951-11-18 DATE OF ENCOUNTER: 07/14/2021  Subjective   Chief Complaint: Breast Mass (Left breast - CSL)     History of Present Illness: [name] is a 70 y.o. female who is seen today as an office consultation at the request of Dr. Nori Riis for evaluation of Breast Mass (Left breast - CSL) .     This is a 70 year old female who recently underwent routine screening mammogram.  In the left breast, she had some new calcifications and possible distortion.  She underwent further work-up with diagnostic mammogram and ultrasound.  This showed a 4.4 cm area of loosely grouped calcifications in the lower inner left breast.  She underwent biopsy of this area which revealed complex sclerosing lesion with usual ductal hyperplasia and apocrine metaplasia.  She is referred to [place] for excision of this area for definitive diagnosis.   Review of Systems: A complete review of systems was obtained from the patient.  I have reviewed this information and discussed as appropriate with the patient.  See HPI as well for other ROS.  Review of Systems  Constitutional: Negative.   HENT: Negative.   Eyes: Negative.   Respiratory: Negative.   Cardiovascular: Negative.   Gastrointestinal: Negative.   Genitourinary: Negative.   Musculoskeletal: Negative.   Skin: Negative.   Neurological: Negative.   Endo/Heme/Allergies: Negative.   Psychiatric/Behavioral: Negative.       Medical History: Past Medical History:  Diagnosis Date   Anxiety    Diabetes mellitus without complication (CMS-HCC)    GERD (gastroesophageal reflux disease)    Hypertension     Patient Active Problem List  Diagnosis   Complex sclerosing lesion of left breast    Past Surgical History:  Procedure Laterality Date   HYSTERECTOMY       Allergies  Allergen Reactions   Metformin Hcl Unknown    Other reaction(s): severe diarrhea     Current Outpatient Medications on File Prior to Visit  Medication Sig Dispense Refill   acetaminophen (TYLENOL) 650 MG ER tablet Take by mouth     aspirin 81 MG EC tablet Take 81 mg by mouth once daily     dulaglutide (TRULICITY) 8.93 TD/4.2 mL subcutaneous pen injector Trulicity 8.76 OT/1.5 mL subcutaneous pen injector  INJECT 1 PEN UNDER THE SKIN ONCE WEEKLY     empagliflozin (JARDIANCE) 25 mg tablet Jardiance 25 mg tablet     estradioL (ESTRACE) 1 MG tablet estradiol 1 mg tablet  TAKE 1 TABLET BY MOUTH ONCE DAILY     fluticasone propion-salmeteroL (ADVAIR HFA) 115-21 mcg/actuation inhaler Inhale into the lungs     gabapentin (NEURONTIN) 300 MG capsule      glimepiride (AMARYL) 4 MG tablet glimepiride 4 mg tablet     inFLIXimab (REMICADE) 100 mg injection Inject into the vein     losartan-hydrochlorothiazide (HYZAAR) 100-12.5 mg tablet Take 1 tablet by mouth once daily     montelukast (SINGULAIR) 10 mg tablet Take 10 mg by mouth every evening     multivitamin tablet Take 1 tablet by mouth once daily     omeprazole (PRILOSEC) 20 MG DR capsule      sertraline (ZOLOFT) 100 MG tablet      simvastatin (ZOCOR) 40 MG tablet      No current facility-administered medications on file prior to visit.    Family History  Problem Relation Age of Onset   Diabetes  Mother      Social History   Tobacco Use  Smoking Status Never Smoker  Smokeless Tobacco Never Used     Social History   Socioeconomic History   Marital status: Married  Tobacco Use   Smoking status: Never Smoker   Smokeless tobacco: Never Used  Substance and Sexual Activity   Alcohol use: Never   Drug use: Never    Objective:    Vitals:   07/14/21 1022  BP: (!) 142/90  Pulse: 94  Weight: 92.9 kg (204 lb 12.8 oz)  Height: 162.6 cm (5' 4" )    Body mass index is 35.15 kg/m.  Physical Exam   Constitutional:  WDWN in NAD, conversant, no obvious deformities; lying in bed comfortably Eyes:  Pupils equal,  round; sclera anicteric; moist conjunctiva; no lid lag HENT:  Oral mucosa moist; good dentition  Neck:  No masses palpated, trachea midline; no thyromegaly Lungs:  CTA bilaterally; normal respiratory effort Breasts:  symmetric, no nipple changes; no palpable masses or lymphadenopathy on either side CV:  Regular rate and rhythm; no murmurs; extremities well-perfused with no edema Abd:  +bowel sounds, soft, non-tender, no palpable organomegaly; no palpable hernias Musc:  Unable to assess gait; no apparent clubbing or cyanosis in extremities Lymphatic:  No palpable cervical or axillary lymphadenopathy Skin:  Warm, dry; no sign of jaundice Psychiatric - alert and oriented x 4; calm mood and affect   Labs, Imaging and Diagnostic Testing:  Diagnosis Breast, left, needle core biopsy, lower inner - COMPLEX SCLEROSING LESION WITH USUAL DUCTAL HYPERPLASIA, APOCRINE METAPLASIA AND CALCIFICATIONS. SEE NOTE - NEGATIVE FOR CARCINOMA Diagnosis Note [facility] was notified on 06/17/2021. Jaquita Folds MD Pathologist, Electronic Signature (Case signed 06/17/2021)  CLINICAL DATA:  Screening recall for left breast calcifications and a possible distortion.   EXAM: DIGITAL DIAGNOSTIC UNILATERAL LEFT MAMMOGRAM WITH TOMOSYNTHESIS AND CAD; ULTRASOUND LEFT BREAST LIMITED   TECHNIQUE: Left digital diagnostic mammography and breast tomosynthesis was performed. The images were evaluated with computer-aided detection.; Targeted ultrasound examination of the left breast was performed   COMPARISON:  Previous exam(s).   ACR Breast Density Category b: There are scattered areas of fibroglandular density.   FINDINGS: Spot compression tomosynthesis images through the lower slightly inner quadrant of the left breast, posterior depth demonstrates a persistent fine area of distortion. There are associated scattered linear calcifications spanning 4.4 cm.   Ultrasound targeted  to the lower slightly inner left breast demonstrates normal fibroglandular tissue. No suspicious masses or areas of shadowing are identified.   Ultrasound of the left axilla demonstrates multiple normal-appearing lymph nodes.   IMPRESSION: 1. There is a distortion with loosely grouped calcifications in the lower-inner left breast spanning 4.4 cm.   RECOMMENDATION: 1. Stereotactic biopsy is recommended for the left breast distortion with calcifications. The procedure has been scheduled for 06/16/2021 at 9:30 a.m.   I have discussed the findings and recommendations with the patient. If applicable, a reminder letter will be sent to the patient regarding the next appointment.   BI-RADS CATEGORY  4: Suspicious.     Electronically Signed   By: Ammie Ferrier M.D.   On: 06/06/2021 12:56      Assessment and Plan:  Diagnoses and all orders for this visit:  Complex sclerosing lesion of left breast     Left seed localized lumpectomy.The surgical procedure has been discussed with the patient.  Potential risks, benefits, alternative treatments, and expected outcomes have been explained.  All of the  patient's questions at this time have been answered.  The likelihood of reaching the patient's treatment goal is good.  The patient understand the proposed surgical procedure and wishes to proceed.   No follow-ups on file.  Carlean Jews, MD  07/14/2021 12:10 PM

## 2021-07-15 DIAGNOSIS — I10 Essential (primary) hypertension: Secondary | ICD-10-CM | POA: Diagnosis not present

## 2021-07-15 DIAGNOSIS — E119 Type 2 diabetes mellitus without complications: Secondary | ICD-10-CM | POA: Diagnosis not present

## 2021-07-15 DIAGNOSIS — J4520 Mild intermittent asthma, uncomplicated: Secondary | ICD-10-CM | POA: Diagnosis not present

## 2021-07-15 DIAGNOSIS — J4550 Severe persistent asthma, uncomplicated: Secondary | ICD-10-CM | POA: Diagnosis not present

## 2021-07-15 DIAGNOSIS — E1165 Type 2 diabetes mellitus with hyperglycemia: Secondary | ICD-10-CM | POA: Diagnosis not present

## 2021-07-15 DIAGNOSIS — E1140 Type 2 diabetes mellitus with diabetic neuropathy, unspecified: Secondary | ICD-10-CM | POA: Diagnosis not present

## 2021-07-15 DIAGNOSIS — E7800 Pure hypercholesterolemia, unspecified: Secondary | ICD-10-CM | POA: Diagnosis not present

## 2021-07-17 ENCOUNTER — Other Ambulatory Visit: Payer: Self-pay | Admitting: Surgery

## 2021-07-17 DIAGNOSIS — N6489 Other specified disorders of breast: Secondary | ICD-10-CM

## 2021-07-29 ENCOUNTER — Non-Acute Institutional Stay (HOSPITAL_COMMUNITY)
Admission: RE | Admit: 2021-07-29 | Discharge: 2021-07-29 | Disposition: A | Discharge status: Home / Self Care | Payer: Medicare Other | Source: Ambulatory Visit | Attending: Internal Medicine | Admitting: Internal Medicine

## 2021-07-29 ENCOUNTER — Other Ambulatory Visit: Payer: Self-pay

## 2021-07-29 DIAGNOSIS — K5090 Crohn's disease, unspecified, without complications: Secondary | ICD-10-CM | POA: Diagnosis not present

## 2021-07-29 MED ORDER — SODIUM CHLORIDE 0.9 % IV SOLN
400.0000 mg | INTRAVENOUS | Status: DC
  Administered 2021-07-29: 400 mg via INTRAVENOUS
  Filled 2021-07-29: qty 40

## 2021-07-29 MED ORDER — METHYLPREDNISOLONE SODIUM SUCC 40 MG IJ SOLR
20.0000 mg | Freq: Once | INTRAMUSCULAR | Status: AC
  Administered 2021-07-29: 20 mg via INTRAVENOUS
  Filled 2021-07-29: qty 1

## 2021-07-29 MED ORDER — SODIUM CHLORIDE 0.9 % IV SOLN
INTRAVENOUS | Status: DC | PRN
Start: 2021-07-29 — End: 2021-07-30
  Administered 2021-07-29: 250 mL via INTRAVENOUS

## 2021-07-29 MED ORDER — ACETAMINOPHEN 325 MG PO TABS
650.0000 mg | ORAL_TABLET | Freq: Once | ORAL | Status: AC
  Administered 2021-07-29: 650 mg via ORAL
  Filled 2021-07-29: qty 2

## 2021-07-29 NOTE — Progress Notes (Signed)
PATIENT CARE CENTER NOTE    Diagnosis: Crohn's Disease     Provider: Wilford Corner, MD     Procedure: IV Remicade      Note: Patient received Remicade infusion via PIV. Pre-medications given per order (Tylenol and Solumedrol). Infusion titrated per protocol. Patient tolerated well with no adverse reaction. Vital signs remained stable. Discharge instructions given. Patient to come back in 8 weeks for next dose. Alert, oriented and ambulatory at discharge.

## 2021-08-06 ENCOUNTER — Other Ambulatory Visit: Payer: Self-pay

## 2021-08-06 ENCOUNTER — Encounter (HOSPITAL_BASED_OUTPATIENT_CLINIC_OR_DEPARTMENT_OTHER): Payer: Self-pay | Admitting: Surgery

## 2021-08-12 ENCOUNTER — Encounter (HOSPITAL_BASED_OUTPATIENT_CLINIC_OR_DEPARTMENT_OTHER)
Admission: RE | Admit: 2021-08-12 | Discharge: 2021-08-12 | Disposition: A | Discharge status: Home / Self Care | Payer: Medicare Other | Source: Ambulatory Visit | Attending: Surgery | Admitting: Surgery

## 2021-08-12 DIAGNOSIS — Z90710 Acquired absence of both cervix and uterus: Secondary | ICD-10-CM | POA: Diagnosis not present

## 2021-08-12 DIAGNOSIS — K219 Gastro-esophageal reflux disease without esophagitis: Secondary | ICD-10-CM | POA: Diagnosis not present

## 2021-08-12 DIAGNOSIS — Z01812 Encounter for preprocedural laboratory examination: Secondary | ICD-10-CM | POA: Diagnosis not present

## 2021-08-12 DIAGNOSIS — I10 Essential (primary) hypertension: Secondary | ICD-10-CM | POA: Diagnosis not present

## 2021-08-12 DIAGNOSIS — Z833 Family history of diabetes mellitus: Secondary | ICD-10-CM | POA: Diagnosis not present

## 2021-08-12 DIAGNOSIS — Z79899 Other long term (current) drug therapy: Secondary | ICD-10-CM | POA: Diagnosis not present

## 2021-08-12 DIAGNOSIS — N6082 Other benign mammary dysplasias of left breast: Secondary | ICD-10-CM | POA: Diagnosis not present

## 2021-08-12 DIAGNOSIS — N6092 Unspecified benign mammary dysplasia of left breast: Secondary | ICD-10-CM | POA: Diagnosis not present

## 2021-08-12 DIAGNOSIS — Z7951 Long term (current) use of inhaled steroids: Secondary | ICD-10-CM | POA: Diagnosis not present

## 2021-08-12 DIAGNOSIS — N6489 Other specified disorders of breast: Secondary | ICD-10-CM | POA: Diagnosis not present

## 2021-08-12 DIAGNOSIS — Z888 Allergy status to other drugs, medicaments and biological substances status: Secondary | ICD-10-CM | POA: Diagnosis not present

## 2021-08-12 DIAGNOSIS — E119 Type 2 diabetes mellitus without complications: Secondary | ICD-10-CM | POA: Diagnosis not present

## 2021-08-12 DIAGNOSIS — Z7982 Long term (current) use of aspirin: Secondary | ICD-10-CM | POA: Diagnosis not present

## 2021-08-12 LAB — BASIC METABOLIC PANEL
Anion gap: 10 (ref 5–15)
BUN: 14 mg/dL (ref 8–23)
CO2: 26 mmol/L (ref 22–32)
Calcium: 9.7 mg/dL (ref 8.9–10.3)
Chloride: 103 mmol/L (ref 98–111)
Creatinine, Ser: 0.8 mg/dL (ref 0.44–1.00)
GFR, Estimated: >60 mL/min (ref >60)
Glucose, Bld: 180 mg/dL — ABNORMAL HIGH (ref 70–99)
Potassium: 3.7 mmol/L (ref 3.5–5.1)
Sodium: 139 mmol/L (ref 135–145)

## 2021-08-12 NOTE — Progress Notes (Signed)

## 2021-08-13 ENCOUNTER — Ambulatory Visit
Admission: RE | Admit: 2021-08-13 | Discharge: 2021-08-13 | Disposition: A | Discharge status: Home / Self Care | Payer: Medicare Other | Source: Ambulatory Visit | Attending: Surgery | Admitting: Surgery

## 2021-08-13 ENCOUNTER — Other Ambulatory Visit: Payer: Self-pay

## 2021-08-13 DIAGNOSIS — N6489 Other specified disorders of breast: Secondary | ICD-10-CM

## 2021-08-13 DIAGNOSIS — R921 Mammographic calcification found on diagnostic imaging of breast: Secondary | ICD-10-CM | POA: Diagnosis not present

## 2021-08-13 HISTORY — PX: BREAST EXCISIONAL BIOPSY: SUR124

## 2021-08-14 ENCOUNTER — Ambulatory Visit
Admission: RE | Admit: 2021-08-14 | Discharge: 2021-08-14 | Disposition: A | Discharge status: Home / Self Care | Payer: Medicare Other | Source: Ambulatory Visit | Attending: Surgery | Admitting: Surgery

## 2021-08-14 ENCOUNTER — Ambulatory Visit (HOSPITAL_BASED_OUTPATIENT_CLINIC_OR_DEPARTMENT_OTHER)
Admission: RE | Admit: 2021-08-14 | Discharge: 2021-08-14 | Disposition: A | Discharge status: Home / Self Care | Payer: Medicare Other | Attending: Surgery | Admitting: Surgery

## 2021-08-14 ENCOUNTER — Ambulatory Visit (HOSPITAL_BASED_OUTPATIENT_CLINIC_OR_DEPARTMENT_OTHER): Payer: Medicare Other | Admitting: Certified Registered"

## 2021-08-14 ENCOUNTER — Encounter (HOSPITAL_BASED_OUTPATIENT_CLINIC_OR_DEPARTMENT_OTHER)
Admission: RE | Disposition: A | Discharge status: Home / Self Care | Payer: Self-pay | Source: Home / Self Care | Attending: Surgery

## 2021-08-14 ENCOUNTER — Other Ambulatory Visit: Payer: Self-pay

## 2021-08-14 ENCOUNTER — Encounter (HOSPITAL_BASED_OUTPATIENT_CLINIC_OR_DEPARTMENT_OTHER): Payer: Self-pay | Admitting: Surgery

## 2021-08-14 DIAGNOSIS — Z833 Family history of diabetes mellitus: Secondary | ICD-10-CM | POA: Diagnosis not present

## 2021-08-14 DIAGNOSIS — Z7982 Long term (current) use of aspirin: Secondary | ICD-10-CM | POA: Diagnosis not present

## 2021-08-14 DIAGNOSIS — N6489 Other specified disorders of breast: Secondary | ICD-10-CM | POA: Insufficient documentation

## 2021-08-14 DIAGNOSIS — Z79899 Other long term (current) drug therapy: Secondary | ICD-10-CM | POA: Diagnosis not present

## 2021-08-14 DIAGNOSIS — E119 Type 2 diabetes mellitus without complications: Secondary | ICD-10-CM | POA: Diagnosis not present

## 2021-08-14 DIAGNOSIS — Z7951 Long term (current) use of inhaled steroids: Secondary | ICD-10-CM | POA: Diagnosis not present

## 2021-08-14 DIAGNOSIS — K219 Gastro-esophageal reflux disease without esophagitis: Secondary | ICD-10-CM | POA: Diagnosis not present

## 2021-08-14 DIAGNOSIS — N6082 Other benign mammary dysplasias of left breast: Secondary | ICD-10-CM | POA: Insufficient documentation

## 2021-08-14 DIAGNOSIS — I10 Essential (primary) hypertension: Secondary | ICD-10-CM | POA: Diagnosis not present

## 2021-08-14 DIAGNOSIS — R928 Other abnormal and inconclusive findings on diagnostic imaging of breast: Secondary | ICD-10-CM | POA: Diagnosis not present

## 2021-08-14 DIAGNOSIS — N6032 Fibrosclerosis of left breast: Secondary | ICD-10-CM | POA: Diagnosis not present

## 2021-08-14 DIAGNOSIS — Z90710 Acquired absence of both cervix and uterus: Secondary | ICD-10-CM | POA: Insufficient documentation

## 2021-08-14 DIAGNOSIS — N6092 Unspecified benign mammary dysplasia of left breast: Secondary | ICD-10-CM | POA: Diagnosis not present

## 2021-08-14 DIAGNOSIS — Z888 Allergy status to other drugs, medicaments and biological substances status: Secondary | ICD-10-CM | POA: Insufficient documentation

## 2021-08-14 HISTORY — DX: Anxiety disorder, unspecified: F41.9

## 2021-08-14 HISTORY — PX: BREAST LUMPECTOMY WITH RADIOACTIVE SEED LOCALIZATION: SHX6424

## 2021-08-14 HISTORY — DX: Gastro-esophageal reflux disease without esophagitis: K21.9

## 2021-08-14 HISTORY — DX: Depression, unspecified: F32.A

## 2021-08-14 LAB — GLUCOSE, CAPILLARY
Glucose-Capillary: 119 mg/dL — ABNORMAL HIGH (ref 70–99)
Glucose-Capillary: 160 mg/dL — ABNORMAL HIGH (ref 70–99)

## 2021-08-14 SURGERY — BREAST LUMPECTOMY WITH RADIOACTIVE SEED LOCALIZATION
Anesthesia: General | Site: Breast | Laterality: Left

## 2021-08-14 MED ORDER — FENTANYL CITRATE (PF) 100 MCG/2ML IJ SOLN
INTRAMUSCULAR | Status: AC
  Filled 2021-08-14: qty 2

## 2021-08-14 MED ORDER — CHLORHEXIDINE GLUCONATE CLOTH 2 % EX PADS: 6.0000 | MEDICATED_PAD | Freq: Once | CUTANEOUS | Status: DC

## 2021-08-14 MED ORDER — FENTANYL CITRATE (PF) 100 MCG/2ML IJ SOLN: 25.0000 ug | INTRAMUSCULAR | Status: DC | PRN

## 2021-08-14 MED ORDER — DEXAMETHASONE SODIUM PHOSPHATE 10 MG/ML IJ SOLN
INTRAMUSCULAR | Status: DC | PRN
  Administered 2021-08-14: 4 mg via INTRAVENOUS

## 2021-08-14 MED ORDER — LIDOCAINE 2% (20 MG/ML) 5 ML SYRINGE
INTRAMUSCULAR | Status: DC | PRN
  Administered 2021-08-14: 20 mg via INTRAVENOUS

## 2021-08-14 MED ORDER — ONDANSETRON HCL 4 MG/2ML IJ SOLN
INTRAMUSCULAR | Status: AC
  Filled 2021-08-14: qty 2

## 2021-08-14 MED ORDER — ACETAMINOPHEN 500 MG PO TABS
1000.0000 mg | ORAL_TABLET | ORAL | Status: AC
  Administered 2021-08-14: 1000 mg via ORAL

## 2021-08-14 MED ORDER — DEXAMETHASONE SODIUM PHOSPHATE 10 MG/ML IJ SOLN
INTRAMUSCULAR | Status: AC
  Filled 2021-08-14: qty 1

## 2021-08-14 MED ORDER — PROPOFOL 10 MG/ML IV BOLUS
INTRAVENOUS | Status: DC | PRN
Start: 2021-08-14 — End: 2021-08-14
  Administered 2021-08-14: 100 mg via INTRAVENOUS
  Administered 2021-08-14: 200 mg via INTRAVENOUS

## 2021-08-14 MED ORDER — PHENYLEPHRINE 40 MCG/ML (10ML) SYRINGE FOR IV PUSH (FOR BLOOD PRESSURE SUPPORT)
PREFILLED_SYRINGE | INTRAVENOUS | Status: AC
  Filled 2021-08-14: qty 10

## 2021-08-14 MED ORDER — LIDOCAINE HCL (PF) 2 % IJ SOLN
INTRAMUSCULAR | Status: AC
  Filled 2021-08-14: qty 5

## 2021-08-14 MED ORDER — OXYCODONE HCL 5 MG PO TABS: 5.0000 mg | ORAL_TABLET | Freq: Once | ORAL | Status: DC | PRN

## 2021-08-14 MED ORDER — ONDANSETRON HCL 4 MG/2ML IJ SOLN
INTRAMUSCULAR | Status: DC | PRN
  Administered 2021-08-14: 4 mg via INTRAVENOUS

## 2021-08-14 MED ORDER — FENTANYL CITRATE (PF) 100 MCG/2ML IJ SOLN
INTRAMUSCULAR | Status: DC | PRN
  Administered 2021-08-14 (×2): 25 ug via INTRAVENOUS

## 2021-08-14 MED ORDER — PROPOFOL 10 MG/ML IV BOLUS
INTRAVENOUS | Status: AC
  Filled 2021-08-14: qty 20

## 2021-08-14 MED ORDER — ACETAMINOPHEN 500 MG PO TABS
ORAL_TABLET | ORAL | Status: AC
  Filled 2021-08-14: qty 2

## 2021-08-14 MED ORDER — PHENYLEPHRINE 40 MCG/ML (10ML) SYRINGE FOR IV PUSH (FOR BLOOD PRESSURE SUPPORT)
PREFILLED_SYRINGE | INTRAVENOUS | Status: DC | PRN
  Administered 2021-08-14: 80 ug via INTRAVENOUS

## 2021-08-14 MED ORDER — PROMETHAZINE HCL 25 MG/ML IJ SOLN: 6.2500 mg | INTRAMUSCULAR | Status: DC | PRN

## 2021-08-14 MED ORDER — OXYCODONE HCL 5 MG/5ML PO SOLN: 5.0000 mg | Freq: Once | ORAL | Status: DC | PRN

## 2021-08-14 MED ORDER — CEFAZOLIN SODIUM-DEXTROSE 2-4 GM/100ML-% IV SOLN
2.0000 g | INTRAVENOUS | Status: AC
  Administered 2021-08-14: 2 g via INTRAVENOUS

## 2021-08-14 MED ORDER — MIDAZOLAM HCL 2 MG/2ML IJ SOLN: 0.5000 mg | Freq: Once | INTRAMUSCULAR | Status: DC | PRN

## 2021-08-14 MED ORDER — LACTATED RINGERS IV SOLN: INTRAVENOUS | Status: DC

## 2021-08-14 MED ORDER — BUPIVACAINE-EPINEPHRINE 0.25% -1:200000 IJ SOLN
INTRAMUSCULAR | Status: DC | PRN
  Administered 2021-08-14: 10 mL

## 2021-08-14 MED ORDER — CEFAZOLIN SODIUM-DEXTROSE 2-4 GM/100ML-% IV SOLN
INTRAVENOUS | Status: AC
  Filled 2021-08-14: qty 100

## 2021-08-14 SURGICAL SUPPLY — 52 items
APL PRP STRL LF DISP 70% ISPRP (MISCELLANEOUS) ×1
APL SKNCLS STERI-STRIP NONHPOA (GAUZE/BANDAGES/DRESSINGS) ×1
APPLIER CLIP 9.375 MED OPEN (MISCELLANEOUS)
APR CLP MED 9.3 20 MLT OPN (MISCELLANEOUS)
BENZOIN TINCTURE PRP APPL 2/3 (GAUZE/BANDAGES/DRESSINGS) ×2 IMPLANT
BLADE HEX COATED 2.75 (ELECTRODE) ×2 IMPLANT
BLADE SURG 15 STRL LF DISP TIS (BLADE) ×1 IMPLANT
BLADE SURG 15 STRL SS (BLADE) ×2
CANISTER SUC SOCK COL 7IN (MISCELLANEOUS) IMPLANT
CANISTER SUCT 1200ML W/VALVE (MISCELLANEOUS) IMPLANT
CHLORAPREP W/TINT 26 (MISCELLANEOUS) ×2 IMPLANT
CLIP APPLIE 9.375 MED OPEN (MISCELLANEOUS) ×1 IMPLANT
COVER BACK TABLE 60X90IN (DRAPES) ×2 IMPLANT
COVER MAYO STAND STRL (DRAPES) ×2 IMPLANT
COVER PROBE W GEL 5X96 (DRAPES) ×2 IMPLANT
DECANTER SPIKE VIAL GLASS SM (MISCELLANEOUS) IMPLANT
DRAPE LAPAROTOMY 100X72 PEDS (DRAPES) ×2 IMPLANT
DRAPE UTILITY XL STRL (DRAPES) ×2 IMPLANT
DRSG TEGADERM 4X4.75 (GAUZE/BANDAGES/DRESSINGS) ×2 IMPLANT
ELECT REM PT RETURN 9FT ADLT (ELECTROSURGICAL) ×2
ELECTRODE REM PT RTRN 9FT ADLT (ELECTROSURGICAL) ×1 IMPLANT
GAUZE SPONGE 4X4 12PLY STRL LF (GAUZE/BANDAGES/DRESSINGS) ×1 IMPLANT
GLOVE SURG ENC MOIS LTX SZ7 (GLOVE) ×2 IMPLANT
GLOVE SURG POLYISO LF SZ7 (GLOVE) ×1 IMPLANT
GLOVE SURG UNDER POLY LF SZ7 (GLOVE) ×2 IMPLANT
GLOVE SURG UNDER POLY LF SZ7.5 (GLOVE) ×2 IMPLANT
GOWN STRL REUS W/ TWL LRG LVL3 (GOWN DISPOSABLE) ×2 IMPLANT
GOWN STRL REUS W/TWL LRG LVL3 (GOWN DISPOSABLE) ×2
GOWN STRL REUS W/TWL XL LVL3 (GOWN DISPOSABLE) ×1 IMPLANT
ILLUMINATOR WAVEGUIDE N/F (MISCELLANEOUS) IMPLANT
KIT MARKER MARGIN INK (KITS) ×2 IMPLANT
LIGHT WAVEGUIDE WIDE FLAT (MISCELLANEOUS) IMPLANT
NDL HYPO 25X1 1.5 SAFETY (NEEDLE) ×1 IMPLANT
NEEDLE HYPO 25X1 1.5 SAFETY (NEEDLE) ×2 IMPLANT
NS IRRIG 1000ML POUR BTL (IV SOLUTION) ×1 IMPLANT
PACK BASIN DAY SURGERY FS (CUSTOM PROCEDURE TRAY) ×2 IMPLANT
PENCIL SMOKE EVACUATOR (MISCELLANEOUS) ×2 IMPLANT
SLEEVE SCD COMPRESS KNEE MED (STOCKING) ×2 IMPLANT
SPONGE GAUZE 2X2 8PLY STRL LF (GAUZE/BANDAGES/DRESSINGS) IMPLANT
SPONGE T-LAP 18X18 ~~LOC~~+RFID (SPONGE) IMPLANT
SPONGE T-LAP 4X18 ~~LOC~~+RFID (SPONGE) ×2 IMPLANT
STRIP CLOSURE SKIN 1/2X4 (GAUZE/BANDAGES/DRESSINGS) ×2 IMPLANT
SUT MON AB 4-0 PC3 18 (SUTURE) ×2 IMPLANT
SUT SILK 2 0 SH (SUTURE) IMPLANT
SUT VIC AB 3-0 SH 27 (SUTURE) ×2
SUT VIC AB 3-0 SH 27X BRD (SUTURE) ×1 IMPLANT
SYR BULB EAR ULCER 3OZ GRN STR (SYRINGE) IMPLANT
SYR CONTROL 10ML LL (SYRINGE) ×2 IMPLANT
TOWEL GREEN STERILE FF (TOWEL DISPOSABLE) ×2 IMPLANT
TRAY FAXITRON CT DISP (TRAY / TRAY PROCEDURE) ×2 IMPLANT
TUBE CONNECTING 20X1/4 (TUBING) IMPLANT
YANKAUER SUCT BULB TIP NO VENT (SUCTIONS) IMPLANT

## 2021-08-14 NOTE — Transfer of Care (Signed)
Immediate Anesthesia Transfer of Care Note  Patient: [name]  Procedure(s) Performed: LEFT BREAST LUMPECTOMY WITH RADIOACTIVE SEED LOCALIZATION (Left: Breast)  Patient Location: PACU  Anesthesia Type:General  Level of Consciousness: awake, alert  and oriented  Airway & Oxygen Therapy: Patient Spontanous Breathing and Patient connected to face mask oxygen  Post-op Assessment: Report given to RN, Post -op Vital signs reviewed and stable and Patient moving all extremities X 4  Post vital signs: Reviewed and stable  Last Vitals:  Vitals Value Taken Time  BP 117/81   Temp    Pulse 91   Resp 14   SpO2 97     Last Pain:  Vitals:   08/14/21 0633  TempSrc: Oral  PainSc: 0-No pain         Complications: No notable events documented.

## 2021-08-14 NOTE — Discharge Instructions (Addendum)
[name] Office Phone Number [phone]  BREAST BIOPSY/ PARTIAL MASTECTOMY: POST OP INSTRUCTIONS  Always review your discharge instruction sheet given to you by the facility where your surgery was performed.  IF YOU HAVE DISABILITY OR FAMILY LEAVE FORMS, YOU MUST BRING THEM TO THE OFFICE FOR PROCESSING.  DO NOT GIVE THEM TO YOUR DOCTOR.  A prescription for pain medication may be given to you upon discharge.  Take your pain medication as prescribed, if needed.  If narcotic pain medicine is not needed, then you may take acetaminophen (Tylenol) or ibuprofen (Advil) as needed. Take your usually prescribed medications unless otherwise directed If you need a refill on your pain medication, please contact your pharmacy.  They will contact our office to request authorization.  Prescriptions will not be filled after 5pm or on week-ends. You should eat very light the first 24 hours after surgery, such as soup, crackers, pudding, etc.  Resume your normal diet the day after surgery. Most patients will experience some swelling and bruising in the breast.  Ice packs and a good support bra will help.  Swelling and bruising can take several days to resolve.  It is common to experience some constipation if taking pain medication after surgery.  Increasing fluid intake and taking a stool softener will usually help or prevent this problem from occurring.  A mild laxative (Milk of Magnesia or Miralax) should be taken according to package directions if there are no bowel movements after 48 hours. Unless discharge instructions indicate otherwise, you may remove your bandages 24-48 hours after surgery, and you may shower at that time.  You may have steri-strips (small skin tapes) in place directly over the incision.  These strips should be left on the skin for 7-10 days.  If your surgeon used skin glue on the incision, you may shower in 24 hours.  The glue will flake off over the next 2-3 weeks.  Any  sutures or staples will be removed at the office during your follow-up visit. ACTIVITIES:  You may resume regular daily activities (gradually increasing) beginning the next day.  Wearing a good support bra or sports bra minimizes pain and swelling.  You may have sexual intercourse when it is comfortable. You may drive when you no longer are taking prescription pain medication, you can comfortably wear a seatbelt, and you can safely maneuver your car and apply brakes. RETURN TO WORK:  ______________________________________________________________________________________ [name] should see your doctor in the office for a follow-up appointment approximately two weeks after your surgery.  Your doctor's nurse will typically make your follow-up appointment when she calls you with your pathology report.  Expect your pathology report 2-3 business days after your surgery.  You may call to check if you do not hear from [place] after three days. OTHER INSTRUCTIONS: _______________________________________________________________________________________________ _____________________________________________________________________________________________________________________________________ _____________________________________________________________________________________________________________________________________ _____________________________________________________________________________________________________________________________________  WHEN TO CALL YOUR DOCTOR: Fever over 101.0 Nausea and/or vomiting. Extreme swelling or bruising. Continued bleeding from incision. Increased pain, redness, or drainage from the incision.  The clinic staff is available to answer your questions during regular business hours.  Please don't hesitate to call and ask to speak to one of the nurses for clinical concerns.  If you have a medical emergency, go to the nearest emergency room or call 911.  A surgeon from [facility] is always on call at the hospital.  For further questions, please visit [email or web address]   No tylenol until after 12:45pm today.   Post Anesthesia Home Care Instructions  Activity: Get  plenty of rest for the remainder of the day. A responsible individual must stay with you for 24 hours following the procedure.  For the next 24 hours, DO NOT: -Drive a car -Paediatric nurse -Drink alcoholic beverages -Take any medication unless instructed by your physician -Make any legal decisions or sign important papers.  Meals: Start with liquid foods such as gelatin or soup. Progress to regular foods as tolerated. Avoid greasy, spicy, heavy foods. If nausea and/or vomiting occur, drink only clear liquids until the nausea and/or vomiting subsides. Call your physician if vomiting continues.  Special Instructions/Symptoms: Your throat may feel dry or sore from the anesthesia or the breathing tube placed in your throat during surgery. If this causes discomfort, gargle with warm salt water. The discomfort should disappear within 24 hours.  If you had a scopolamine patch placed behind your ear for the management of post- operative nausea and/or vomiting:  1. The medication in the patch is effective for 72 hours, after which it should be removed.  Wrap patch in a tissue and discard in the trash. Wash hands thoroughly with soap and water. 2. You may remove the patch earlier than 72 hours if you experience unpleasant side effects which may include dry mouth, dizziness or visual disturbances. 3. Avoid touching the patch. Wash your hands with soap and water after contact with the patch.

## 2021-08-14 NOTE — Anesthesia Postprocedure Evaluation (Signed)
Anesthesia Post Note  Patient: [name]  Procedure(s) Performed: LEFT BREAST LUMPECTOMY WITH RADIOACTIVE SEED LOCALIZATION (Left: Breast)     Patient location during evaluation: PACU Anesthesia Type: General Level of consciousness: awake and alert Pain management: pain level controlled Vital Signs Assessment: post-procedure vital signs reviewed and stable Respiratory status: spontaneous breathing, nonlabored ventilation and respiratory function stable Cardiovascular status: blood pressure returned to baseline and stable Postop Assessment: no apparent nausea or vomiting Anesthetic complications: no   No notable events documented.  Last Vitals:  Vitals:   08/14/21 0930 08/14/21 0938  BP: (!) 142/65 (!) 155/70  Pulse: 73 70  Resp: (!) 26 18  Temp:  36.8 C  SpO2: 90% 90%    Last Pain:  Vitals:   08/14/21 0938  TempSrc:   PainSc: 2                  Lynda Rainwater

## 2021-08-14 NOTE — Anesthesia Procedure Notes (Signed)
Procedure Name: LMA Insertion Date/Time: 08/14/2021 7:39 AM Performed by: Niel Hummer, CRNA Pre-anesthesia Checklist: Patient identified, Emergency Drugs available, Suction available and Patient being monitored Patient Re-evaluated:Patient Re-evaluated prior to induction Oxygen Delivery Method: Circle system utilized Preoxygenation: Pre-oxygenation with 100% oxygen Induction Type: IV induction LMA: LMA inserted LMA Size: 4.0 Number of attempts: 1 Dental Injury: Teeth and Oropharynx as per pre-operative assessment

## 2021-08-14 NOTE — H&P (Signed)
Subjective    Chief Complaint: Breast Mass (Left breast - CSL)       History of Present Illness: [name] is a 70 y.o. female who is seen today as an office consultation at the request of Dr. Nori Riis for evaluation of Breast Mass (Left breast - CSL) .     This is a 70 year old female who recently underwent routine screening mammogram.  In the left breast, she had some new calcifications and possible distortion.  She underwent further work-up with diagnostic mammogram and ultrasound.  This showed a 4.4 cm area of loosely grouped calcifications in the lower inner left breast.  She underwent biopsy of this area which revealed complex sclerosing lesion with usual ductal hyperplasia and apocrine metaplasia.  She is referred to [place] for excision of this area for definitive diagnosis.     Review of Systems: A complete review of systems was obtained from the patient.  I have reviewed this information and discussed as appropriate with the patient.  See HPI as well for other ROS.   Review of Systems  Constitutional: Negative.   HENT: Negative.   Eyes: Negative.   Respiratory: Negative.   Cardiovascular: Negative.   Gastrointestinal: Negative.   Genitourinary: Negative.   Musculoskeletal: Negative.   Skin: Negative.   Neurological: Negative.   Endo/Heme/Allergies: Negative.   Psychiatric/Behavioral: Negative.         Medical History:     Past Medical History:  Diagnosis Date   Anxiety     Diabetes mellitus without complication (CMS-HCC)     GERD (gastroesophageal reflux disease)     Hypertension           Patient Active Problem List  Diagnosis   Complex sclerosing lesion of left breast           Past Surgical History:  Procedure Laterality Date   HYSTERECTOMY               Allergies  Allergen Reactions   Metformin Hcl Unknown      Other reaction(s): severe diarrhea            Current Outpatient Medications on File Prior to Visit  Medication Sig Dispense Refill    acetaminophen (TYLENOL) 650 MG ER tablet Take by mouth       aspirin 81 MG EC tablet Take 81 mg by mouth once daily       dulaglutide (TRULICITY) 3.97 QB/3.4 mL subcutaneous pen injector Trulicity 1.93 XT/0.2 mL subcutaneous pen injector  INJECT 1 PEN UNDER THE SKIN ONCE WEEKLY       empagliflozin (JARDIANCE) 25 mg tablet Jardiance 25 mg tablet       estradioL (ESTRACE) 1 MG tablet estradiol 1 mg tablet  TAKE 1 TABLET BY MOUTH ONCE DAILY       fluticasone propion-salmeteroL (ADVAIR HFA) 115-21 mcg/actuation inhaler Inhale into the lungs       gabapentin (NEURONTIN) 300 MG capsule         glimepiride (AMARYL) 4 MG tablet glimepiride 4 mg tablet       inFLIXimab (REMICADE) 100 mg injection Inject into the vein       losartan-hydrochlorothiazide (HYZAAR) 100-12.5 mg tablet Take 1 tablet by mouth once daily       montelukast (SINGULAIR) 10 mg tablet Take 10 mg by mouth every evening       multivitamin tablet Take 1 tablet by mouth once daily       omeprazole (PRILOSEC) 20 MG DR capsule  sertraline (ZOLOFT) 100 MG tablet         simvastatin (ZOCOR) 40 MG tablet          No current facility-administered medications on file prior to visit.           Family History  Problem Relation Age of Onset   Diabetes Mother        Social History       Tobacco Use  Smoking Status Never Smoker  Smokeless Tobacco Never Used      Social History        Socioeconomic History   Marital status: Married  Tobacco Use   Smoking status: Never Smoker   Smokeless tobacco: Never Used  Substance and Sexual Activity   Alcohol use: Never   Drug use: Never      Objective:         Vitals:    07/14/21 1022  BP: (!) 142/90  Pulse: 94  Weight: 92.9 kg (204 lb 12.8 oz)  Height: 162.6 cm (5' 4" )    Body mass index is 35.15 kg/m.   Physical Exam    Constitutional:  WDWN in NAD, conversant, no obvious deformities; lying in bed comfortably Eyes:  Pupils equal, round; sclera anicteric;  moist conjunctiva; no lid lag HENT:  Oral mucosa moist; good dentition  Neck:  No masses palpated, trachea midline; no thyromegaly Lungs:  CTA bilaterally; normal respiratory effort Breasts:  symmetric, no nipple changes; no palpable masses or lymphadenopathy on either side CV:  Regular rate and rhythm; no murmurs; extremities well-perfused with no edema Abd:  +bowel sounds, soft, non-tender, no palpable organomegaly; no palpable hernias Musc:  Unable to assess gait; no apparent clubbing or cyanosis in extremities Lymphatic:  No palpable cervical or axillary lymphadenopathy Skin:  Warm, dry; no sign of jaundice Psychiatric - alert and oriented x 4; calm mood and affect     Labs, Imaging and Diagnostic Testing:   Diagnosis Breast, left, needle core biopsy, lower inner - COMPLEX SCLEROSING LESION WITH USUAL DUCTAL HYPERPLASIA, APOCRINE METAPLASIA AND CALCIFICATIONS. SEE NOTE - NEGATIVE FOR CARCINOMA Diagnosis Note [facility] was notified on 06/17/2021. Jaquita Folds MD Pathologist, Electronic Signature (Case signed 06/17/2021)   CLINICAL DATA:  Screening recall for left breast calcifications and a possible distortion.   EXAM: DIGITAL DIAGNOSTIC UNILATERAL LEFT MAMMOGRAM WITH TOMOSYNTHESIS AND CAD; ULTRASOUND LEFT BREAST LIMITED   TECHNIQUE: Left digital diagnostic mammography and breast tomosynthesis was performed. The images were evaluated with computer-aided detection.; Targeted ultrasound examination of the left breast was performed   COMPARISON:  Previous exam(s).   ACR Breast Density Category b: There are scattered areas of fibroglandular density.   FINDINGS: Spot compression tomosynthesis images through the lower slightly inner quadrant of the left breast, posterior depth demonstrates a persistent fine area of distortion. There are associated scattered linear calcifications spanning 4.4 cm.   Ultrasound targeted to the lower slightly  inner left breast demonstrates normal fibroglandular tissue. No suspicious masses or areas of shadowing are identified.   Ultrasound of the left axilla demonstrates multiple normal-appearing lymph nodes.   IMPRESSION: 1. There is a distortion with loosely grouped calcifications in the lower-inner left breast spanning 4.4 cm.   RECOMMENDATION: 1. Stereotactic biopsy is recommended for the left breast distortion with calcifications. The procedure has been scheduled for 06/16/2021 at 9:30 a.m.   I have discussed the findings and recommendations with the patient. If applicable, a reminder letter will be sent to the patient regarding  the next appointment.   BI-RADS CATEGORY  4: Suspicious.     Electronically Signed   By: Ammie Ferrier M.D.   On: 06/06/2021 12:56       Assessment and Plan:  Diagnoses and all orders for this visit:   Complex sclerosing lesion of left breast       Left seed localized lumpectomy.The surgical procedure has been discussed with the patient.  Potential risks, benefits, alternative treatments, and expected outcomes have been explained.  All of the patient's questions at this time have been answered.  The likelihood of reaching the patient's treatment goal is good.  The patient understand the proposed surgical procedure and wishes to proceed.  Imogene Burn. Georgette Dover, MD, [facility]  General Surgery   08/14/2021 7:20 AM

## 2021-08-14 NOTE — Anesthesia Preprocedure Evaluation (Addendum)
Anesthesia Evaluation  Patient identified by MRN, date of birth, ID band Patient awake    Reviewed: Allergy & Precautions, NPO status , Patient's Chart, lab work & pertinent test results  History of Anesthesia Complications Negative for: history of anesthetic complications  Airway Mallampati: II  TM Distance: >3 FB Neck ROM: Full    Dental  (+) Poor Dentition, Missing, Chipped, Dental Advisory Given   Pulmonary asthma ,    breath sounds clear to auscultation       Cardiovascular hypertension, Pt. on medications (-) angina Rhythm:Regular Rate:Normal     Neuro/Psych Anxiety Depression sciatica    GI/Hepatic Neg liver ROS, GERD  Medicated and Controlled,Crohn's   Endo/Other  diabetes (glu 119), Oral Hypoglycemic Agents  Renal/GU negative Renal ROS     Musculoskeletal   Abdominal (+) + obese,   Peds  Hematology B-cell lymphoma   Anesthesia Other Findings   Reproductive/Obstetrics                            Anesthesia Physical Anesthesia Plan  ASA: 3  Anesthesia Plan: General   Post-op Pain Management:    Induction: Intravenous  PONV Risk Score and Plan: 3 and Ondansetron, Dexamethasone and Treatment may vary due to age or medical condition  Airway Management Planned: LMA  Additional Equipment: None  Intra-op Plan:   Post-operative Plan:   Informed Consent: I have reviewed the patients History and Physical, chart, labs and discussed the procedure including the risks, benefits and alternatives for the proposed anesthesia with the patient or authorized representative who has indicated his/her understanding and acceptance.     Dental advisory given  Plan Discussed with: CRNA and Surgeon  Anesthesia Plan Comments:         Anesthesia Quick Evaluation

## 2021-08-14 NOTE — Op Note (Signed)
Pre-op Diagnosis:  Left complex sclerosing lesion Post-op Diagnosis: same Procedure:  Left radioactive seed localized lumpectomy Surgeon:  Norvell Caswell K. Anesthesia:  GEN - LMA Indications:  This is a 70 year old female who recently underwent routine screening mammogram.  In the left breast, she had some new calcifications and possible distortion.  She underwent further work-up with diagnostic mammogram and ultrasound.  This showed a 4.4 cm area of loosely grouped calcifications in the lower inner left breast.  She underwent biopsy of this area which revealed complex sclerosing lesion with usual ductal hyperplasia and apocrine metaplasia.  She is referred to [place] for excision of this area for definitive diagnosis.  A radioactive seed was placed yesterday by radiology, but is located about 3.4 cm from the biopsy clip.  The seed is in the middle of the area of calcifications.   Description of procedure: The patient is brought to the operating room placed in supine position on the operating room table. After an adequate level of general anesthesia was obtained, her left breast was prepped with ChloraPrep and draped in sterile fashion. A timeout was taken to ensure the proper patient and proper procedure. We interrogated the breast with the neoprobe. We made a circumareolar incision around the side of the nipple after infiltrating with 0.25% Marcaine. Dissection was carried down in the breast tissue with cautery. We used the neoprobe to guide [place] towards the radioactive seed. We excised an area of tissue around the radioactive seed 5 x 3 cm. The specimen was removed and was oriented with a paint kit. Specimen mammogram showed the radioactive seed as well as the biopsy clip within the specimen. This was sent for pathologic examination. There is no residual radioactivity within the biopsy cavity. The breast tissue above the the biopsy cavity is mobilized from the deep underlying muscle to allow it to slide down  into the biopsy cavity.  We inspected carefully for hemostasis. The wound was thoroughly irrigated. The wound was closed with a deep layer of 3-0 Vicryl and a subcuticular layer of 4-0 Monocryl. Benzoin Steri-Strips were applied. The patient was then extubated and brought to the recovery room in stable condition. All sponge, instrument, and needle counts are correct.  [name]. Georgette Dover, MD, [facility] Surgery  General/ Trauma Surgery  08/14/2021 8:22 AM

## 2021-08-19 ENCOUNTER — Encounter (HOSPITAL_BASED_OUTPATIENT_CLINIC_OR_DEPARTMENT_OTHER): Payer: Self-pay | Admitting: Surgery

## 2021-08-20 DIAGNOSIS — J4550 Severe persistent asthma, uncomplicated: Secondary | ICD-10-CM | POA: Diagnosis not present

## 2021-08-20 DIAGNOSIS — I10 Essential (primary) hypertension: Secondary | ICD-10-CM | POA: Diagnosis not present

## 2021-08-20 DIAGNOSIS — E1140 Type 2 diabetes mellitus with diabetic neuropathy, unspecified: Secondary | ICD-10-CM | POA: Diagnosis not present

## 2021-08-20 DIAGNOSIS — J4520 Mild intermittent asthma, uncomplicated: Secondary | ICD-10-CM | POA: Diagnosis not present

## 2021-08-20 DIAGNOSIS — E7800 Pure hypercholesterolemia, unspecified: Secondary | ICD-10-CM | POA: Diagnosis not present

## 2021-08-20 DIAGNOSIS — E1165 Type 2 diabetes mellitus with hyperglycemia: Secondary | ICD-10-CM | POA: Diagnosis not present

## 2021-08-20 DIAGNOSIS — E119 Type 2 diabetes mellitus without complications: Secondary | ICD-10-CM | POA: Diagnosis not present

## 2021-08-22 LAB — SURGICAL PATHOLOGY

## 2021-09-16 DIAGNOSIS — Z23 Encounter for immunization: Secondary | ICD-10-CM | POA: Diagnosis not present

## 2021-09-23 ENCOUNTER — Non-Acute Institutional Stay (HOSPITAL_COMMUNITY)
Admission: RE | Admit: 2021-09-23 | Discharge: 2021-09-23 | Disposition: A | Discharge status: Home / Self Care | Payer: Medicare Other | Source: Ambulatory Visit | Attending: Internal Medicine | Admitting: Internal Medicine

## 2021-09-23 ENCOUNTER — Other Ambulatory Visit: Payer: Self-pay

## 2021-09-23 DIAGNOSIS — K5090 Crohn's disease, unspecified, without complications: Secondary | ICD-10-CM | POA: Insufficient documentation

## 2021-09-23 MED ORDER — SODIUM CHLORIDE 0.9 % IV SOLN: INTRAVENOUS | Status: DC | PRN

## 2021-09-23 MED ORDER — ACETAMINOPHEN 325 MG PO TABS
650.0000 mg | ORAL_TABLET | Freq: Once | ORAL | Status: AC
  Administered 2021-09-23: 650 mg via ORAL
  Filled 2021-09-23: qty 2

## 2021-09-23 MED ORDER — SODIUM CHLORIDE 0.9 % IV SOLN
400.0000 mg | INTRAVENOUS | Status: DC
  Administered 2021-09-23: 400 mg via INTRAVENOUS
  Filled 2021-09-23: qty 40

## 2021-09-23 MED ORDER — METHYLPREDNISOLONE SODIUM SUCC 40 MG IJ SOLR
20.0000 mg | Freq: Once | INTRAMUSCULAR | Status: AC
  Administered 2021-09-23: 20 mg via INTRAVENOUS
  Filled 2021-09-23: qty 1

## 2021-09-23 NOTE — Progress Notes (Signed)
PATIENT CARE CENTER NOTE   Diagnosis: Crohn's disease     Provider: Wilford Corner  MD     Procedure: Remicade infusion 400 mg     Note: Patient received Remicade infusion via PIV. Patient premedicated with Tylenol and Solumedrol per orders. Infusion titrated per protocol. Patient tolerated well with no adverse reaction.Pt declined to stay for 1 hour observation after infusion completed. Vital signs stable. Discharge instructions given. Patient to come back every 8 weeks for infusion, instructed to schedule next appointment prior to leaving, pt verbalized understanding . Alert, oriented and ambulatory at discharge.

## 2021-10-21 DIAGNOSIS — E1165 Type 2 diabetes mellitus with hyperglycemia: Secondary | ICD-10-CM | POA: Diagnosis not present

## 2021-10-22 DIAGNOSIS — I10 Essential (primary) hypertension: Secondary | ICD-10-CM | POA: Diagnosis not present

## 2021-10-22 DIAGNOSIS — J4520 Mild intermittent asthma, uncomplicated: Secondary | ICD-10-CM | POA: Diagnosis not present

## 2021-10-22 DIAGNOSIS — J4550 Severe persistent asthma, uncomplicated: Secondary | ICD-10-CM | POA: Diagnosis not present

## 2021-10-22 DIAGNOSIS — E7800 Pure hypercholesterolemia, unspecified: Secondary | ICD-10-CM | POA: Diagnosis not present

## 2021-10-22 DIAGNOSIS — E1140 Type 2 diabetes mellitus with diabetic neuropathy, unspecified: Secondary | ICD-10-CM | POA: Diagnosis not present

## 2021-10-22 DIAGNOSIS — E1165 Type 2 diabetes mellitus with hyperglycemia: Secondary | ICD-10-CM | POA: Diagnosis not present

## 2021-10-22 DIAGNOSIS — E119 Type 2 diabetes mellitus without complications: Secondary | ICD-10-CM | POA: Diagnosis not present

## 2021-10-27 ENCOUNTER — Other Ambulatory Visit: Payer: Medicare Other

## 2021-10-27 ENCOUNTER — Ambulatory Visit: Payer: Medicare Other | Admitting: Oncology

## 2021-10-27 ENCOUNTER — Inpatient Hospital Stay: Payer: Medicare Other

## 2021-10-27 ENCOUNTER — Other Ambulatory Visit (HOSPITAL_BASED_OUTPATIENT_CLINIC_OR_DEPARTMENT_OTHER): Payer: Self-pay

## 2021-10-27 ENCOUNTER — Inpatient Hospital Stay: Payer: Medicare Other | Attending: Oncology | Admitting: Oncology

## 2021-10-27 ENCOUNTER — Other Ambulatory Visit: Payer: Self-pay

## 2021-10-27 ENCOUNTER — Ambulatory Visit: Payer: Medicare Other | Attending: Internal Medicine

## 2021-10-27 VITALS — BP 134/70 | HR 70 | Temp 97.8°F | Resp 20 | Ht 64.0 in | Wt 201.6 lb

## 2021-10-27 DIAGNOSIS — C884 Extranodal marginal zone B-cell lymphoma of mucosa-associated lymphoid tissue [MALT-lymphoma]: Secondary | ICD-10-CM | POA: Diagnosis not present

## 2021-10-27 DIAGNOSIS — K5090 Crohn's disease, unspecified, without complications: Secondary | ICD-10-CM | POA: Diagnosis not present

## 2021-10-27 DIAGNOSIS — J45909 Unspecified asthma, uncomplicated: Secondary | ICD-10-CM | POA: Diagnosis not present

## 2021-10-27 DIAGNOSIS — K922 Gastrointestinal hemorrhage, unspecified: Secondary | ICD-10-CM | POA: Insufficient documentation

## 2021-10-27 DIAGNOSIS — C8519 Unspecified B-cell lymphoma, extranodal and solid organ sites: Secondary | ICD-10-CM

## 2021-10-27 DIAGNOSIS — D638 Anemia in other chronic diseases classified elsewhere: Secondary | ICD-10-CM | POA: Insufficient documentation

## 2021-10-27 DIAGNOSIS — Z23 Encounter for immunization: Secondary | ICD-10-CM

## 2021-10-27 LAB — CMP (CANCER CENTER ONLY)
ALT: 28 U/L (ref 0–44)
AST: 23 U/L (ref 15–41)
Albumin: 4.2 g/dL (ref 3.5–5.0)
Alkaline Phosphatase: 70 U/L (ref 38–126)
Anion gap: 8 (ref 5–15)
BUN: 15 mg/dL (ref 8–23)
CO2: 30 mmol/L (ref 22–32)
Calcium: 10.1 mg/dL (ref 8.9–10.3)
Chloride: 102 mmol/L (ref 98–111)
Creatinine: 0.86 mg/dL (ref 0.44–1.00)
GFR, Estimated: >60 mL/min (ref >60)
Glucose, Bld: 234 mg/dL — ABNORMAL HIGH (ref 70–99)
Potassium: 4.1 mmol/L (ref 3.5–5.1)
Sodium: 140 mmol/L (ref 135–145)
Total Bilirubin: 0.8 mg/dL (ref 0.3–1.2)
Total Protein: 7.7 g/dL (ref 6.5–8.1)

## 2021-10-27 LAB — CBC WITH DIFFERENTIAL (CANCER CENTER ONLY)
Abs Immature Granulocytes: 0.01 10*3/uL (ref 0.00–0.07)
Basophils Absolute: 0 10*3/uL (ref 0.0–0.1)
Basophils Relative: 1 %
Eosinophils Absolute: 0.3 10*3/uL (ref 0.0–0.5)
Eosinophils Relative: 4 %
HCT: 42.9 % (ref 36.0–46.0)
Hemoglobin: 14.1 g/dL (ref 12.0–15.0)
Immature Granulocytes: 0 %
Lymphocytes Relative: 34 %
Lymphs Abs: 2.3 10*3/uL (ref 0.7–4.0)
MCH: 33.3 pg (ref 26.0–34.0)
MCHC: 32.9 g/dL (ref 30.0–36.0)
MCV: 101.2 fL — ABNORMAL HIGH (ref 80.0–100.0)
Monocytes Absolute: 0.5 10*3/uL (ref 0.1–1.0)
Monocytes Relative: 8 %
Neutro Abs: 3.6 10*3/uL (ref 1.7–7.7)
Neutrophils Relative %: 53 %
Platelet Count: 247 10*3/uL (ref 150–400)
RBC: 4.24 MIL/uL (ref 3.87–5.11)
RDW: 13.7 % (ref 11.5–15.5)
WBC Count: 6.8 10*3/uL (ref 4.0–10.5)
nRBC: 0 % (ref 0.0–0.2)

## 2021-10-27 MED ORDER — PFIZER COVID-19 VAC BIVALENT 30 MCG/0.3ML IM SUSP
INTRAMUSCULAR | 0 refills | Status: DC
  Filled 2021-10-27: qty 0.3, 1d supply, fill #0

## 2021-10-27 NOTE — Progress Notes (Signed)
  [facility] OFFICE PROGRESS NOTE   Diagnosis: MALT lymphoma  INTERVAL HISTORY:   [name] returns as scheduled.  She feels well.  She reports the colitis is improved while on Remicade.  She had distortion with loosely grouped calcifications in the lower inner left breast on a mammogram in July.  She underwent a stereotactic biopsy on 06/16/2021 with the pathology revealing a complex sclerosing lesion with usual ductal hyperplasia, apical metaplasia, and calcifications.  No carcinoma. She underwent a radioactive seed localized lumpectomy by Dr. Georgette Dover on 08/14/2021.  The pathology revealed a complex sclerosing lesion with focal atypical ductal hyperplasia.  No malignancy.   Objective:  Vital signs in last 24 hours:  Blood pressure 134/70, pulse 70, temperature 97.8 F (36.6 C), temperature source Oral, resp. rate 20, height 5' 4"  (1.626 m), weight 201 lb 9.6 oz (91.4 kg), SpO2 100 %.    Lymphatics: No cervical, supraclavicular, axillary, or inguinal nodes Resp: Scattered inspiratory/expiratory rhonchi and wheeze, no respiratory distress Cardio: Regular rate and rhythm GI: No hepatosplenomegaly Vascular: No leg edema Breast: Healed surgical incision at the inferior left breast Skin: Keloid formation at the right thoracotomy scar  Lab Results:  Lab Results  Component Value Date   WBC 6.8 10/27/2021   HGB 14.1 10/27/2021   HCT 42.9 10/27/2021   MCV 101.2 (H) 10/27/2021   PLT 247 10/27/2021   NEUTROABS 3.6 10/27/2021    CMP  Lab Results  Component Value Date   NA 140 10/27/2021   K 4.1 10/27/2021   CL 102 10/27/2021   CO2 30 10/27/2021   GLUCOSE 234 (H) 10/27/2021   BUN 15 10/27/2021   CREATININE 0.86 10/27/2021   CALCIUM 10.1 10/27/2021   PROT 7.7 10/27/2021   ALBUMIN 4.2 10/27/2021   AST 23 10/27/2021   ALT 28 10/27/2021   ALKPHOS 70 10/27/2021   BILITOT 0.8 10/27/2021   GFRNONAA >60 10/27/2021   GFRAA >60 10/30/2019   Medications: I have  reviewed the patient's current medications.   Assessment/Plan: Non-Hodgkin lymphoma, low grade lymphoma involving the lungs, diagnosed in June 2006. No clinical evidence of progression. Crohn's disease, followed by Dr. Michail Sermon.  She continues Remicade Post thoracotomy pain, persistent, initially improved with Elavil, now taking gabapentin Anemia secondary to chronic disease and gastrointestinal bleeding. Hemoglobin is improved. History of thrombocytosis. History of anorexia/weight loss in the setting of active Crohn disease. Improved. History of a serum monoclonal protein, stable on repeat serum protein electrophoresis studies, last in  December 2019 History of asthma. . Area of distortion/calcifications in the lower inner left breast July 2022-seed localized lumpectomy 08/14/2021-complex sclerosing lesion with focal atypical ductal hyperplasia, no malignancy     Disposition: Ms. Portugal remains in clinical remission from [place].  The hemoglobin is normal.  We will follow-up on the serum protein electrophoresis from today.  She will return for an office and lab visit in 1 year.  [name], MD  10/27/2021  1:30 PM

## 2021-10-27 NOTE — Progress Notes (Signed)
   Covid-19 Vaccination Clinic  Name:  [name]    MRN: [id number] DOB: 02-Jan-1951  10/27/2021  [name] was observed post Covid-19 immunization for 15 minutes without incident. She was provided with Vaccine Information Sheet and instruction to access the V-Safe system.   [name] was instructed to call 911 with any severe reactions post vaccine: Difficulty breathing  Swelling of face and throat  A fast heartbeat  A bad rash all over body  Dizziness and weakness   Immunizations Administered     Name Date Dose VIS Date Route   Pfizer Covid-19 Vaccine Bivalent Booster 10/27/2021 12:32 PM 0.3 mL 07/23/2021 Intramuscular   Manufacturer: [place]   Lot: [id number]   [place]: [phone]

## 2021-10-29 LAB — PROTEIN ELECTROPHORESIS, SERUM
A/G Ratio: 1 (ref 0.7–1.7)
Albumin ELP: 3.8 g/dL (ref 2.9–4.4)
Alpha-1-Globulin: 0.2 g/dL (ref 0.0–0.4)
Alpha-2-Globulin: 0.7 g/dL (ref 0.4–1.0)
Beta Globulin: 1.2 g/dL (ref 0.7–1.3)
Gamma Globulin: 1.6 g/dL (ref 0.4–1.8)
Globulin, Total: 3.8 g/dL (ref 2.2–3.9)
M-Spike, %: 0.8 g/dL — ABNORMAL HIGH
Total Protein ELP: 7.6 g/dL (ref 6.0–8.5)

## 2021-10-30 ENCOUNTER — Telehealth: Payer: Self-pay

## 2021-10-30 ENCOUNTER — Other Ambulatory Visit: Payer: Self-pay | Admitting: Oncology

## 2021-10-30 DIAGNOSIS — C8519 Unspecified B-cell lymphoma, extranodal and solid organ sites: Secondary | ICD-10-CM

## 2021-10-30 NOTE — Telephone Encounter (Signed)
Pt verbalized understanding.

## 2021-10-30 NOTE — Telephone Encounter (Signed)
-----   Message from Ladell Pier, MD sent at 10/30/2021 10:41 AM EST ----- Please call patient, serum monoclonal protein level is stable, follow-up as scheduled

## 2021-11-18 ENCOUNTER — Non-Acute Institutional Stay (HOSPITAL_COMMUNITY)
Admission: RE | Admit: 2021-11-18 | Discharge: 2021-11-18 | Disposition: A | Discharge status: Home / Self Care | Payer: Medicare Other | Source: Ambulatory Visit | Attending: Internal Medicine | Admitting: Internal Medicine

## 2021-11-18 ENCOUNTER — Other Ambulatory Visit: Payer: Self-pay

## 2021-11-18 DIAGNOSIS — K5010 Crohn's disease of large intestine without complications: Secondary | ICD-10-CM | POA: Diagnosis not present

## 2021-11-18 MED ORDER — ACETAMINOPHEN 325 MG PO TABS
650.0000 mg | ORAL_TABLET | Freq: Once | ORAL | Status: AC
  Administered 2021-11-18: 11:00:00 650 mg via ORAL
  Filled 2021-11-18: qty 2

## 2021-11-18 MED ORDER — METHYLPREDNISOLONE SODIUM SUCC 40 MG IJ SOLR
20.0000 mg | Freq: Once | INTRAMUSCULAR | Status: AC
  Administered 2021-11-18: 11:00:00 20 mg via INTRAVENOUS
  Filled 2021-11-18: qty 1

## 2021-11-18 MED ORDER — SODIUM CHLORIDE 0.9 % IV SOLN: INTRAVENOUS | Status: DC | PRN

## 2021-11-18 MED ORDER — SODIUM CHLORIDE 0.9 % IV SOLN
400.0000 mg | INTRAVENOUS | Status: DC
  Administered 2021-11-18: 11:00:00 400 mg via INTRAVENOUS
  Filled 2021-11-18: qty 40

## 2021-11-18 NOTE — Progress Notes (Signed)
PATIENT CARE CENTER NOTE     Diagnosis: Crohn's Disease     Provider: Wilford Corner, MD     Procedure: IV Remicade      Note: Patient received Remicade infusion via PIV. Pre-medications given per order (Tylenol and Solumedrol). Infusion titrated per protocol. Patient tolerated well with no adverse reaction. Vital signs remained stable. Discharge instructions given. Patient to come back in 8 weeks for next infusion. Alert, oriented and ambulatory at discharge.

## 2021-12-24 IMAGING — MG DIGITAL SCREENING BILAT W/ TOMO W/ CAD
6 of 10 series · 6 of 30 positions shown · non-contrast
Comparison: Previous exam(s).

CLINICAL DATA: Screening.

EXAM:
DIGITAL SCREENING BILATERAL MAMMOGRAM WITH TOMO AND CAD

[L CC synth-2D]
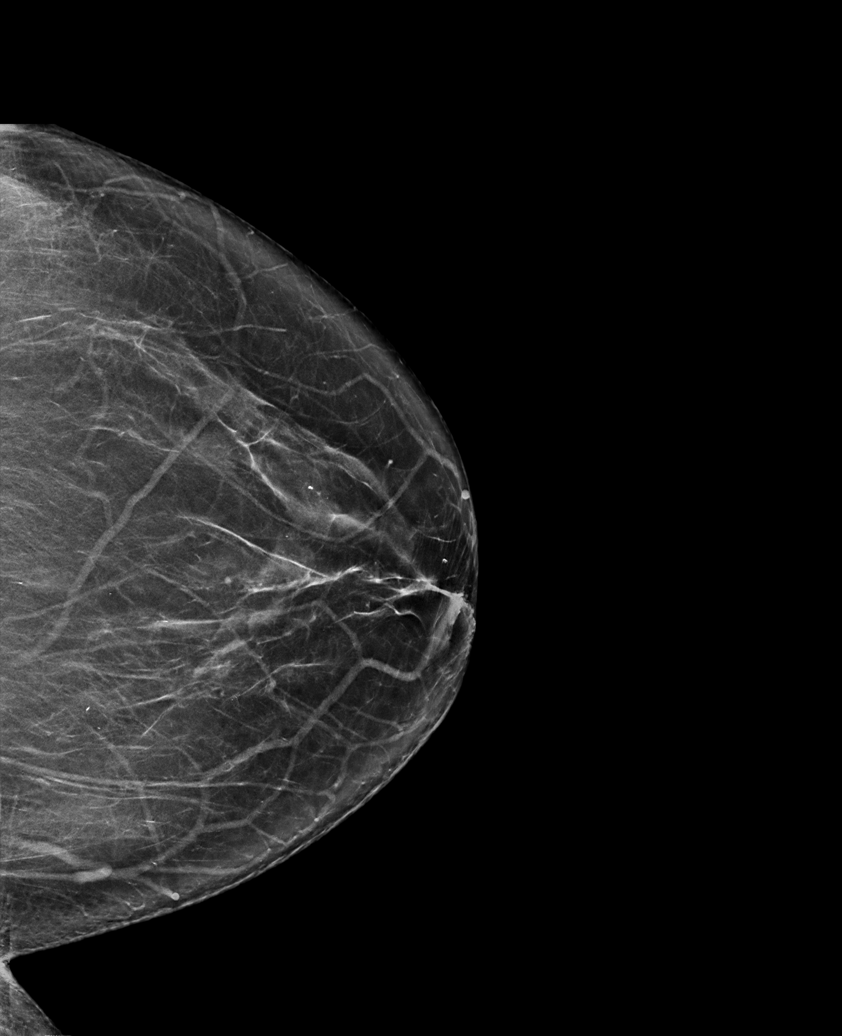

[L MLO synth-2D]
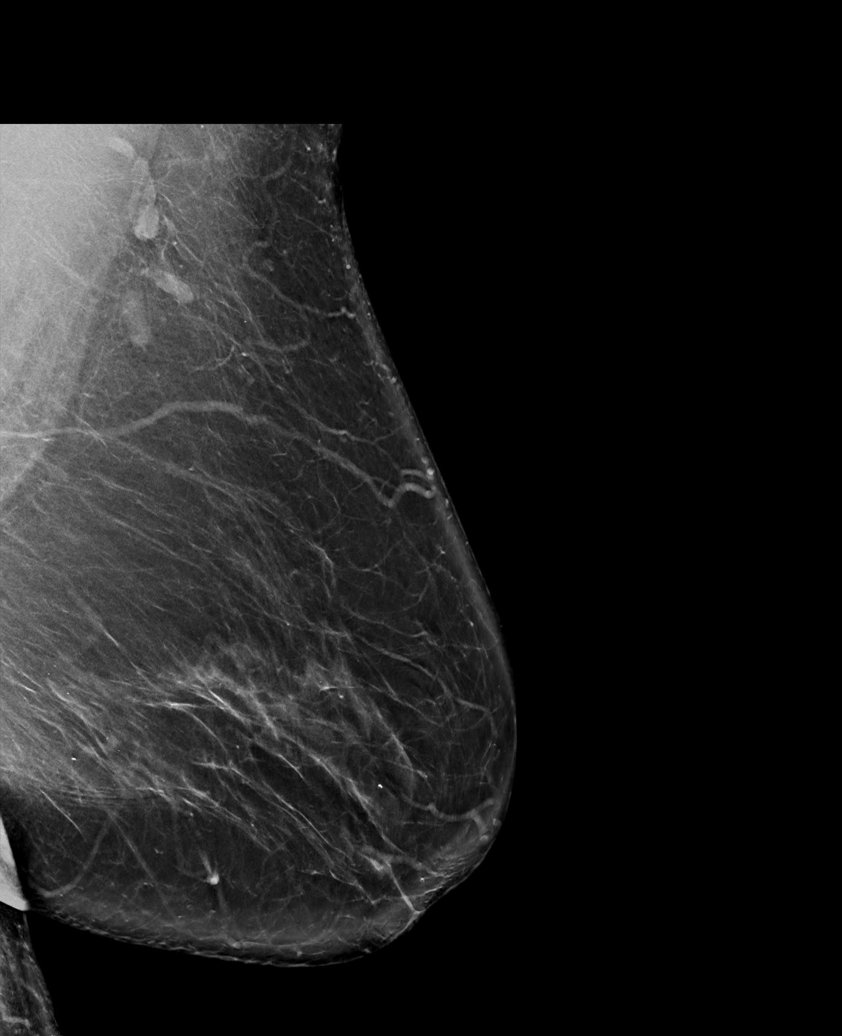

[R CC synth-2D]
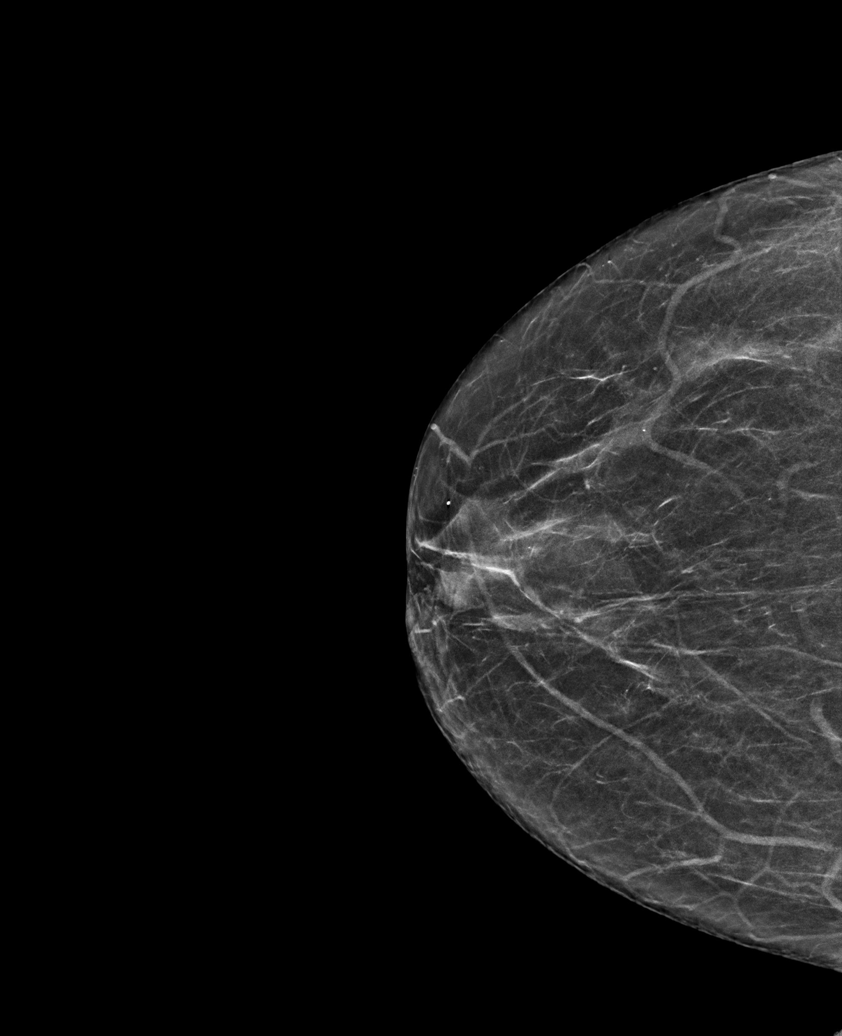

[R MLO synth-2D (1 of 2)]
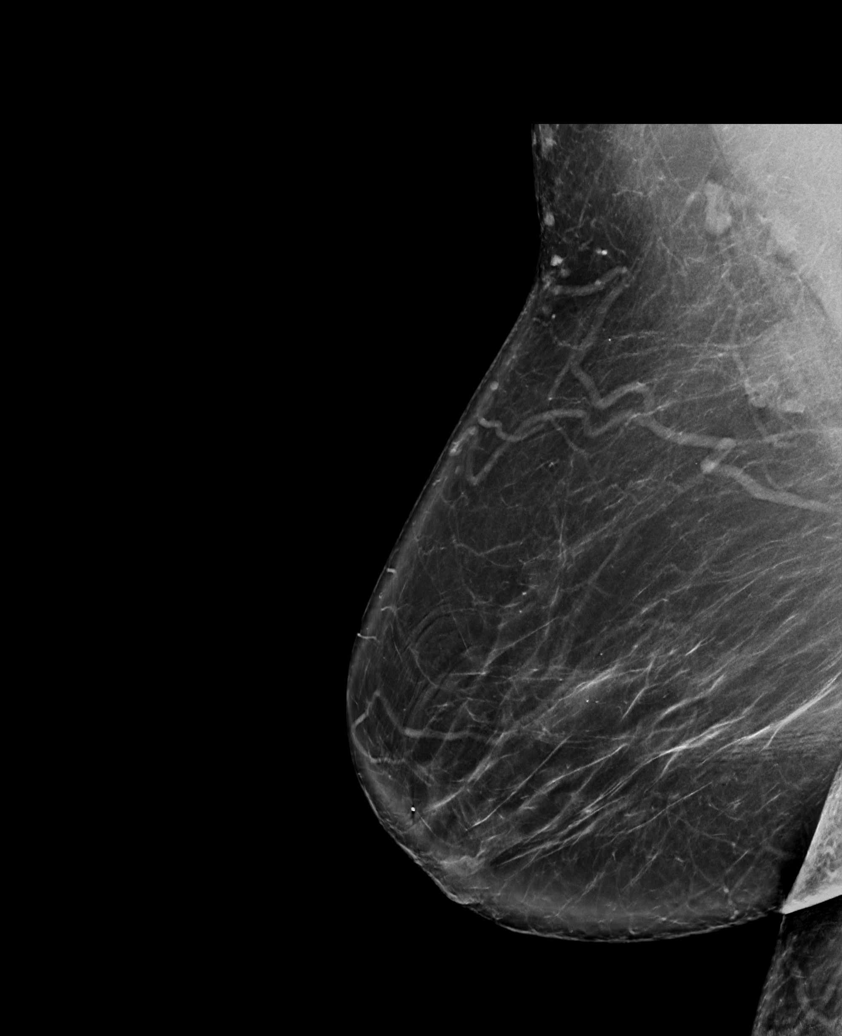

[R MLO synth-2D (2 of 2)]
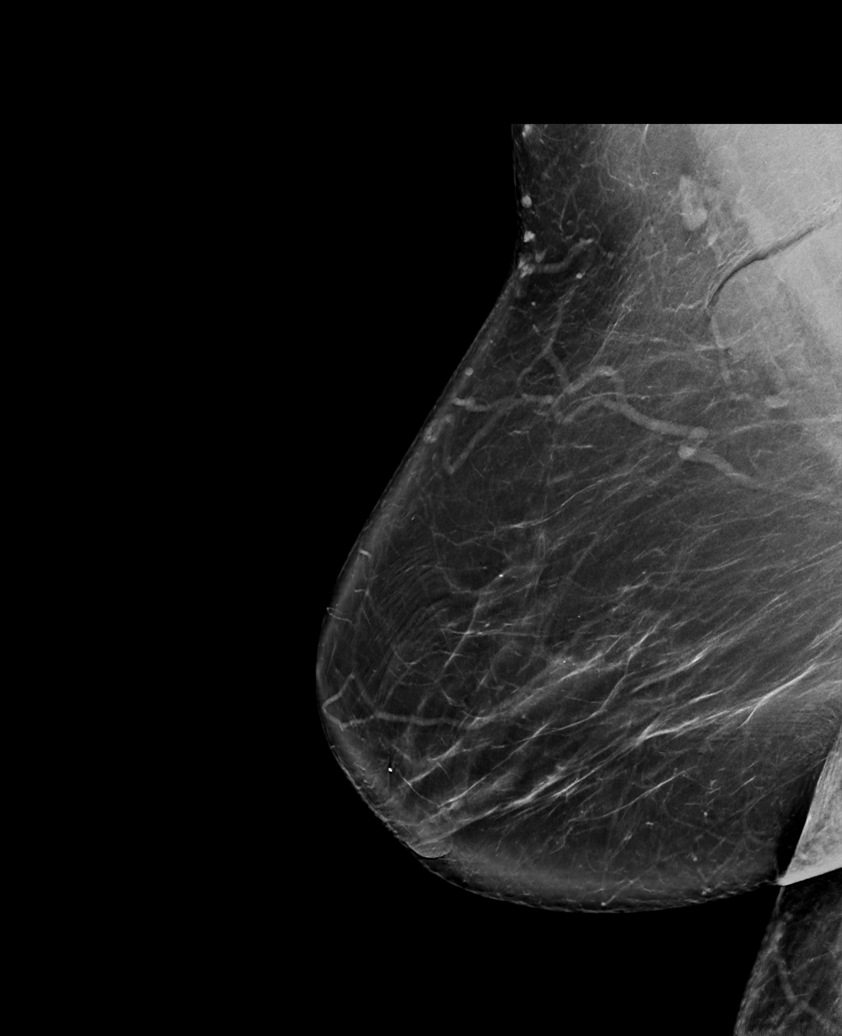

[L MLO tomo · tomo slice 45/88.0]
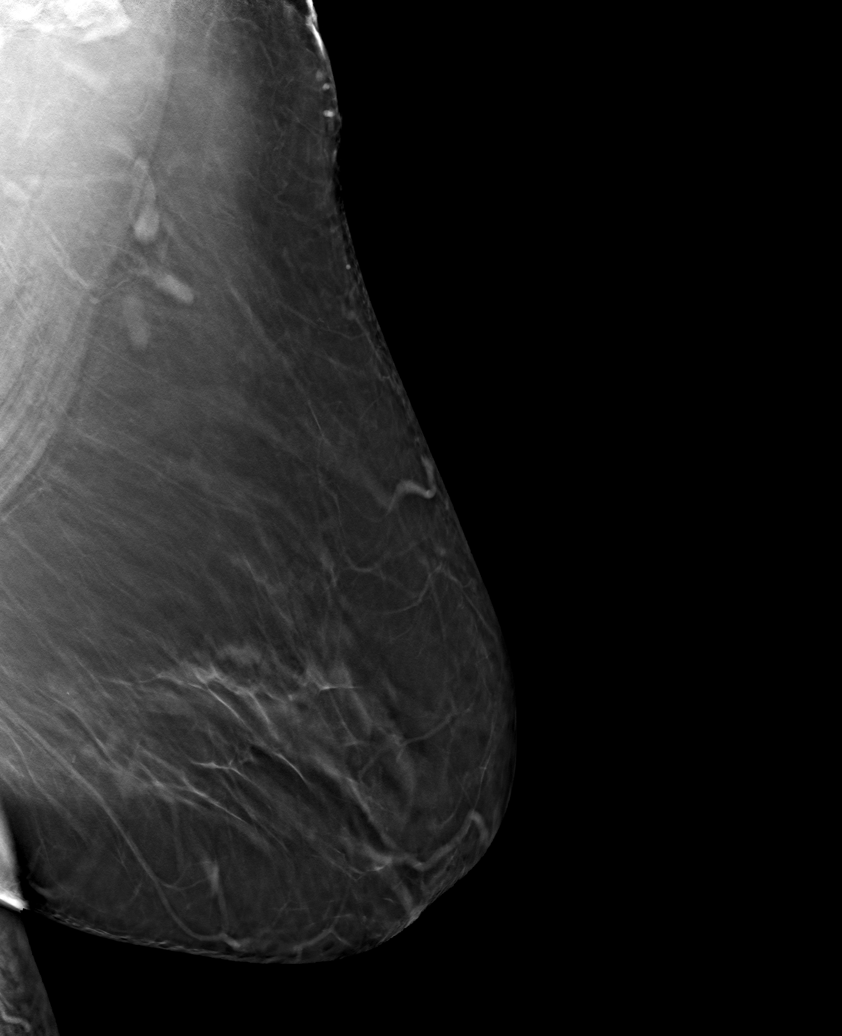

[6 of 30 positions shown; findings below may reference images not displayed]

ACR Breast Density Category b: There are scattered areas of
fibroglandular density.
FINDINGS: There are no findings suspicious for malignancy. Images were
processed with CAD.
IMPRESSION: No mammographic evidence of malignancy. A result letter of this
screening mammogram will be mailed directly to the patient.

RECOMMENDATION:
Screening mammogram in one year. (Code:[id number])

BI-RADS CATEGORY  1: Negative.

## 2022-01-06 DIAGNOSIS — Z7984 Long term (current) use of oral hypoglycemic drugs: Secondary | ICD-10-CM | POA: Diagnosis not present

## 2022-01-06 DIAGNOSIS — J4550 Severe persistent asthma, uncomplicated: Secondary | ICD-10-CM | POA: Diagnosis not present

## 2022-01-06 DIAGNOSIS — Z0000 Encounter for general adult medical examination without abnormal findings: Secondary | ICD-10-CM | POA: Diagnosis not present

## 2022-01-06 DIAGNOSIS — C8590 Non-Hodgkin lymphoma, unspecified, unspecified site: Secondary | ICD-10-CM | POA: Diagnosis not present

## 2022-01-06 DIAGNOSIS — Z79899 Other long term (current) drug therapy: Secondary | ICD-10-CM | POA: Diagnosis not present

## 2022-01-06 DIAGNOSIS — E1140 Type 2 diabetes mellitus with diabetic neuropathy, unspecified: Secondary | ICD-10-CM | POA: Diagnosis not present

## 2022-01-06 DIAGNOSIS — I10 Essential (primary) hypertension: Secondary | ICD-10-CM | POA: Diagnosis not present

## 2022-01-06 DIAGNOSIS — E1165 Type 2 diabetes mellitus with hyperglycemia: Secondary | ICD-10-CM | POA: Diagnosis not present

## 2022-01-06 DIAGNOSIS — E7800 Pure hypercholesterolemia, unspecified: Secondary | ICD-10-CM | POA: Diagnosis not present

## 2022-01-06 DIAGNOSIS — G4730 Sleep apnea, unspecified: Secondary | ICD-10-CM | POA: Diagnosis not present

## 2022-01-06 DIAGNOSIS — J309 Allergic rhinitis, unspecified: Secondary | ICD-10-CM | POA: Diagnosis not present

## 2022-01-06 DIAGNOSIS — E559 Vitamin D deficiency, unspecified: Secondary | ICD-10-CM | POA: Diagnosis not present

## 2022-01-13 ENCOUNTER — Non-Acute Institutional Stay (HOSPITAL_COMMUNITY)
Admission: RE | Admit: 2022-01-13 | Discharge: 2022-01-13 | Disposition: A | Discharge status: Home / Self Care | Payer: Medicare Other | Source: Ambulatory Visit | Attending: Internal Medicine | Admitting: Internal Medicine

## 2022-01-13 ENCOUNTER — Other Ambulatory Visit: Payer: Self-pay

## 2022-01-13 DIAGNOSIS — K5090 Crohn's disease, unspecified, without complications: Secondary | ICD-10-CM | POA: Insufficient documentation

## 2022-01-13 MED ORDER — METHYLPREDNISOLONE SODIUM SUCC 40 MG IJ SOLR
20.0000 mg | Freq: Once | INTRAMUSCULAR | Status: AC
  Administered 2022-01-13: 20 mg via INTRAVENOUS
  Filled 2022-01-13: qty 1

## 2022-01-13 MED ORDER — ACETAMINOPHEN 325 MG PO TABS
650.0000 mg | ORAL_TABLET | Freq: Once | ORAL | Status: AC
  Administered 2022-01-13: 650 mg via ORAL
  Filled 2022-01-13: qty 2

## 2022-01-13 MED ORDER — SODIUM CHLORIDE 0.9 % IV SOLN
400.0000 mg | INTRAVENOUS | Status: DC
  Administered 2022-01-13: 400 mg via INTRAVENOUS
  Filled 2022-01-13: qty 40

## 2022-01-13 MED ORDER — SODIUM CHLORIDE 0.9 % IV SOLN: INTRAVENOUS | Status: DC | PRN

## 2022-01-13 NOTE — Progress Notes (Signed)
PATIENT CARE CENTER NOTE   Diagnosis: Crohn's disease     Provider: Wilford Corner MD     Procedure: Remicade 467m     Note: Patient received Remicade infusion via PIV. Patient premedicated with Tylenol PO and Solumedrol IV as ordered. Infusion titrated per protocol. Patient tolerated well with no adverse reaction. Pt declined to stay for 1 hour observation post infusion. Vital signs stable. Discharge instructions given. Patient to come back every 8 weeks for infusion, instructed to schedule next appointment at front desk prior to leaving, pt verbalized understanding. Alert, oriented and ambulatory at discharge.

## 2022-01-13 NOTE — Progress Notes (Addendum)
Error

## 2022-01-28 DIAGNOSIS — R062 Wheezing: Secondary | ICD-10-CM | POA: Diagnosis not present

## 2022-01-28 DIAGNOSIS — J300 Vasomotor rhinitis: Secondary | ICD-10-CM | POA: Diagnosis not present

## 2022-03-10 ENCOUNTER — Non-Acute Institutional Stay (HOSPITAL_COMMUNITY)
Admission: RE | Admit: 2022-03-10 | Discharge: 2022-03-10 | Disposition: A | Discharge status: Home / Self Care | Payer: Medicare Other | Source: Ambulatory Visit | Attending: Internal Medicine | Admitting: Internal Medicine

## 2022-03-10 DIAGNOSIS — K5090 Crohn's disease, unspecified, without complications: Secondary | ICD-10-CM | POA: Insufficient documentation

## 2022-03-10 MED ORDER — SODIUM CHLORIDE 0.9 % IV SOLN
400.0000 mg | INTRAVENOUS | Status: DC
  Administered 2022-03-10: 400 mg via INTRAVENOUS
  Filled 2022-03-10: qty 40

## 2022-03-10 MED ORDER — METHYLPREDNISOLONE SODIUM SUCC 40 MG IJ SOLR
20.0000 mg | Freq: Once | INTRAMUSCULAR | Status: AC
  Administered 2022-03-10: 20 mg via INTRAVENOUS
  Filled 2022-03-10: qty 1

## 2022-03-10 MED ORDER — ACETAMINOPHEN 325 MG PO TABS
650.0000 mg | ORAL_TABLET | Freq: Once | ORAL | Status: AC
  Administered 2022-03-10: 650 mg via ORAL
  Filled 2022-03-10: qty 2

## 2022-03-10 MED ORDER — SODIUM CHLORIDE 0.9 % IV SOLN: INTRAVENOUS | Status: DC | PRN

## 2022-03-10 NOTE — Progress Notes (Signed)
PATIENT CARE CENTER NOTE ?  ?  ?Diagnosis: Crohn's Disease ?  ?  ?Provider: Wilford Corner, MD ?  ?  ?Procedure: IV Remicade  ?  ?  ?Note: Patient received Remicade infusion via PIV. Pre-medications given per order (Tylenol and Solumedrol). Infusion titrated per protocol. Patient tolerated well with no adverse reaction. Vital signs remained stable. Discharge instructions given. Patient to come back in 8 weeks for next infusion. Alert, oriented and ambulatory at discharge. ?

## 2022-03-11 ENCOUNTER — Encounter (HOSPITAL_COMMUNITY): Payer: Self-pay

## 2022-05-05 ENCOUNTER — Non-Acute Institutional Stay (HOSPITAL_COMMUNITY)
Admission: RE | Admit: 2022-05-05 | Discharge: 2022-05-05 | Disposition: A | Discharge status: Home / Self Care | Payer: Medicare Other | Source: Ambulatory Visit | Attending: Internal Medicine | Admitting: Internal Medicine

## 2022-05-05 DIAGNOSIS — K5090 Crohn's disease, unspecified, without complications: Secondary | ICD-10-CM | POA: Insufficient documentation

## 2022-05-05 MED ORDER — ACETAMINOPHEN 325 MG PO TABS
650.0000 mg | ORAL_TABLET | Freq: Once | ORAL | Status: AC
  Administered 2022-05-05: 650 mg via ORAL
  Filled 2022-05-05: qty 2

## 2022-05-05 MED ORDER — SODIUM CHLORIDE 0.9 % IV SOLN: INTRAVENOUS | Status: DC | PRN

## 2022-05-05 MED ORDER — METHYLPREDNISOLONE SODIUM SUCC 40 MG IJ SOLR
20.0000 mg | Freq: Once | INTRAMUSCULAR | Status: AC
  Administered 2022-05-05: 20 mg via INTRAVENOUS
  Filled 2022-05-05: qty 0.5

## 2022-05-05 MED ORDER — SODIUM CHLORIDE 0.9 % IV SOLN
400.0000 mg | INTRAVENOUS | Status: DC
  Administered 2022-05-05: 400 mg via INTRAVENOUS
  Filled 2022-05-05: qty 40

## 2022-05-05 NOTE — Progress Notes (Addendum)
PATIENT CARE CENTER NOTE   Diagnosis: Crohn's disease     Provider: Wilford Corner, MD     Procedure: Remicade 49m     Note: Patient received Remicade infusion via PIV. Pt premedicated with Tylenol PO and Solumedrol IV per orders. Infusion titrated per protocol. Patient tolerated well with no adverse reaction. Vital signs stable. Pt declined AVS. Pt declined to stay for 1 hour observation after infusion completed. Patient to come back every 8 weeks for infusion, instructed to schedule next appointment at front desk prior to leaving, verbalized understanding. Alert, oriented and ambulatory at discharge.

## 2022-06-30 ENCOUNTER — Encounter (HOSPITAL_COMMUNITY): Payer: Medicare Other

## 2022-07-07 DIAGNOSIS — C8590 Non-Hodgkin lymphoma, unspecified, unspecified site: Secondary | ICD-10-CM | POA: Diagnosis not present

## 2022-07-07 DIAGNOSIS — E559 Vitamin D deficiency, unspecified: Secondary | ICD-10-CM | POA: Diagnosis not present

## 2022-07-07 DIAGNOSIS — J4550 Severe persistent asthma, uncomplicated: Secondary | ICD-10-CM | POA: Diagnosis not present

## 2022-07-07 DIAGNOSIS — G4730 Sleep apnea, unspecified: Secondary | ICD-10-CM | POA: Diagnosis not present

## 2022-07-07 DIAGNOSIS — E1140 Type 2 diabetes mellitus with diabetic neuropathy, unspecified: Secondary | ICD-10-CM | POA: Diagnosis not present

## 2022-07-07 DIAGNOSIS — J309 Allergic rhinitis, unspecified: Secondary | ICD-10-CM | POA: Diagnosis not present

## 2022-07-07 DIAGNOSIS — I10 Essential (primary) hypertension: Secondary | ICD-10-CM | POA: Diagnosis not present

## 2022-07-07 DIAGNOSIS — E1165 Type 2 diabetes mellitus with hyperglycemia: Secondary | ICD-10-CM | POA: Diagnosis not present

## 2022-07-07 DIAGNOSIS — R079 Chest pain, unspecified: Secondary | ICD-10-CM | POA: Diagnosis not present

## 2022-07-08 ENCOUNTER — Non-Acute Institutional Stay (HOSPITAL_COMMUNITY)
Admission: RE | Admit: 2022-07-08 | Discharge: 2022-07-08 | Disposition: A | Discharge status: Home / Self Care | Payer: Medicare Other | Source: Ambulatory Visit | Attending: Internal Medicine | Admitting: Internal Medicine

## 2022-07-08 DIAGNOSIS — K5090 Crohn's disease, unspecified, without complications: Secondary | ICD-10-CM | POA: Insufficient documentation

## 2022-07-08 MED ORDER — SODIUM CHLORIDE 0.9 % IV SOLN
400.0000 mg | INTRAVENOUS | Status: DC
  Administered 2022-07-08: 400 mg via INTRAVENOUS
  Filled 2022-07-08: qty 40

## 2022-07-08 MED ORDER — SODIUM CHLORIDE 0.9 % IV SOLN: INTRAVENOUS | Status: DC | PRN

## 2022-07-08 MED ORDER — ACETAMINOPHEN 325 MG PO TABS
650.0000 mg | ORAL_TABLET | Freq: Once | ORAL | Status: AC
  Administered 2022-07-08: 650 mg via ORAL
  Filled 2022-07-08: qty 2

## 2022-07-08 MED ORDER — METHYLPREDNISOLONE SODIUM SUCC 40 MG IJ SOLR
20.0000 mg | Freq: Once | INTRAMUSCULAR | Status: AC
Start: 2022-07-08 — End: 2022-07-08
  Administered 2022-07-08: 20 mg via INTRAVENOUS
  Filled 2022-07-08: qty 0.5

## 2022-07-08 NOTE — Progress Notes (Addendum)
PATIENT CARE CENTER NOTE:   Diagnosis: Crohn's disease     Provider: Wilford Corner MD     Procedure: Remicade 473m infusion     Note: Patient received Remicade infusion via PIV. Patient premedicated with 276mSolumedrol IV and 65060mylenol PO. Infusion titrated per protocol. Patient tolerated well with no adverse reaction. Vital signs stable. Pt declined to wait for observation after infusion. Pt declined AVS.Patient to come back every 8 weeks for infusion, instructed pt to schedule next appointment at front desk prior to leaving,  verbalized understanding.  Alert, oriented and ambulatory at discharge.

## 2022-08-04 DIAGNOSIS — E1165 Type 2 diabetes mellitus with hyperglycemia: Secondary | ICD-10-CM | POA: Diagnosis not present

## 2022-08-04 DIAGNOSIS — R079 Chest pain, unspecified: Secondary | ICD-10-CM | POA: Diagnosis not present

## 2022-08-04 DIAGNOSIS — Z23 Encounter for immunization: Secondary | ICD-10-CM | POA: Diagnosis not present

## 2022-08-04 DIAGNOSIS — E1140 Type 2 diabetes mellitus with diabetic neuropathy, unspecified: Secondary | ICD-10-CM | POA: Diagnosis not present

## 2022-09-02 DIAGNOSIS — K219 Gastro-esophageal reflux disease without esophagitis: Secondary | ICD-10-CM | POA: Diagnosis not present

## 2022-09-02 DIAGNOSIS — K5010 Crohn's disease of large intestine without complications: Secondary | ICD-10-CM | POA: Diagnosis not present

## 2022-09-03 ENCOUNTER — Non-Acute Institutional Stay (HOSPITAL_COMMUNITY)
Admission: RE | Admit: 2022-09-03 | Discharge: 2022-09-03 | Disposition: A | Discharge status: Home / Self Care | Payer: Medicare Other | Source: Ambulatory Visit | Attending: Internal Medicine | Admitting: Internal Medicine

## 2022-09-03 DIAGNOSIS — K5090 Crohn's disease, unspecified, without complications: Secondary | ICD-10-CM | POA: Diagnosis not present

## 2022-09-03 MED ORDER — SODIUM CHLORIDE 0.9 % IV SOLN
400.0000 mg | INTRAVENOUS | Status: DC
  Administered 2022-09-03: 400 mg via INTRAVENOUS
  Filled 2022-09-03: qty 40

## 2022-09-03 MED ORDER — METHYLPREDNISOLONE SODIUM SUCC 40 MG IJ SOLR
20.0000 mg | Freq: Once | INTRAMUSCULAR | Status: DC
  Filled 2022-09-03: qty 0.5

## 2022-09-03 MED ORDER — SODIUM CHLORIDE 0.9 % IV SOLN: INTRAVENOUS | Status: DC | PRN

## 2022-09-03 MED ORDER — ACETAMINOPHEN 325 MG PO TABS
650.0000 mg | ORAL_TABLET | Freq: Once | ORAL | Status: AC
  Administered 2022-09-03: 650 mg via ORAL
  Filled 2022-09-03: qty 2

## 2022-09-03 MED ORDER — METHYLPREDNISOLONE SODIUM SUCC 125 MG IJ SOLR
20.0000 mg | Freq: Once | INTRAMUSCULAR | Status: AC
Start: 2022-09-03 — End: 2022-09-03
  Administered 2022-09-03: 20 mg via INTRAVENOUS
  Filled 2022-09-03: qty 2

## 2022-09-03 NOTE — Progress Notes (Signed)
PATIENT CARE CENTER NOTE:    Diagnosis: Crohn's disease     Provider: Wilford Corner MD     Procedure: Remicade 432m infusion     Note: Patient received Remicade infusion via PIV. Patient premedicated with 218mSolumedrol IV and 65031mylenol PO. Infusion titrated per protocol. Patient tolerated well with no adverse reaction. Vital signs stable. Pt declined to wait for observation after infusion. Pt declined AVS. Patient to come back every 8 weeks for infusion and will schedule next appointment at the front desk.  Patient alert, oriented and ambulatory at discharge.

## 2022-09-13 ENCOUNTER — Encounter (HOSPITAL_BASED_OUTPATIENT_CLINIC_OR_DEPARTMENT_OTHER): Payer: Self-pay | Admitting: Emergency Medicine

## 2022-09-13 ENCOUNTER — Other Ambulatory Visit: Payer: Self-pay

## 2022-09-13 ENCOUNTER — Emergency Department (HOSPITAL_BASED_OUTPATIENT_CLINIC_OR_DEPARTMENT_OTHER)
Admission: EM | Admit: 2022-09-13 | Discharge: 2022-09-13 | Disposition: A | Discharge status: Home / Self Care | Payer: Medicare Other | Attending: Emergency Medicine | Admitting: Emergency Medicine

## 2022-09-13 ENCOUNTER — Emergency Department (HOSPITAL_BASED_OUTPATIENT_CLINIC_OR_DEPARTMENT_OTHER): Payer: Medicare Other

## 2022-09-13 DIAGNOSIS — Z743 Need for continuous supervision: Secondary | ICD-10-CM | POA: Diagnosis not present

## 2022-09-13 DIAGNOSIS — Z7984 Long term (current) use of oral hypoglycemic drugs: Secondary | ICD-10-CM | POA: Diagnosis not present

## 2022-09-13 DIAGNOSIS — Z79899 Other long term (current) drug therapy: Secondary | ICD-10-CM | POA: Diagnosis not present

## 2022-09-13 DIAGNOSIS — R42 Dizziness and giddiness: Secondary | ICD-10-CM

## 2022-09-13 DIAGNOSIS — E119 Type 2 diabetes mellitus without complications: Secondary | ICD-10-CM | POA: Insufficient documentation

## 2022-09-13 DIAGNOSIS — R112 Nausea with vomiting, unspecified: Secondary | ICD-10-CM | POA: Diagnosis not present

## 2022-09-13 DIAGNOSIS — I10 Essential (primary) hypertension: Secondary | ICD-10-CM | POA: Insufficient documentation

## 2022-09-13 DIAGNOSIS — Z7982 Long term (current) use of aspirin: Secondary | ICD-10-CM | POA: Insufficient documentation

## 2022-09-13 DIAGNOSIS — R6889 Other general symptoms and signs: Secondary | ICD-10-CM | POA: Diagnosis not present

## 2022-09-13 DIAGNOSIS — R404 Transient alteration of awareness: Secondary | ICD-10-CM | POA: Diagnosis not present

## 2022-09-13 DIAGNOSIS — R110 Nausea: Secondary | ICD-10-CM | POA: Diagnosis not present

## 2022-09-13 LAB — CBC WITH DIFFERENTIAL/PLATELET
Abs Immature Granulocytes: 0.03 10*3/uL (ref 0.00–0.07)
Basophils Absolute: 0 10*3/uL (ref 0.0–0.1)
Basophils Relative: 0 %
Eosinophils Absolute: 0.3 10*3/uL (ref 0.0–0.5)
Eosinophils Relative: 5 %
HCT: 41 % (ref 36.0–46.0)
Hemoglobin: 13.9 g/dL (ref 12.0–15.0)
Immature Granulocytes: 0 %
Lymphocytes Relative: 31 %
Lymphs Abs: 2.1 10*3/uL (ref 0.7–4.0)
MCH: 34.4 pg — ABNORMAL HIGH (ref 26.0–34.0)
MCHC: 33.9 g/dL (ref 30.0–36.0)
MCV: 101.5 fL — ABNORMAL HIGH (ref 80.0–100.0)
Monocytes Absolute: 0.5 10*3/uL (ref 0.1–1.0)
Monocytes Relative: 8 %
Neutro Abs: 3.8 10*3/uL (ref 1.7–7.7)
Neutrophils Relative %: 56 %
Platelets: 235 10*3/uL (ref 150–400)
RBC: 4.04 MIL/uL (ref 3.87–5.11)
RDW: 14 % (ref 11.5–15.5)
WBC: 6.8 10*3/uL (ref 4.0–10.5)
nRBC: 0 % (ref 0.0–0.2)

## 2022-09-13 LAB — COMPREHENSIVE METABOLIC PANEL
ALT: 21 U/L (ref 0–44)
AST: 19 U/L (ref 15–41)
Albumin: 4 g/dL (ref 3.5–5.0)
Alkaline Phosphatase: 56 U/L (ref 38–126)
Anion gap: 13 (ref 5–15)
BUN: 12 mg/dL (ref 8–23)
CO2: 25 mmol/L (ref 22–32)
Calcium: 9.7 mg/dL (ref 8.9–10.3)
Chloride: 101 mmol/L (ref 98–111)
Creatinine, Ser: 0.73 mg/dL (ref 0.44–1.00)
GFR, Estimated: >60 mL/min (ref >60)
Glucose, Bld: 150 mg/dL — ABNORMAL HIGH (ref 70–99)
Potassium: 3.7 mmol/L (ref 3.5–5.1)
Sodium: 139 mmol/L (ref 135–145)
Total Bilirubin: 0.7 mg/dL (ref 0.3–1.2)
Total Protein: 7.2 g/dL (ref 6.5–8.1)

## 2022-09-13 MED ORDER — ONDANSETRON HCL 4 MG PO TABS: 4.0000 mg | ORAL_TABLET | Freq: Four times a day (QID) | ORAL | 0 refills | Status: DC

## 2022-09-13 MED ORDER — MECLIZINE HCL 25 MG PO TABS: 25.0000 mg | ORAL_TABLET | Freq: Three times a day (TID) | ORAL | 0 refills | Status: DC | PRN

## 2022-09-13 NOTE — ED Triage Notes (Signed)
Pt arrives to ED with c/o dizziness, lightheadedness, and nausea that started when she woke up at 930am. She does report going to bed at 130pm and feeling normal.

## 2022-09-13 NOTE — Discharge Instructions (Signed)
You were seen in the emergency department for your dizziness.  Your EKG, labs and head CT were normal and you had no signs of stroke on your exam.  Your dizziness is likely due to vertigo which is most commonly caused from an inner ear issues where the crystals that help with your balance become loose.  If your symptoms return you can take meclizine as needed for the dizziness and Zofran as needed for the nausea.  You should follow-up with your primary doctor in the next few days to have your symptoms rechecked.  You should return to the emergency department if you have significantly worsening dizziness that does not go away, you have numbness or weakness on one side of the body compared to the other, you pass out or if you have any other new or concerning symptoms.

## 2022-09-13 NOTE — ED Provider Notes (Signed)
[facility] EMERGENCY DEPT Provider Note   CSN: [id number] Arrival date & time: 09/13/22  1116     History  Chief Complaint  Patient presents with   Dizziness    [name] is a 71 y.o. female.  Patient is a 71 year old female with a past medical history of hypertension, diabetes, Crohn's disease presenting to the emergency department with dizziness.  The patient states that she woke up with this morning and when she sat up in bed started to develop room spinning dizziness with lightheadedness, nausea and vomiting.  She states that her symptoms lasted for approximately 10 to 15 minutes and resolved on their own.  She denied any associated headache, numbness or weakness or vision changes.  She denied any associated chest pain or shortness of breath.  She denies any recent fevers or cough, runny nose or sore throat, ear pain or hearing changes.  She states that this happened to her once before several years ago and was seen in the ER but it has not recurred since.  The history is provided by the patient.  Dizziness      Home Medications Prior to Admission medications   Medication Sig Start Date End Date Taking? Authorizing Provider  meclizine (ANTIVERT) 25 MG tablet Take 1 tablet (25 mg total) by mouth 3 (three) times daily as needed for dizziness. 09/13/22  Yes Maylon Peppers, [place] K, DO  ondansetron (ZOFRAN) 4 MG tablet Take 1 tablet (4 mg total) by mouth every 6 (six) hours. 09/13/22  Yes Leanord Asal K, DO  acetaminophen (TYLENOL) 650 MG CR tablet Take 1,300 mg by mouth every 8 (eight) hours as needed for pain.    [provider]  aspirin EC 81 MG tablet Take 81 mg by mouth daily.    [provider]  Cholecalciferol 1000 units TBDP Take 2,000 Units by mouth daily.     [provider]  COVID-19 mRNA bivalent vaccine, Pfizer, (PFIZER COVID-19 VAC BIVALENT) injection Inject into the muscle. 10/27/21   Carlyle Basques, MD   Dulaglutide (TRULICITY) 1.03 PR/9.4VO SOPN Inject 0.75 mg into the skin once a week.    [provider]  empagliflozin (JARDIANCE) 25 MG TABS tablet Take 25 mg by mouth daily. 12/07/19   [provider]  fluconazole (DIFLUCAN) 100 MG tablet Take 100 mg by mouth every 7 (seven) days.    [provider]  fluticasone (FLONASE) 50 MCG/ACT nasal spray Place 2 sprays into both nostrils daily. 02/20/15   Elsie Stain, MD  gabapentin (NEURONTIN) 300 MG capsule Take 300 mg by mouth 2 (two) times daily.    [provider]  glimepiride (AMARYL) 1 MG tablet Take 2 mg by mouth daily with breakfast.     [provider]  InFLIXimab (REMICADE IV) Inject into the vein every 8 (eight) weeks. For Crohn's    [provider]  losartan-hydrochlorothiazide (HYZAAR) 100-12.5 MG tablet Take 1 tablet by mouth daily.    [provider]  montelukast (SINGULAIR) 10 MG tablet TAKE 1 TABLET BY MOUTH AT  BEDTIME 01/16/19   Mannam, Praveen, MD  Multiple Vitamin (MULTIVITAMIN WITH MINERALS) TABS Take 1 tablet by mouth daily.    [provider]  omeprazole (PRILOSEC) 20 MG capsule TAKE 1 CAPSULE BY MOUTH  DAILY 01/16/19   Mannam, Praveen, MD  OVER THE COUNTER MEDICATION Take 2 tablets by mouth daily as needed. Sinus Medication.    [provider]  sertraline (ZOLOFT) 100 MG tablet Take 100 mg by  mouth daily.      [provider]  simvastatin (ZOCOR) 40 MG tablet Take 40 mg by mouth daily.    [provider]      Allergies    Metformin hcl er    Review of Systems   Review of Systems  Neurological:  Positive for dizziness.    Physical Exam Updated Vital Signs BP 132/61 (BP Location: Right Arm)   Pulse 63   Temp 98 F (36.7 C) (Oral)   Resp 16   Ht 5' 4"  (1.626 m)   Wt 89.8 kg   SpO2 97%   BMI 33.99 kg/m  Physical Exam Vitals and nursing note reviewed.  Constitutional:      General: She is not in acute distress.     Appearance: Normal appearance.  HENT:     Head: Normocephalic and atraumatic.     Right Ear: Tympanic membrane, ear canal and external ear normal.     Left Ear: Tympanic membrane, ear canal and external ear normal.     Nose: Nose normal.     Mouth/Throat:     Mouth: Mucous membranes are moist.     Pharynx: Oropharynx is clear.  Eyes:     Extraocular Movements: Extraocular movements intact.     Conjunctiva/sclera: Conjunctivae normal.     Pupils: Pupils are equal, round, and reactive to light.  Cardiovascular:     Rate and Rhythm: Normal rate and regular rhythm.     Pulses: Normal pulses.     Heart sounds: Normal heart sounds.  Pulmonary:     Effort: Pulmonary effort is normal.     Breath sounds: Normal breath sounds.  Abdominal:     General: Abdomen is flat.     Palpations: Abdomen is soft.     Tenderness: There is no abdominal tenderness.  Musculoskeletal:        General: Normal range of motion.     Cervical back: Normal range of motion and neck supple.     Right lower leg: No edema.     Left lower leg: No edema.  Skin:    General: Skin is warm and dry.  Neurological:     General: No focal deficit present.     Mental Status: She is alert and oriented to person, place, and time.     Cranial Nerves: No cranial nerve deficit.     Sensory: No sensory deficit.     Motor: No weakness.     Coordination: Coordination normal.  Psychiatric:        Mood and Affect: Mood normal.        Behavior: Behavior normal.     ED Results / Procedures / Treatments   Labs (all labs ordered are listed, but only abnormal results are displayed) Labs Reviewed  COMPREHENSIVE METABOLIC PANEL - Abnormal; Notable for the following components:      Result Value   Glucose, Bld 150 (*)    All other components within normal limits  CBC WITH DIFFERENTIAL/PLATELET - Abnormal; Notable for the following components:   MCV 101.5 (*)    MCH 34.4 (*)    All other components within normal limits     EKG EKG Interpretation  Date/Time:  Sunday September 13 2022 11:45:25 EDT Ventricular Rate:  72 PR Interval:  196 QRS Duration: 86 QT Interval:  386 QTC Calculation: 422 R Axis:   -29 Text Interpretation: Normal sinus rhythm Anterior infarct , age undetermined Abnormal ECG When compared with ECG of 12-Aug-2021 14:47,  No significant change was found Confirmed by Leanord Asal ([id number]) on 09/13/2022 3:30:43 PM  Radiology CT Head Wo Contrast  Result Date: 09/13/2022 CLINICAL DATA:  Dizziness. EXAM: CT HEAD WITHOUT CONTRAST TECHNIQUE: Contiguous axial images were obtained from the base of the skull through the vertex without intravenous contrast. RADIATION DOSE REDUCTION: This exam was performed according to the departmental dose-optimization program which includes automated exposure control, adjustment of the mA and/or kV according to patient size and/or use of iterative reconstruction technique. COMPARISON:  February 27, 2005. FINDINGS: Brain: Mild chronic ischemic white matter disease is noted. No mass effect or midline shift is noted. Ventricular size is within normal limits. There is no evidence of mass lesion, hemorrhage or acute infarction. Vascular: No hyperdense vessel or unexpected calcification. Skull: Normal. Negative for fracture or focal lesion. Sinuses/Orbits: No acute finding. Other: None. IMPRESSION: No acute intracranial abnormality seen. Electronically Signed   By: Marijo Conception M.D.   On: 09/13/2022 12:40    Procedures Procedures    Medications Ordered in ED Medications - No data to display  ED Course/ Medical Decision Making/ A&P                           Medical Decision Making This patient presents to the ED with chief complaint(s) of dizziness with pertinent past medical history of hypertension, diabetes, Crohn's which further complicates the presenting complaint. The complaint involves an extensive differential diagnosis and also carries with it a high risk of  complications and morbidity.    The differential diagnosis includes ACS, arrhythmia, anemia, electrolyte abnormality, dehydration, central versus peripheral vertigo  Additional history obtained: Additional history obtained from N/A Records reviewed Care Everywhere/External Records  ED Course and Reassessment: Patient was initially evaluated by triage and had EKG, labs and CT head performed that showed no acute abnormality, labs within normal range and EKG unchanged from prior.  The patient has been asymptomatic since she has been in the emergency department.  Symptoms sound consistent with vertigo and she has no evidence of any neurologic deficits on exam with a normal head CT making central vertigo unlikely.  She is stable for discharge with primary care follow-up.  She will be given prescription of meclizine and Zofran as needed and was given strict return precautions.  Independent labs interpretation:  The following labs were independently interpreted: Within normal range  Independent visualization of imaging: - I independently visualized the following imaging with scope of interpretation limited to determining acute life threatening conditions related to emergency care: CT head, which revealed no acute disease  Consultation: - Consulted or discussed management/test interpretation w/ external professional: N/A  Consideration for admission or further workup: Patient has no emergent conditions requiring admission at this time and is stable for discharge with primary care follow-up Social Determinants of health: N/A    Amount and/or Complexity of Data Reviewed Labs: ordered. Radiology: ordered.          Final Clinical Impression(s) / ED Diagnoses Final diagnoses:  Vertigo    Rx / DC Orders ED Discharge Orders          Ordered    meclizine (ANTIVERT) 25 MG tablet  3 times daily PRN        09/13/22 1602    ondansetron (ZOFRAN) 4 MG tablet  Every 6 hours        09/13/22  1602              Maylon Peppers,  Norman Herrlich, DO 09/13/22 1603

## 2022-09-17 ENCOUNTER — Telehealth: Payer: Self-pay

## 2022-10-06 DIAGNOSIS — K5140 Inflammatory polyps of colon without complications: Secondary | ICD-10-CM | POA: Diagnosis not present

## 2022-10-06 DIAGNOSIS — K6389 Other specified diseases of intestine: Secondary | ICD-10-CM | POA: Diagnosis not present

## 2022-10-06 DIAGNOSIS — K648 Other hemorrhoids: Secondary | ICD-10-CM | POA: Diagnosis not present

## 2022-10-06 DIAGNOSIS — K5010 Crohn's disease of large intestine without complications: Secondary | ICD-10-CM | POA: Diagnosis not present

## 2022-10-08 DIAGNOSIS — K5140 Inflammatory polyps of colon without complications: Secondary | ICD-10-CM | POA: Diagnosis not present

## 2022-10-27 ENCOUNTER — Inpatient Hospital Stay: Payer: Medicare Other | Attending: Oncology | Admitting: Oncology

## 2022-10-27 ENCOUNTER — Inpatient Hospital Stay: Payer: Medicare Other

## 2022-10-27 ENCOUNTER — Other Ambulatory Visit (HOSPITAL_BASED_OUTPATIENT_CLINIC_OR_DEPARTMENT_OTHER): Payer: Self-pay

## 2022-10-27 VITALS — BP 147/67 | HR 79 | Temp 98.2°F | Resp 16 | Wt 198.2 lb

## 2022-10-27 DIAGNOSIS — K5090 Crohn's disease, unspecified, without complications: Secondary | ICD-10-CM | POA: Insufficient documentation

## 2022-10-27 DIAGNOSIS — G8912 Acute post-thoracotomy pain: Secondary | ICD-10-CM | POA: Insufficient documentation

## 2022-10-27 DIAGNOSIS — C8519 Unspecified B-cell lymphoma, extranodal and solid organ sites: Secondary | ICD-10-CM | POA: Diagnosis not present

## 2022-10-27 DIAGNOSIS — C884 Extranodal marginal zone B-cell lymphoma of mucosa-associated lymphoid tissue [MALT-lymphoma]: Secondary | ICD-10-CM | POA: Insufficient documentation

## 2022-10-27 DIAGNOSIS — K922 Gastrointestinal hemorrhage, unspecified: Secondary | ICD-10-CM | POA: Insufficient documentation

## 2022-10-27 DIAGNOSIS — D638 Anemia in other chronic diseases classified elsewhere: Secondary | ICD-10-CM | POA: Diagnosis not present

## 2022-10-27 LAB — CBC WITH DIFFERENTIAL (CANCER CENTER ONLY)
Abs Immature Granulocytes: 0.02 10*3/uL (ref 0.00–0.07)
Basophils Absolute: 0 10*3/uL (ref 0.0–0.1)
Basophils Relative: 1 %
Eosinophils Absolute: 0.5 10*3/uL (ref 0.0–0.5)
Eosinophils Relative: 6 %
HCT: 43.2 % (ref 36.0–46.0)
Hemoglobin: 14.2 g/dL (ref 12.0–15.0)
Immature Granulocytes: 0 %
Lymphocytes Relative: 39 %
Lymphs Abs: 3 10*3/uL (ref 0.7–4.0)
MCH: 33.8 pg (ref 26.0–34.0)
MCHC: 32.9 g/dL (ref 30.0–36.0)
MCV: 102.9 fL — ABNORMAL HIGH (ref 80.0–100.0)
Monocytes Absolute: 0.6 10*3/uL (ref 0.1–1.0)
Monocytes Relative: 7 %
Neutro Abs: 3.6 10*3/uL (ref 1.7–7.7)
Neutrophils Relative %: 47 %
Platelet Count: 265 10*3/uL (ref 150–400)
RBC: 4.2 MIL/uL (ref 3.87–5.11)
RDW: 13.9 % (ref 11.5–15.5)
WBC Count: 7.6 10*3/uL (ref 4.0–10.5)
nRBC: 0 % (ref 0.0–0.2)

## 2022-10-27 LAB — CMP (CANCER CENTER ONLY)
ALT: 23 U/L (ref 0–44)
AST: 18 U/L (ref 15–41)
Albumin: 4.3 g/dL (ref 3.5–5.0)
Alkaline Phosphatase: 58 U/L (ref 38–126)
Anion gap: 9 (ref 5–15)
BUN: 11 mg/dL (ref 8–23)
CO2: 31 mmol/L (ref 22–32)
Calcium: 9.9 mg/dL (ref 8.9–10.3)
Chloride: 101 mmol/L (ref 98–111)
Creatinine: 0.72 mg/dL (ref 0.44–1.00)
GFR, Estimated: >60 mL/min (ref >60)
Glucose, Bld: 171 mg/dL — ABNORMAL HIGH (ref 70–99)
Potassium: 3.6 mmol/L (ref 3.5–5.1)
Sodium: 141 mmol/L (ref 135–145)
Total Bilirubin: 0.8 mg/dL (ref 0.3–1.2)
Total Protein: 8.4 g/dL — ABNORMAL HIGH (ref 6.5–8.1)

## 2022-10-27 MED ORDER — PREVNAR 20 0.5 ML IM SUSY
PREFILLED_SYRINGE | INTRAMUSCULAR | 0 refills | Status: DC
  Filled 2022-10-27: qty 0.5, 1d supply, fill #0

## 2022-10-27 NOTE — Progress Notes (Signed)
  [facility] OFFICE PROGRESS NOTE   Diagnosis: MALT lymphoma of the lung  INTERVAL HISTORY:   [name] returns as scheduled.  She reports improvement in the right postthoracotomy pain after the gabapentin dose was increased.  No recent infection.  No rectal bleeding.  She continues follow-up with [name] for management of inflammatory bowel disease.  She reports a recent colonoscopy. She has an intermittent cough she relates to a sinus drainage.  Objective:  Vital signs in last 24 hours:  Blood pressure (!) 147/67, pulse 79, temperature 98.2 F (36.8 C), temperature source Temporal, weight 198 lb 3.2 oz (89.9 kg), SpO2 96 %.    Lymphatics: No cervical, supraclavicular, axillary, or inguinal nodes Resp: Bilateral inspiratory rhonchi and expiratory wheeze, no respiratory distress Cardio: Regular rate and rhythm GI: No hepatosplenomegaly Vascular: No leg edema  Skin: Keloid at the right thoracotomy scar without evidence of recurrent tumor a  Lab Results:  Lab Results  Component Value Date   WBC 7.6 10/27/2022   HGB 14.2 10/27/2022   HCT 43.2 10/27/2022   MCV 102.9 (H) 10/27/2022   PLT 265 10/27/2022   NEUTROABS 3.6 10/27/2022    CMP  Lab Results  Component Value Date   NA 141 10/27/2022   K 3.6 10/27/2022   CL 101 10/27/2022   CO2 31 10/27/2022   GLUCOSE 171 (H) 10/27/2022   BUN 11 10/27/2022   CREATININE 0.72 10/27/2022   CALCIUM 9.9 10/27/2022   PROT 8.4 (H) 10/27/2022   ALBUMIN 4.3 10/27/2022   AST 18 10/27/2022   ALT 23 10/27/2022   ALKPHOS 58 10/27/2022   BILITOT 0.8 10/27/2022   GFRNONAA >60 10/27/2022   GFRAA >60 10/30/2019     Medications: I have reviewed the patient's current medications.   Assessment/Plan: Non-Hodgkin lymphoma, low grade lymphoma involving the lungs, diagnosed in June 2006. No clinical evidence of progression. Crohn's disease, followed by [name].  She continues Remicade Post thoracotomy pain,  persistent, initially improved with Elavil, now taking gabapentin Anemia secondary to chronic disease and gastrointestinal bleeding. Hemoglobin is improved. History of thrombocytosis. History of anorexia/weight loss in the setting of active Crohn disease. Improved. History of a serum monoclonal protein, stable on repeat serum protein electrophoresis studies, last in  December 2022 History of asthma. . Area of distortion/calcifications in the lower inner left breast July 2022-seed localized lumpectomy 08/14/2021-complex sclerosing lesion with focal atypical ductal hyperplasia, no malignancy       Disposition: [name] remains in clinical remission from lymphoma.  She has a history of a serum monoclonal protein.  We will follow-up on the serum protein electrophoresis from today.  [name] would like to continue follow-up at the Cancer center.  She will return for an office visit in 1 year.  Ashley Coder, MD  10/27/2022  12:38 PM

## 2022-10-29 ENCOUNTER — Non-Acute Institutional Stay (HOSPITAL_COMMUNITY)
Admission: RE | Admit: 2022-10-29 | Discharge: 2022-10-29 | Disposition: A | Discharge status: Home / Self Care | Payer: Medicare Other | Source: Ambulatory Visit | Attending: Internal Medicine | Admitting: Internal Medicine

## 2022-10-29 DIAGNOSIS — K5090 Crohn's disease, unspecified, without complications: Secondary | ICD-10-CM | POA: Insufficient documentation

## 2022-10-29 LAB — PROTEIN ELECTROPHORESIS, SERUM
A/G Ratio: 0.9 (ref 0.7–1.7)
Albumin ELP: 3.7 g/dL (ref 2.9–4.4)
Alpha-1-Globulin: 0.2 g/dL (ref 0.0–0.4)
Alpha-2-Globulin: 0.8 g/dL (ref 0.4–1.0)
Beta Globulin: 1.3 g/dL (ref 0.7–1.3)
Gamma Globulin: 1.8 g/dL (ref 0.4–1.8)
Globulin, Total: 4.1 g/dL — ABNORMAL HIGH (ref 2.2–3.9)
M-Spike, %: 0.8 g/dL — ABNORMAL HIGH
Total Protein ELP: 7.8 g/dL (ref 6.0–8.5)

## 2022-10-29 LAB — IGG: IgG (Immunoglobin G), Serum: 1791 mg/dL — ABNORMAL HIGH (ref 586–1602)

## 2022-10-29 MED ORDER — ACETAMINOPHEN 325 MG PO TABS
650.0000 mg | ORAL_TABLET | Freq: Once | ORAL | Status: AC
  Administered 2022-10-29: 650 mg via ORAL
  Filled 2022-10-29: qty 2

## 2022-10-29 MED ORDER — SODIUM CHLORIDE 0.9 % IV SOLN
400.0000 mg | Freq: Once | INTRAVENOUS | Status: AC
  Administered 2022-10-29: 400 mg via INTRAVENOUS
  Filled 2022-10-29: qty 40

## 2022-10-29 MED ORDER — METHYLPREDNISOLONE SODIUM SUCC 40 MG IJ SOLR
20.0000 mg | Freq: Once | INTRAMUSCULAR | Status: AC
  Administered 2022-10-29: 20 mg via INTRAVENOUS
  Filled 2022-10-29: qty 0.5

## 2022-10-29 MED ORDER — SODIUM CHLORIDE 0.9 % IV SOLN: INTRAVENOUS | Status: DC | PRN

## 2022-10-29 NOTE — Progress Notes (Signed)
PATIENT CARE CENTER NOTE:     Diagnosis: Crohn's disease     Provider: Wilford Corner MD     Procedure: Remicade 449m infusion     Note: Patient received Remicade infusion via PIV. Patient premedicated with 215mSolumedrol IV and 65056mylenol PO. Infusion titrated per protocol. Patient tolerated well with no adverse reaction. Vital signs stable. Pt declined to wait for observation after infusion. Pt declined AVS. Patient to come back every 8 weeks for infusion and will schedule next appointment at the front desk.  Patient alert, oriented and ambulatory at discharge.

## 2022-10-30 ENCOUNTER — Telehealth: Payer: Self-pay | Admitting: *Deleted

## 2022-10-30 NOTE — Telephone Encounter (Signed)
Patient notified of stable protein results and f/u as schededuled.

## 2022-10-30 NOTE — Telephone Encounter (Signed)
-----   Message from Ladell Pier, MD sent at 10/29/2022  8:30 PM EST ----- Please call patient the serum monoclonal protein level is stable, follow-up as scheduled

## 2022-12-24 ENCOUNTER — Non-Acute Institutional Stay (HOSPITAL_COMMUNITY)
Admission: RE | Admit: 2022-12-24 | Discharge: 2022-12-24 | Disposition: A | Discharge status: Home / Self Care | Payer: Medicare Other | Source: Ambulatory Visit | Attending: Internal Medicine | Admitting: Internal Medicine

## 2022-12-24 DIAGNOSIS — K5090 Crohn's disease, unspecified, without complications: Secondary | ICD-10-CM | POA: Diagnosis not present

## 2022-12-24 MED ORDER — ACETAMINOPHEN 325 MG PO TABS
650.0000 mg | ORAL_TABLET | Freq: Once | ORAL | Status: AC
  Administered 2022-12-24: 650 mg via ORAL
  Filled 2022-12-24: qty 2

## 2022-12-24 MED ORDER — METHYLPREDNISOLONE SODIUM SUCC 40 MG IJ SOLR
20.0000 mg | Freq: Once | INTRAMUSCULAR | Status: AC
  Administered 2022-12-24: 20 mg via INTRAVENOUS
  Filled 2022-12-24: qty 0.5

## 2022-12-24 MED ORDER — SODIUM CHLORIDE 0.9 % IV SOLN
400.0000 mg | Freq: Once | INTRAVENOUS | Status: AC
  Administered 2022-12-24: 400 mg via INTRAVENOUS
  Filled 2022-12-24: qty 40

## 2022-12-24 MED ORDER — SODIUM CHLORIDE 0.9 % IV SOLN: INTRAVENOUS | Status: DC | PRN

## 2022-12-24 NOTE — Progress Notes (Signed)
PATIENT CARE CENTER NOTE  Diagnosis: Crohn's disease    Provider: Wilford Corner MD  Procedure: Remicade 400 mg   Note: Patient received Remicade 400 mg via PIV. Pt pre medicated with 20 mg IV Solumedrol and 650 mg PO Tylenol. Infusion titrated per protocol. Patient tolerated infusion with no adverse reaction. Vital signs stable. Pt declined observation after infusion, and pt declined AVS. Patient advised to schedule next appointment at front desk. Alert, oriented and ambulatory at discharge.

## 2023-01-12 DIAGNOSIS — H2513 Age-related nuclear cataract, bilateral: Secondary | ICD-10-CM | POA: Diagnosis not present

## 2023-01-12 DIAGNOSIS — H25013 Cortical age-related cataract, bilateral: Secondary | ICD-10-CM | POA: Diagnosis not present

## 2023-01-12 DIAGNOSIS — H2511 Age-related nuclear cataract, right eye: Secondary | ICD-10-CM | POA: Diagnosis not present

## 2023-01-12 DIAGNOSIS — H25043 Posterior subcapsular polar age-related cataract, bilateral: Secondary | ICD-10-CM | POA: Diagnosis not present

## 2023-01-12 DIAGNOSIS — H18413 Arcus senilis, bilateral: Secondary | ICD-10-CM | POA: Diagnosis not present

## 2023-01-27 DIAGNOSIS — E1140 Type 2 diabetes mellitus with diabetic neuropathy, unspecified: Secondary | ICD-10-CM | POA: Diagnosis not present

## 2023-01-27 DIAGNOSIS — K219 Gastro-esophageal reflux disease without esophagitis: Secondary | ICD-10-CM | POA: Diagnosis not present

## 2023-01-27 DIAGNOSIS — K5010 Crohn's disease of large intestine without complications: Secondary | ICD-10-CM | POA: Diagnosis not present

## 2023-01-27 DIAGNOSIS — E1165 Type 2 diabetes mellitus with hyperglycemia: Secondary | ICD-10-CM | POA: Diagnosis not present

## 2023-01-27 DIAGNOSIS — I10 Essential (primary) hypertension: Secondary | ICD-10-CM | POA: Diagnosis not present

## 2023-01-27 DIAGNOSIS — G4733 Obstructive sleep apnea (adult) (pediatric): Secondary | ICD-10-CM | POA: Diagnosis not present

## 2023-01-27 DIAGNOSIS — E7800 Pure hypercholesterolemia, unspecified: Secondary | ICD-10-CM | POA: Diagnosis not present

## 2023-01-27 DIAGNOSIS — E559 Vitamin D deficiency, unspecified: Secondary | ICD-10-CM | POA: Diagnosis not present

## 2023-01-27 DIAGNOSIS — Z0000 Encounter for general adult medical examination without abnormal findings: Secondary | ICD-10-CM | POA: Diagnosis not present

## 2023-02-18 ENCOUNTER — Non-Acute Institutional Stay (HOSPITAL_COMMUNITY)
Admission: RE | Admit: 2023-02-18 | Discharge: 2023-02-18 | Disposition: A | Discharge status: Home / Self Care | Payer: Medicare Other | Source: Ambulatory Visit | Attending: Internal Medicine | Admitting: Internal Medicine

## 2023-02-18 DIAGNOSIS — K5090 Crohn's disease, unspecified, without complications: Secondary | ICD-10-CM | POA: Diagnosis not present

## 2023-02-18 MED ORDER — ACETAMINOPHEN 325 MG PO TABS
650.0000 mg | ORAL_TABLET | Freq: Once | ORAL | Status: AC
  Administered 2023-02-18: 650 mg via ORAL
  Filled 2023-02-18: qty 2

## 2023-02-18 MED ORDER — METHYLPREDNISOLONE SODIUM SUCC 40 MG IJ SOLR
20.0000 mg | Freq: Once | INTRAMUSCULAR | Status: AC
  Administered 2023-02-18: 20 mg via INTRAVENOUS
  Filled 2023-02-18: qty 0.5

## 2023-02-18 MED ORDER — SODIUM CHLORIDE 0.9 % IV SOLN
400.0000 mg | INTRAVENOUS | Status: DC
  Administered 2023-02-18: 400 mg via INTRAVENOUS
  Filled 2023-02-18: qty 40

## 2023-02-18 MED ORDER — SODIUM CHLORIDE 0.9 % IV SOLN: INTRAVENOUS | Status: DC | PRN

## 2023-02-18 NOTE — Progress Notes (Signed)
PATIENT CARE CENTER NOTE  Diagnosis:Crohn's disease    Provider: Wilford Corner MD  Procedure: Remicade 400 mg  Note: Patient received Remicade 400 mg via PIV. Pt pre medicated with 20 mg IV Solumedrol and 650 mg PO Tylenol. Infusion titrated per protocol. Pt tolerated infusion with no adverse reaction. AVS offered, but pt declined. Pt to receive infusion every 8 weeks, and advised to schedule next appointment at front desk.

## 2023-03-30 ENCOUNTER — Other Ambulatory Visit: Payer: Self-pay | Admitting: Obstetrics and Gynecology

## 2023-03-30 DIAGNOSIS — Z1231 Encounter for screening mammogram for malignant neoplasm of breast: Secondary | ICD-10-CM

## 2023-03-31 DIAGNOSIS — H25811 Combined forms of age-related cataract, right eye: Secondary | ICD-10-CM | POA: Diagnosis not present

## 2023-03-31 DIAGNOSIS — H2511 Age-related nuclear cataract, right eye: Secondary | ICD-10-CM | POA: Diagnosis not present

## 2023-04-01 DIAGNOSIS — H2512 Age-related nuclear cataract, left eye: Secondary | ICD-10-CM | POA: Diagnosis not present

## 2023-04-01 DIAGNOSIS — H25042 Posterior subcapsular polar age-related cataract, left eye: Secondary | ICD-10-CM | POA: Diagnosis not present

## 2023-04-01 DIAGNOSIS — H25012 Cortical age-related cataract, left eye: Secondary | ICD-10-CM | POA: Diagnosis not present

## 2023-04-09 ENCOUNTER — Ambulatory Visit
Admission: RE | Admit: 2023-04-09 | Discharge: 2023-04-09 | Disposition: A | Discharge status: Home / Self Care | Payer: Medicare Other | Source: Ambulatory Visit | Attending: Obstetrics and Gynecology | Admitting: Obstetrics and Gynecology

## 2023-04-09 DIAGNOSIS — Z1231 Encounter for screening mammogram for malignant neoplasm of breast: Secondary | ICD-10-CM | POA: Diagnosis not present

## 2023-04-16 IMAGING — US US BREAST*L* LIMITED INC AXILLA
1 series · 7 of 7 positions shown · non-contrast
Comparison: Previous exam(s).

CLINICAL DATA: Screening recall for left breast calcifications and
a possible distortion.

EXAM:
DIGITAL DIAGNOSTIC UNILATERAL LEFT MAMMOGRAM WITH TOMOSYNTHESIS AND
CAD; ULTRASOUND LEFT BREAST LIMITED
TECHNIQUE: Left digital diagnostic mammography and breast tomosynthesis was
performed. The images were evaluated with computer-aided detection.;
Targeted ultrasound examination of the left breast was performed

[Series 1: us breast*left* limited inc axilla · 0.08mm/px · 7 of 7 slices shown]
[im 1/7]
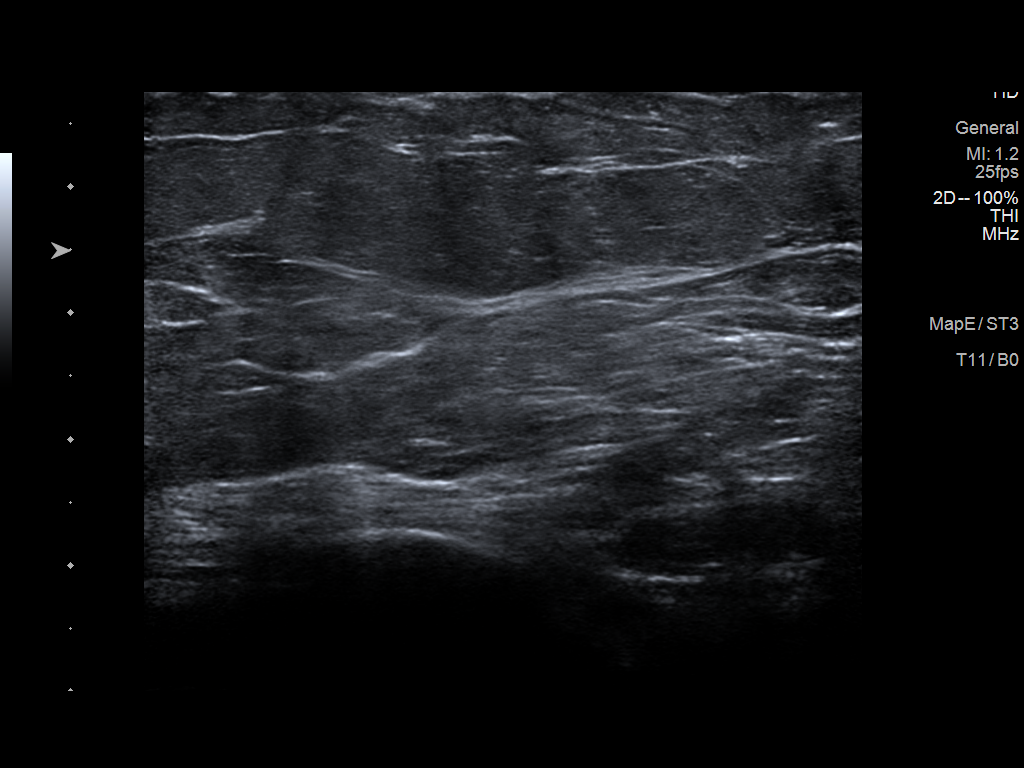
[im 2/7]
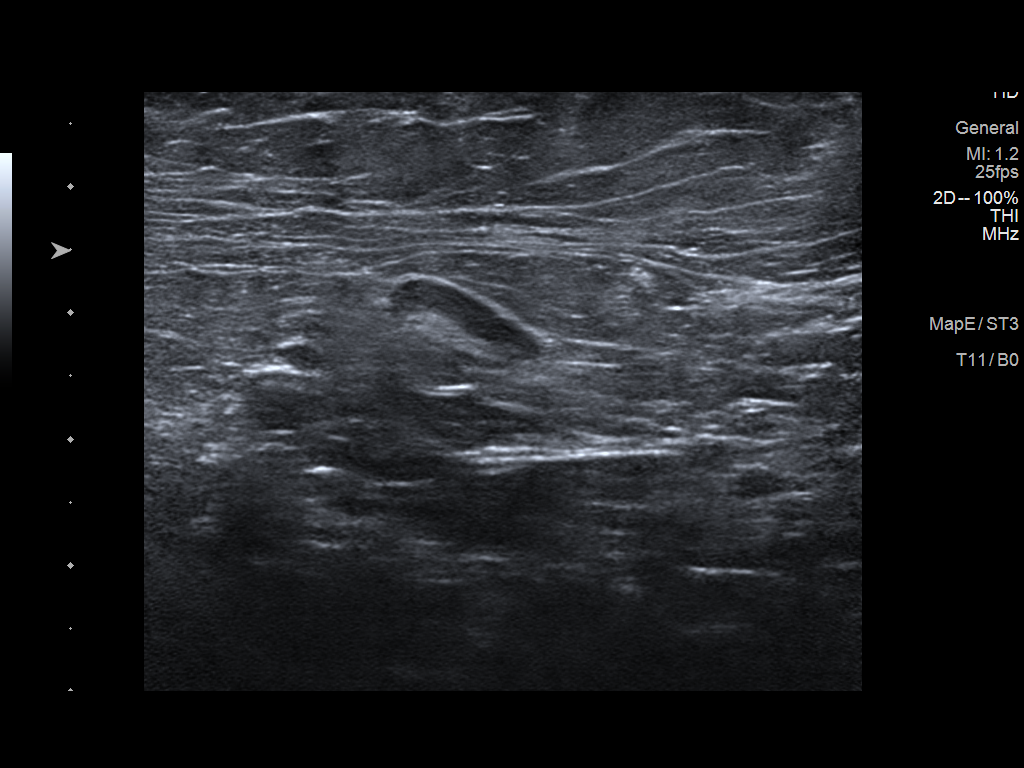
[im 3/7]
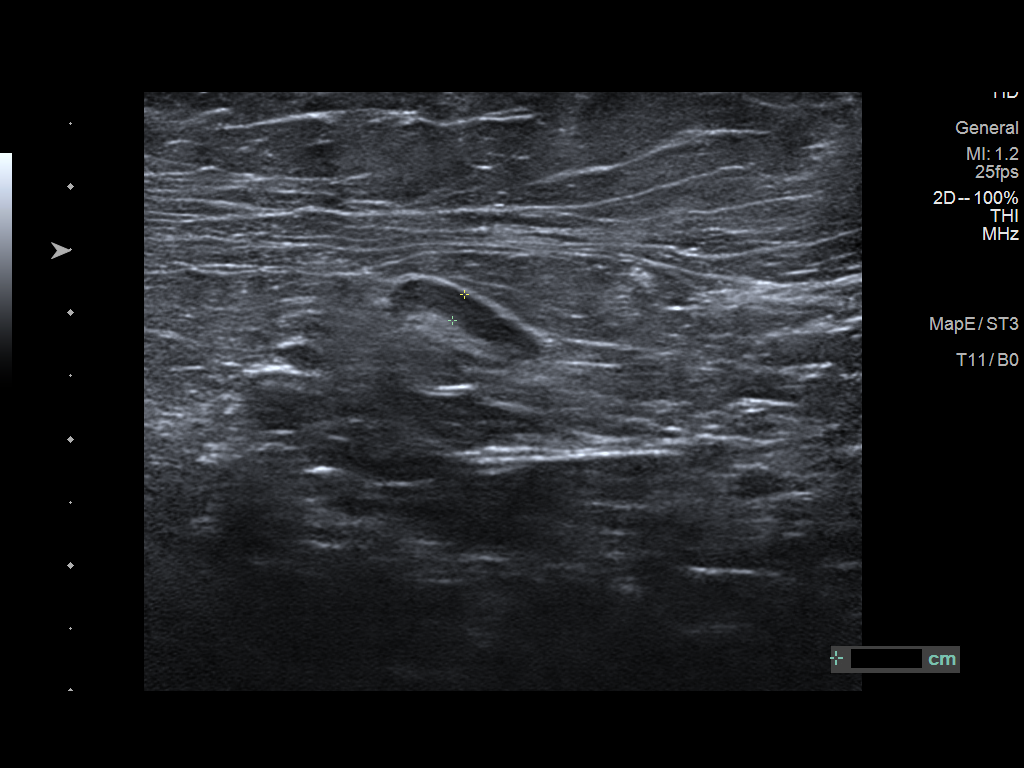
[im 4/7]
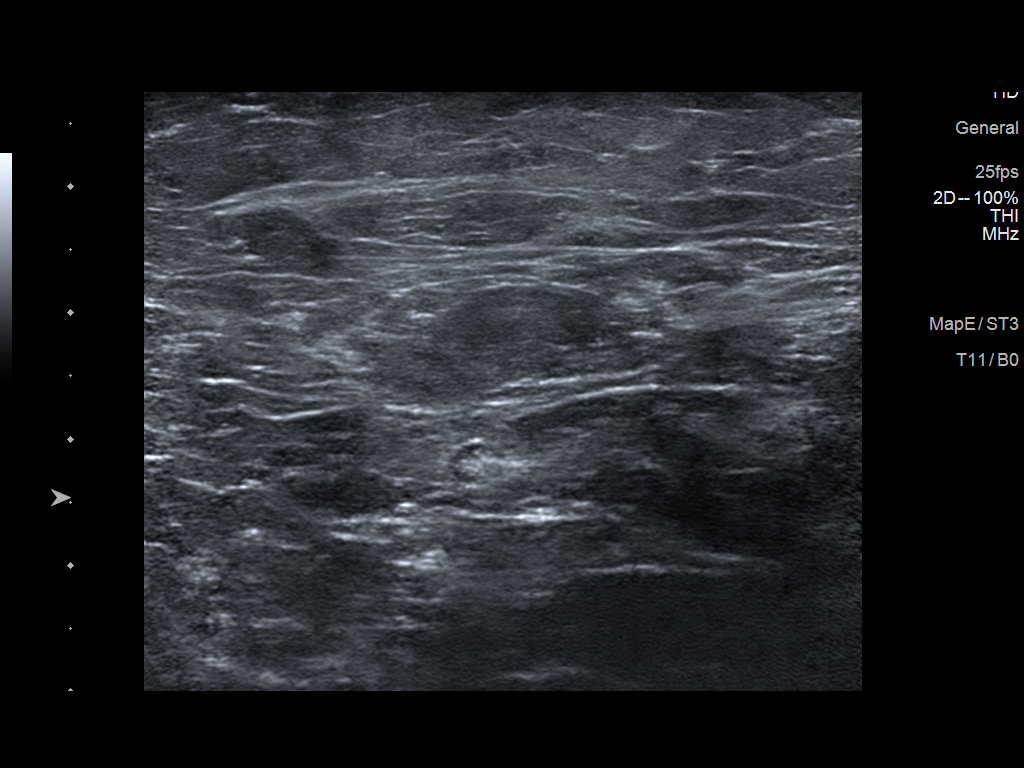
[im 5/7]
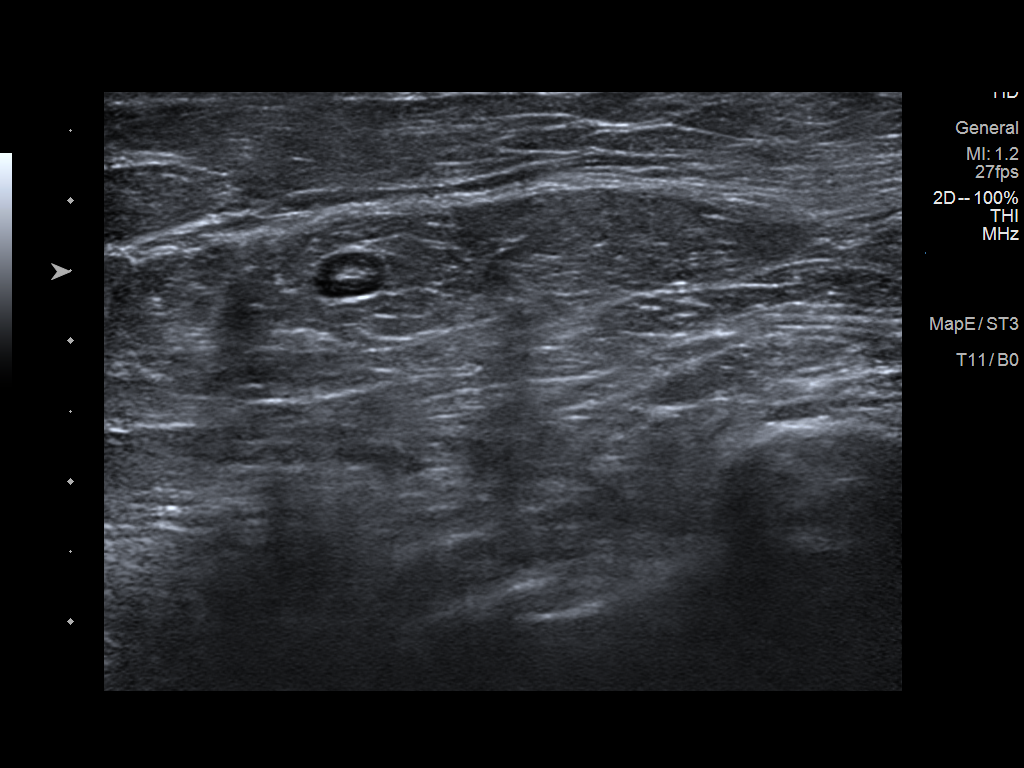
[im 6/7]
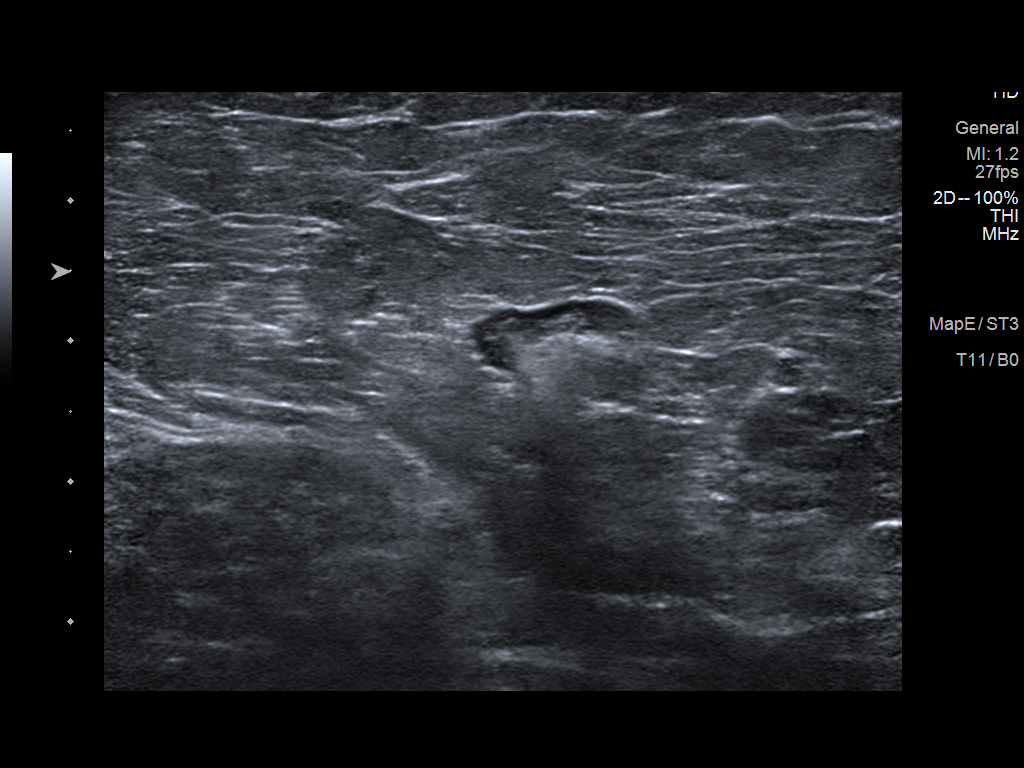
[im 7/7]
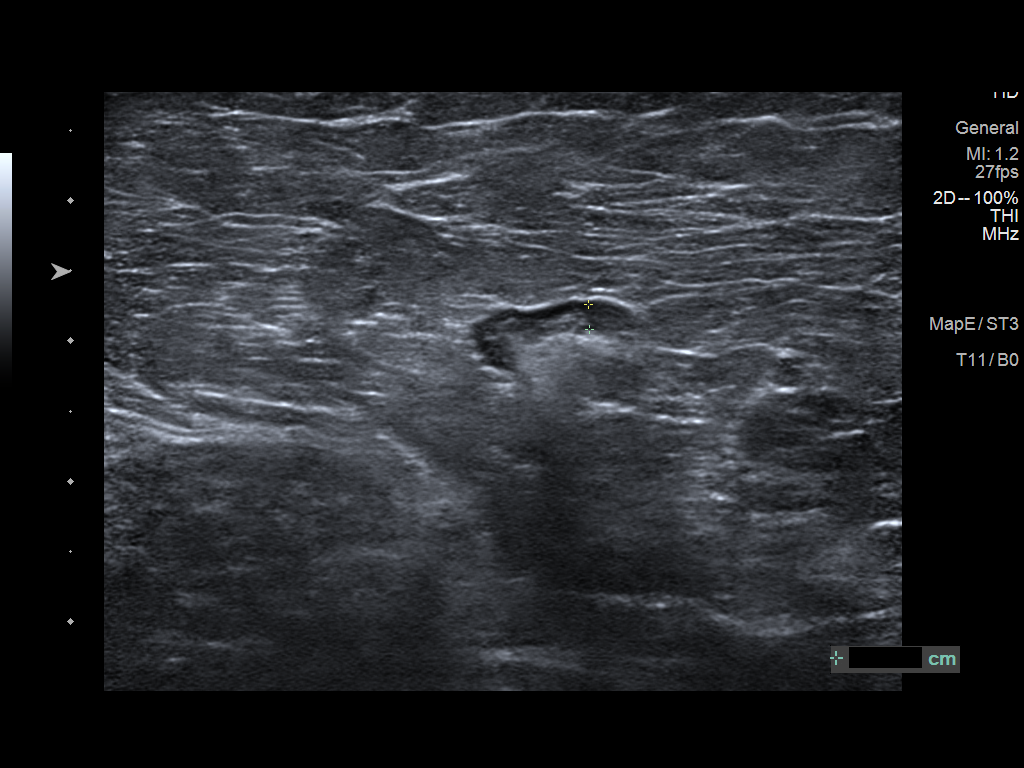

[7 of 7 positions shown; findings below may reference images not displayed]

ACR Breast Density Category b: There are scattered areas of
fibroglandular density.
FINDINGS: Spot compression tomosynthesis images through the lower slightly
inner quadrant of the left breast, posterior depth demonstrates a
persistent fine area of distortion. There are associated scattered
linear calcifications spanning 4.4 cm.

Ultrasound targeted to the lower slightly inner left breast
demonstrates normal fibroglandular tissue. No suspicious masses or
areas of shadowing are identified.

Ultrasound of the left axilla demonstrates multiple normal-appearing
lymph nodes.
IMPRESSION: 1. There is a distortion with loosely grouped calcifications in the
lower-inner left breast spanning 4.4 cm.

RECOMMENDATION:
1. Stereotactic biopsy is recommended for the left breast distortion
with calcifications. The procedure has been scheduled for 06/16/2021
at [DATE] a.m.

I have discussed the findings and recommendations with the patient.
If applicable, a reminder letter will be sent to the patient
regarding the next appointment.

BI-RADS CATEGORY  4: Suspicious.

## 2023-04-21 ENCOUNTER — Non-Acute Institutional Stay (HOSPITAL_COMMUNITY)
Admission: RE | Admit: 2023-04-21 | Discharge: 2023-04-21 | Disposition: A | Discharge status: Home / Self Care | Payer: Medicare Other | Source: Ambulatory Visit | Attending: Internal Medicine | Admitting: Internal Medicine

## 2023-04-21 DIAGNOSIS — K5090 Crohn's disease, unspecified, without complications: Secondary | ICD-10-CM | POA: Diagnosis not present

## 2023-04-21 MED ORDER — ACETAMINOPHEN 325 MG PO TABS
650.0000 mg | ORAL_TABLET | Freq: Once | ORAL | Status: AC
  Administered 2023-04-21: 650 mg via ORAL
  Filled 2023-04-21: qty 2

## 2023-04-21 MED ORDER — SODIUM CHLORIDE 0.9 % IV SOLN
400.0000 mg | INTRAVENOUS | Status: DC
  Administered 2023-04-21: 400 mg via INTRAVENOUS
  Filled 2023-04-21: qty 40

## 2023-04-21 MED ORDER — SODIUM CHLORIDE 0.9 % IV SOLN: INTRAVENOUS | Status: DC | PRN

## 2023-04-21 MED ORDER — METHYLPREDNISOLONE SODIUM SUCC 40 MG IJ SOLR
20.0000 mg | Freq: Once | INTRAMUSCULAR | Status: AC
  Administered 2023-04-21: 20 mg via INTRAVENOUS
  Filled 2023-04-21: qty 0.5

## 2023-04-21 NOTE — Progress Notes (Signed)
PATIENT CARE CENTER NOTE   Diagnosis: Crohn's Disease     Provider: Charlott Rakes MD     Procedure: Remicade 400mg  infusion     Note: Patient received Remicade infusion via PIV. Pt premedicated with Tylenol PO and Solumedrol IV per orders. Infusion titrated per protocol. Patient tolerated well with no adverse reaction. Vital signs stable. Pt declined AVS.  Patient to come back every 8 weeks for infusion, instructed pt to schedule next appointment at front desk prior to leaving, verbalized understanding. Alert, oriented and ambulatory at discharge.

## 2023-06-02 DIAGNOSIS — H2512 Age-related nuclear cataract, left eye: Secondary | ICD-10-CM | POA: Diagnosis not present

## 2023-06-16 ENCOUNTER — Ambulatory Visit: Payer: Self-pay | Admitting: Internal Medicine

## 2023-06-16 ENCOUNTER — Non-Acute Institutional Stay (HOSPITAL_COMMUNITY)
Admission: RE | Admit: 2023-06-16 | Discharge: 2023-06-16 | Disposition: A | Discharge status: Home / Self Care | Payer: Medicare Other | Source: Ambulatory Visit | Attending: Internal Medicine | Admitting: Internal Medicine

## 2023-06-16 DIAGNOSIS — K5090 Crohn's disease, unspecified, without complications: Secondary | ICD-10-CM | POA: Insufficient documentation

## 2023-06-16 DIAGNOSIS — K5000 Crohn's disease of small intestine without complications: Secondary | ICD-10-CM

## 2023-06-16 MED ORDER — SODIUM CHLORIDE 0.9 % IV SOLN
400.0000 mg | INTRAVENOUS | Status: DC
  Administered 2023-06-16: 400 mg via INTRAVENOUS
  Filled 2023-06-16: qty 40

## 2023-06-16 MED ORDER — METHYLPREDNISOLONE SODIUM SUCC 40 MG IJ SOLR
20.0000 mg | Freq: Once | INTRAMUSCULAR | Status: AC
  Administered 2023-06-16: 20 mg via INTRAVENOUS
  Filled 2023-06-16: qty 0.5

## 2023-06-16 MED ORDER — SODIUM CHLORIDE 0.9 % IV SOLN: INTRAVENOUS | Status: DC | PRN

## 2023-06-16 MED ORDER — ACETAMINOPHEN 325 MG PO TABS
650.0000 mg | ORAL_TABLET | Freq: Once | ORAL | Status: AC
  Administered 2023-06-16: 650 mg via ORAL
  Filled 2023-06-16: qty 2

## 2023-06-16 NOTE — Progress Notes (Signed)
PATIENT CARE CENTER NOTE   Diagnosis: Crohn's Disease     Provider: Charlott Rakes MD     Procedure: Remicade 400mg  infusion     Note: Patient received Remicade infusion via PIV. Patient premedicate with Tylenol PO and Solumedrol IV.  Infusion titrated per protocol. Patient tolerated well with no adverse reaction. Vital signs stable. Declined AVS. Patient to come back every 8 weeks for infusion, instructed to scheduled next visit at front desk, verbalized understanding. Alert, oriented and ambulatory at discharge.

## 2023-07-13 NOTE — Addendum Note (Signed)
Encounter addended by: Quentin Angst, MD on: 07/13/2023 1:20 PM  Actions taken: Charge Capture section accepted

## 2023-07-21 NOTE — Addendum Note (Signed)
Encounter addended by: Quentin Angst, MD on: 07/21/2023 3:53 PM  Actions taken: Charge Capture section accepted

## 2023-07-27 DIAGNOSIS — K5090 Crohn's disease, unspecified, without complications: Secondary | ICD-10-CM | POA: Diagnosis not present

## 2023-07-27 DIAGNOSIS — I10 Essential (primary) hypertension: Secondary | ICD-10-CM | POA: Diagnosis not present

## 2023-07-27 DIAGNOSIS — M5431 Sciatica, right side: Secondary | ICD-10-CM | POA: Diagnosis not present

## 2023-07-27 DIAGNOSIS — E7800 Pure hypercholesterolemia, unspecified: Secondary | ICD-10-CM | POA: Diagnosis not present

## 2023-07-27 DIAGNOSIS — Z79620 Long term (current) use of immunosuppressive biologic: Secondary | ICD-10-CM | POA: Diagnosis not present

## 2023-07-27 DIAGNOSIS — E1165 Type 2 diabetes mellitus with hyperglycemia: Secondary | ICD-10-CM | POA: Diagnosis not present

## 2023-07-27 DIAGNOSIS — E1121 Type 2 diabetes mellitus with diabetic nephropathy: Secondary | ICD-10-CM | POA: Diagnosis not present

## 2023-07-27 DIAGNOSIS — J4520 Mild intermittent asthma, uncomplicated: Secondary | ICD-10-CM | POA: Diagnosis not present

## 2023-07-27 DIAGNOSIS — Z79899 Other long term (current) drug therapy: Secondary | ICD-10-CM | POA: Diagnosis not present

## 2023-07-27 DIAGNOSIS — D84821 Immunodeficiency due to drugs: Secondary | ICD-10-CM | POA: Diagnosis not present

## 2023-08-11 ENCOUNTER — Non-Acute Institutional Stay (HOSPITAL_COMMUNITY)
Admission: RE | Admit: 2023-08-11 | Discharge: 2023-08-11 | Disposition: A | Discharge status: Home / Self Care | Payer: Medicare Other | Source: Ambulatory Visit | Attending: Internal Medicine | Admitting: Internal Medicine

## 2023-08-11 DIAGNOSIS — K5010 Crohn's disease of large intestine without complications: Secondary | ICD-10-CM | POA: Diagnosis not present

## 2023-08-11 MED ORDER — SODIUM CHLORIDE 0.9 % IV SOLN: INTRAVENOUS | Status: DC | PRN

## 2023-08-11 MED ORDER — METHYLPREDNISOLONE SODIUM SUCC 40 MG IJ SOLR
20.0000 mg | Freq: Once | INTRAMUSCULAR | Status: AC
  Administered 2023-08-11: 20 mg via INTRAVENOUS
  Filled 2023-08-11: qty 0.5

## 2023-08-11 MED ORDER — SODIUM CHLORIDE 0.9 % IV SOLN
400.0000 mg | INTRAVENOUS | Status: DC
  Administered 2023-08-11: 400 mg via INTRAVENOUS
  Filled 2023-08-11: qty 40

## 2023-08-11 MED ORDER — ACETAMINOPHEN 325 MG PO TABS
650.0000 mg | ORAL_TABLET | Freq: Once | ORAL | Status: AC
  Administered 2023-08-11: 650 mg via ORAL
  Filled 2023-08-11: qty 2

## 2023-08-11 NOTE — Progress Notes (Signed)
PATIENT CARE CENTER NOTE  Diagnosis: Crohn's Disease    Provider: Charlott Rakes MD   Procedure: Remicade 400 mg infusion  Note: Patient received Remicade 400 mg via PIV. Pt pre-medicated PO Tylenol and SIV Solumedrol per orders. Infusion titrated per protocol. Pt tolerated infusion with no adverse reaction.AS offered, but pt declined. Pt to come back every 8 weeks for infusion, and instructed to schedule next appointment. Pt is alert, oriented, and ambulatory at discharge.

## 2023-08-31 DIAGNOSIS — M47896 Other spondylosis, lumbar region: Secondary | ICD-10-CM | POA: Diagnosis not present

## 2023-08-31 DIAGNOSIS — M545 Low back pain, unspecified: Secondary | ICD-10-CM | POA: Diagnosis not present

## 2023-10-06 ENCOUNTER — Non-Acute Institutional Stay (HOSPITAL_COMMUNITY)
Admission: RE | Admit: 2023-10-06 | Discharge: 2023-10-06 | Disposition: A | Discharge status: Home / Self Care | Payer: Medicare Other | Source: Ambulatory Visit | Attending: Internal Medicine | Admitting: Internal Medicine

## 2023-10-06 DIAGNOSIS — K5010 Crohn's disease of large intestine without complications: Secondary | ICD-10-CM | POA: Insufficient documentation

## 2023-10-06 DIAGNOSIS — K5090 Crohn's disease, unspecified, without complications: Secondary | ICD-10-CM | POA: Diagnosis not present

## 2023-10-06 MED ORDER — SODIUM CHLORIDE 0.9 % IV SOLN
400.0000 mg | INTRAVENOUS | Status: DC
  Administered 2023-10-06: 400 mg via INTRAVENOUS
  Filled 2023-10-06: qty 40

## 2023-10-06 MED ORDER — SODIUM CHLORIDE 0.9 % IV SOLN: INTRAVENOUS | Status: DC | PRN

## 2023-10-06 MED ORDER — METHYLPREDNISOLONE SODIUM SUCC 40 MG IJ SOLR
20.0000 mg | Freq: Once | INTRAMUSCULAR | Status: AC
  Administered 2023-10-06: 20 mg via INTRAVENOUS
  Filled 2023-10-06: qty 0.5

## 2023-10-06 MED ORDER — ACETAMINOPHEN 325 MG PO TABS
650.0000 mg | ORAL_TABLET | Freq: Once | ORAL | Status: AC
  Administered 2023-10-06: 650 mg via ORAL
  Filled 2023-10-06: qty 2

## 2023-10-06 NOTE — Progress Notes (Signed)
PATIENT CARE CENTER NOTE   Diagnosis: Crohn's disease     Provider:  Charlott Rakes MD     Procedure: Remicade 400mg   infusion     Note: Patient received Remicade infusion via PIV. Patient premedicated with Tylenol PO and Solumedrol IV per orders. Infusion titrated per protocol. Patient tolerated well with no adverse reaction. Pt BP was 152/79 at the end of infusion, pt states she feels fine and that she take BP medication in the evening. Pt states she will check her BP at home , instructed pt to reach out to PCP or whoever prescribes her BP medication if she remains elevated at home, pt verbalized understanding. Lawson Fiscal, clinical staff for Dr. Bosie Clos notified, will inform provider, no other intervention necessary. Pt declined AVS. Patient to come back every 8 weeks for infusion, pt states she will schedule this visit at front desk prior to leaving. Alert, oriented and ambulatory at discharge.

## 2023-10-28 ENCOUNTER — Inpatient Hospital Stay: Payer: Medicare Other | Attending: Oncology | Admitting: Oncology

## 2023-10-28 ENCOUNTER — Inpatient Hospital Stay: Payer: Medicare Other

## 2023-10-28 VITALS — BP 127/68 | HR 92 | Temp 97.8°F | Resp 18 | Ht 64.0 in | Wt 193.2 lb

## 2023-10-28 DIAGNOSIS — C8519 Unspecified B-cell lymphoma, extranodal and solid organ sites: Secondary | ICD-10-CM | POA: Diagnosis not present

## 2023-10-28 DIAGNOSIS — K5090 Crohn's disease, unspecified, without complications: Secondary | ICD-10-CM | POA: Diagnosis not present

## 2023-10-28 DIAGNOSIS — Z23 Encounter for immunization: Secondary | ICD-10-CM | POA: Insufficient documentation

## 2023-10-28 DIAGNOSIS — K922 Gastrointestinal hemorrhage, unspecified: Secondary | ICD-10-CM | POA: Insufficient documentation

## 2023-10-28 DIAGNOSIS — G8912 Acute post-thoracotomy pain: Secondary | ICD-10-CM | POA: Diagnosis not present

## 2023-10-28 DIAGNOSIS — C884 Extranodal marginal zone b-cell lymphoma of mucosa-associated lymphoid tissue (malt-lymphoma) not having achieved remission: Secondary | ICD-10-CM | POA: Diagnosis not present

## 2023-10-28 DIAGNOSIS — D638 Anemia in other chronic diseases classified elsewhere: Secondary | ICD-10-CM | POA: Insufficient documentation

## 2023-10-28 LAB — CBC WITH DIFFERENTIAL (CANCER CENTER ONLY)
Abs Immature Granulocytes: 0.01 10*3/uL (ref 0.00–0.07)
Basophils Absolute: 0.1 10*3/uL (ref 0.0–0.1)
Basophils Relative: 1 %
Eosinophils Absolute: 0.4 10*3/uL (ref 0.0–0.5)
Eosinophils Relative: 6 %
HCT: 41.4 % (ref 36.0–46.0)
Hemoglobin: 13.6 g/dL (ref 12.0–15.0)
Immature Granulocytes: 0 %
Lymphocytes Relative: 43 %
Lymphs Abs: 3 10*3/uL (ref 0.7–4.0)
MCH: 34.1 pg — ABNORMAL HIGH (ref 26.0–34.0)
MCHC: 32.9 g/dL (ref 30.0–36.0)
MCV: 103.8 fL — ABNORMAL HIGH (ref 80.0–100.0)
Monocytes Absolute: 0.5 10*3/uL (ref 0.1–1.0)
Monocytes Relative: 8 %
Neutro Abs: 2.9 10*3/uL (ref 1.7–7.7)
Neutrophils Relative %: 42 %
Platelet Count: 231 10*3/uL (ref 150–400)
RBC: 3.99 MIL/uL (ref 3.87–5.11)
RDW: 13.7 % (ref 11.5–15.5)
WBC Count: 6.9 10*3/uL (ref 4.0–10.5)
nRBC: 0 % (ref 0.0–0.2)

## 2023-10-28 LAB — CMP (CANCER CENTER ONLY)
ALT: 27 U/L (ref 0–44)
AST: 21 U/L (ref 15–41)
Albumin: 3.9 g/dL (ref 3.5–5.0)
Alkaline Phosphatase: 54 U/L (ref 38–126)
Anion gap: 7 (ref 5–15)
BUN: 16 mg/dL (ref 8–23)
CO2: 32 mmol/L (ref 22–32)
Calcium: 10.2 mg/dL (ref 8.9–10.3)
Chloride: 102 mmol/L (ref 98–111)
Creatinine: 0.93 mg/dL (ref 0.44–1.00)
GFR, Estimated: >60 mL/min (ref >60)
Glucose, Bld: 233 mg/dL — ABNORMAL HIGH (ref 70–99)
Potassium: 4.3 mmol/L (ref 3.5–5.1)
Sodium: 141 mmol/L (ref 135–145)
Total Bilirubin: 0.7 mg/dL (ref <1.2)
Total Protein: 7.8 g/dL (ref 6.5–8.1)

## 2023-10-28 MED ORDER — INFLUENZA VAC A&B SURF ANT ADJ 0.5 ML IM SUSY
0.5000 mL | PREFILLED_SYRINGE | Freq: Once | INTRAMUSCULAR | Status: AC
  Administered 2023-10-28: 0.5 mL via INTRAMUSCULAR
  Filled 2023-10-28: qty 0.5

## 2023-10-28 NOTE — Progress Notes (Signed)
  [facility] OFFICE PROGRESS NOTE   Diagnosis: MALT lymphoma  INTERVAL HISTORY:   [name] returns as scheduled.  She feels well.  Good appetite.  No fever or night sweats.  No palpable lymph nodes.  She reports the Crohn's disease is controlled with medical therapy.  She continues to have pain at the right chest wall, relieved with gabapentin.  Objective:  Vital signs in last 24 hours:  Blood pressure 127/68, pulse 92, temperature 97.8 F (36.6 C), temperature source Temporal, resp. rate 18, height 5\' 4"  (1.626 m), weight 193 lb 3.2 oz (87.6 kg), SpO2 100%.    Lymphatics: No cervical, supraclavicular, axillary, or inguinal nodes Resp: Scattered inspiratory rhonchi at the posterior chest bilaterally, no respiratory distress Cardio: Regular rate and rhythm GI: No hepatosplenomegaly Vascular: No leg edema, left lower leg is slightly larger than the right side  Skin: Keloid at the right thoracotomy scars, no evidence of recurrent tumor   Lab Results:  Lab Results  Component Value Date   WBC 6.9 10/28/2023   HGB 13.6 10/28/2023   HCT 41.4 10/28/2023   MCV 103.8 (H) 10/28/2023   PLT 231 10/28/2023   NEUTROABS 2.9 10/28/2023    CMP  Lab Results  Component Value Date   NA 141 10/27/2022   K 3.6 10/27/2022   CL 101 10/27/2022   CO2 31 10/27/2022   GLUCOSE 171 (H) 10/27/2022   BUN 11 10/27/2022   CREATININE 0.72 10/27/2022   CALCIUM 9.9 10/27/2022   PROT 8.4 (H) 10/27/2022   ALBUMIN 4.3 10/27/2022   AST 18 10/27/2022   ALT 23 10/27/2022   ALKPHOS 58 10/27/2022   BILITOT 0.8 10/27/2022   GFRNONAA >60 10/27/2022   GFRAA >60 10/30/2019     Medications: I have reviewed the patient's current medications.   Assessment/Plan: Non-Hodgkin lymphoma, low grade lymphoma involving the lungs, diagnosed in June 2006. No clinical evidence of progression. Crohn's disease, followed by Dr. Bosie Clos.  She continues Remicade Post thoracotomy pain, persistent,  initially improved with Elavil, now taking gabapentin Anemia secondary to chronic disease and gastrointestinal bleeding. Hemoglobin is improved. History of thrombocytosis. History of anorexia/weight loss in the setting of active Crohn disease. Improved. History of a serum monoclonal protein, IgG kappa, stable on repeat serum protein electrophoresis studies, last in  December 2023 History of asthma. . Area of distortion/calcifications in the lower inner left breast July 2022-seed localized lumpectomy 08/14/2021-complex sclerosing lesion with focal atypical ductal hyperplasia, no malignancy       Disposition: [name] remains in clinical remission from the low-grade lymphoma.  She has a history of a serum monoclonal protein.  We will follow-up on the myeloma panel from today.  She would like to continue follow-up at the Cancer center.  She will return for an office and lab visit in 1 year. [name] received an influenza vaccine today.  Thornton Papas, MD  10/28/2023  11:32 AM

## 2023-12-01 ENCOUNTER — Ambulatory Visit (HOSPITAL_COMMUNITY)
Admission: RE | Admit: 2023-12-01 | Discharge: 2023-12-01 | Disposition: A | Discharge status: Home / Self Care | Payer: Medicare Other | Source: Ambulatory Visit | Attending: Internal Medicine | Admitting: Internal Medicine

## 2023-12-01 DIAGNOSIS — K5090 Crohn's disease, unspecified, without complications: Secondary | ICD-10-CM | POA: Insufficient documentation

## 2023-12-01 MED ORDER — SODIUM CHLORIDE 0.9 % IV SOLN: INTRAVENOUS | Status: DC | PRN

## 2023-12-01 MED ORDER — METHYLPREDNISOLONE SODIUM SUCC 40 MG IJ SOLR
20.0000 mg | Freq: Once | INTRAMUSCULAR | Status: AC
  Administered 2023-12-01: 20 mg via INTRAVENOUS
  Filled 2023-12-01: qty 0.5

## 2023-12-01 MED ORDER — ACETAMINOPHEN 325 MG PO TABS
650.0000 mg | ORAL_TABLET | Freq: Once | ORAL | Status: AC
  Administered 2023-12-01: 650 mg via ORAL
  Filled 2023-12-01: qty 2

## 2023-12-01 MED ORDER — SODIUM CHLORIDE 0.9 % IV SOLN
400.0000 mg | INTRAVENOUS | Status: DC
  Administered 2023-12-01: 400 mg via INTRAVENOUS
  Filled 2023-12-01: qty 40

## 2023-12-01 NOTE — Progress Notes (Signed)
 PATIENT CARE CENTER NOTE  Diagnosis: Crohn's Disease   Provider: Jerrell Sol, MD  Procedure: Remicade  400 mg infusion  Note: Pt received Remicade  400 mg infusion via PIV. Pty premedicated with PO Tylenol  and IV Solumedrol per orders. Pt tolerated infusion with no adverse reaction. Infusion titrated per protocol. AVS offered, but pt declined. Pt advised that she should schedule next infusion for 8 weeks at the front desk.  Pt is alert, oriented, and ambulatory at discharge.

## 2023-12-22 DIAGNOSIS — K219 Gastro-esophageal reflux disease without esophagitis: Secondary | ICD-10-CM | POA: Diagnosis not present

## 2023-12-22 DIAGNOSIS — K5010 Crohn's disease of large intestine without complications: Secondary | ICD-10-CM | POA: Diagnosis not present

## 2023-12-28 DIAGNOSIS — D492 Neoplasm of unspecified behavior of bone, soft tissue, and skin: Secondary | ICD-10-CM | POA: Diagnosis not present

## 2024-01-26 ENCOUNTER — Non-Acute Institutional Stay (HOSPITAL_COMMUNITY)
Admission: RE | Admit: 2024-01-26 | Discharge: 2024-01-26 | Disposition: A | Discharge status: Home / Self Care | Payer: Medicare Other | Source: Ambulatory Visit | Attending: Internal Medicine | Admitting: Internal Medicine

## 2024-01-26 VITALS — BP 144/67 | HR 75 | Temp 97.8°F | Resp 16 | Wt 196.0 lb

## 2024-01-26 DIAGNOSIS — K5090 Crohn's disease, unspecified, without complications: Secondary | ICD-10-CM | POA: Insufficient documentation

## 2024-01-26 DIAGNOSIS — K50919 Crohn's disease, unspecified, with unspecified complications: Secondary | ICD-10-CM

## 2024-01-26 LAB — COMPREHENSIVE METABOLIC PANEL
ALT: 23 U/L (ref 0–44)
AST: 25 U/L (ref 15–41)
Albumin: 3.3 g/dL — ABNORMAL LOW (ref 3.5–5.0)
Alkaline Phosphatase: 50 U/L (ref 38–126)
Anion gap: 10 (ref 5–15)
BUN: 15 mg/dL (ref 8–23)
CO2: 26 mmol/L (ref 22–32)
Calcium: 8.9 mg/dL (ref 8.9–10.3)
Chloride: 103 mmol/L (ref 98–111)
Creatinine, Ser: 0.64 mg/dL (ref 0.44–1.00)
GFR, Estimated: >60 mL/min (ref >60)
Glucose, Bld: 173 mg/dL — ABNORMAL HIGH (ref 70–99)
Potassium: 3.8 mmol/L (ref 3.5–5.1)
Sodium: 139 mmol/L (ref 135–145)
Total Bilirubin: 0.9 mg/dL (ref 0.0–1.2)
Total Protein: 7.2 g/dL (ref 6.5–8.1)

## 2024-01-26 LAB — CBC
HCT: 39 % (ref 36.0–46.0)
Hemoglobin: 12.7 g/dL (ref 12.0–15.0)
MCH: 34.2 pg — ABNORMAL HIGH (ref 26.0–34.0)
MCHC: 32.6 g/dL (ref 30.0–36.0)
MCV: 105.1 fL — ABNORMAL HIGH (ref 80.0–100.0)
Platelets: 210 10*3/uL (ref 150–400)
RBC: 3.71 MIL/uL — ABNORMAL LOW (ref 3.87–5.11)
RDW: 13.6 % (ref 11.5–15.5)
WBC: 6.8 10*3/uL (ref 4.0–10.5)
nRBC: 0 % (ref 0.0–0.2)

## 2024-01-26 MED ORDER — METHYLPREDNISOLONE SODIUM SUCC 40 MG IJ SOLR
20.0000 mg | Freq: Once | INTRAMUSCULAR | Status: AC
  Administered 2024-01-26: 20 mg via INTRAVENOUS
  Filled 2024-01-26: qty 0.5

## 2024-01-26 MED ORDER — ACETAMINOPHEN 325 MG PO TABS
650.0000 mg | ORAL_TABLET | Freq: Once | ORAL | Status: AC
  Administered 2024-01-26: 650 mg via ORAL
  Filled 2024-01-26: qty 2

## 2024-01-26 MED ORDER — SODIUM CHLORIDE 0.9 % IV SOLN: INTRAVENOUS | Status: DC | PRN

## 2024-01-26 MED ORDER — SODIUM CHLORIDE 0.9 % IV SOLN
400.0000 mg | INTRAVENOUS | Status: DC
  Administered 2024-01-26: 400 mg via INTRAVENOUS
  Filled 2024-01-26: qty 40

## 2024-01-26 NOTE — Progress Notes (Signed)
 PATIENT CARE CENTER NOTE  Diagnosis: Crohn's Disease   Provider: Charlott Rakes, MD  Procedure: Remicade 400 mg infusion  Note: Patient received Remicade 400 mg infusion via PIV. Per pt she is supposed to have labs drawn prior to this infusion. RN called providers office and spoke with [facility], CMA. Melissa gave verbal order from Charlott Rakes, MD for pt to have CBC and CMP drawn today. Ordered labs drawn with PIV placement. Pt pre-medicated per orders with PO Tylenol and IV Solu-medrol. Infusion titrated per protocol. Halfway through infusion, pts BP elevated at 152/72. Pt reports feeling fine, and says that she takes her BP medications in the afternoon. RN called providers office, and left a message about pts BP with the front desk who said she would let the medical assistant know. Pt tolerated infusion with no adverse reaction. At end of infusion pts BP is 159/77 and re-check is 144/67. Pt reports that she still feels fine. RN advised pt to take BP medication as prescribed, and to recheck BP when she is home with home machine. Pt advised that if BP remains elevated or she develops any symptoms she should seek medical attention, pt verbalized understanding. AVS offered, but pt declined. Pt advised that she should schedule her next infusion for 8 weeks at the front desk. Pt is alert, oriented, and ambulatory at discharge.

## 2024-02-04 DIAGNOSIS — D84821 Immunodeficiency due to drugs: Secondary | ICD-10-CM | POA: Diagnosis not present

## 2024-02-04 DIAGNOSIS — K5010 Crohn's disease of large intestine without complications: Secondary | ICD-10-CM | POA: Diagnosis not present

## 2024-02-04 DIAGNOSIS — E7800 Pure hypercholesterolemia, unspecified: Secondary | ICD-10-CM | POA: Diagnosis not present

## 2024-02-04 DIAGNOSIS — E1121 Type 2 diabetes mellitus with diabetic nephropathy: Secondary | ICD-10-CM | POA: Diagnosis not present

## 2024-02-04 DIAGNOSIS — Z0000 Encounter for general adult medical examination without abnormal findings: Secondary | ICD-10-CM | POA: Diagnosis not present

## 2024-02-04 DIAGNOSIS — J4520 Mild intermittent asthma, uncomplicated: Secondary | ICD-10-CM | POA: Diagnosis not present

## 2024-02-04 DIAGNOSIS — I10 Essential (primary) hypertension: Secondary | ICD-10-CM | POA: Diagnosis not present

## 2024-02-04 DIAGNOSIS — E559 Vitamin D deficiency, unspecified: Secondary | ICD-10-CM | POA: Diagnosis not present

## 2024-02-04 DIAGNOSIS — E1165 Type 2 diabetes mellitus with hyperglycemia: Secondary | ICD-10-CM | POA: Diagnosis not present

## 2024-02-04 DIAGNOSIS — Z79899 Other long term (current) drug therapy: Secondary | ICD-10-CM | POA: Diagnosis not present

## 2024-02-04 DIAGNOSIS — G4733 Obstructive sleep apnea (adult) (pediatric): Secondary | ICD-10-CM | POA: Diagnosis not present

## 2024-02-09 DIAGNOSIS — H52223 Regular astigmatism, bilateral: Secondary | ICD-10-CM | POA: Diagnosis not present

## 2024-02-09 DIAGNOSIS — H524 Presbyopia: Secondary | ICD-10-CM | POA: Diagnosis not present

## 2024-02-09 DIAGNOSIS — H5212 Myopia, left eye: Secondary | ICD-10-CM | POA: Diagnosis not present

## 2024-02-29 ENCOUNTER — Other Ambulatory Visit: Payer: Self-pay | Admitting: Obstetrics and Gynecology

## 2024-02-29 DIAGNOSIS — Z1231 Encounter for screening mammogram for malignant neoplasm of breast: Secondary | ICD-10-CM

## 2024-03-27 ENCOUNTER — Ambulatory Visit (HOSPITAL_COMMUNITY)
Admission: RE | Admit: 2024-03-27 | Discharge: 2024-03-27 | Disposition: A | Discharge status: Home / Self Care | Source: Ambulatory Visit | Attending: Internal Medicine | Admitting: Internal Medicine

## 2024-03-27 DIAGNOSIS — K5010 Crohn's disease of large intestine without complications: Secondary | ICD-10-CM | POA: Diagnosis not present

## 2024-03-27 MED ORDER — SODIUM CHLORIDE 0.9 % IV SOLN: INTRAVENOUS | Status: DC | PRN

## 2024-03-27 MED ORDER — SODIUM CHLORIDE 0.9 % IV SOLN
400.0000 mg | INTRAVENOUS | Status: DC
  Administered 2024-03-27: 400 mg via INTRAVENOUS
  Filled 2024-03-27: qty 40

## 2024-03-27 MED ORDER — ACETAMINOPHEN 325 MG PO TABS
650.0000 mg | ORAL_TABLET | Freq: Once | ORAL | Status: AC
  Administered 2024-03-27: 650 mg via ORAL
  Filled 2024-03-27: qty 2

## 2024-03-27 MED ORDER — METHYLPREDNISOLONE SODIUM SUCC 40 MG IJ SOLR
20.0000 mg | Freq: Once | INTRAMUSCULAR | Status: AC
  Administered 2024-03-27: 20 mg via INTRAVENOUS
  Filled 2024-03-27: qty 0.5

## 2024-03-27 NOTE — Progress Notes (Signed)
 PATIENT CARE CENTER NOTE  Diagnosis: Crohn's Disease   Provider: Baldo Bonds, MD  Procedure: Remicade  400 mg infusion  Note: Patient received Remicade  400 mg infusion via PIV. Pt pre-medicated per orders with PO Tylenol  and IV Solu-medrol . Infusion titrated per protocol. Pt tolerated the infusion with no adverse reaction. AVS offered, but pt declined. Pt advised to schedule her next infusion for 8 weeks at the front desk. Pt is alert, oriented, and ambulatory at discharge.

## 2024-04-03 ENCOUNTER — Encounter (HOSPITAL_COMMUNITY): Payer: Self-pay

## 2024-04-10 ENCOUNTER — Ambulatory Visit
Admission: RE | Admit: 2024-04-10 | Discharge: 2024-04-10 | Disposition: A | Discharge status: Home / Self Care | Source: Ambulatory Visit | Attending: Obstetrics and Gynecology | Admitting: Obstetrics and Gynecology

## 2024-04-10 DIAGNOSIS — Z1231 Encounter for screening mammogram for malignant neoplasm of breast: Secondary | ICD-10-CM

## 2024-05-10 DIAGNOSIS — K5010 Crohn's disease of large intestine without complications: Secondary | ICD-10-CM | POA: Diagnosis not present

## 2024-05-22 ENCOUNTER — Encounter (HOSPITAL_COMMUNITY)

## 2024-05-22 DIAGNOSIS — K5090 Crohn's disease, unspecified, without complications: Secondary | ICD-10-CM | POA: Diagnosis not present

## 2024-05-22 DIAGNOSIS — E1165 Type 2 diabetes mellitus with hyperglycemia: Secondary | ICD-10-CM | POA: Diagnosis not present

## 2024-05-22 DIAGNOSIS — J4520 Mild intermittent asthma, uncomplicated: Secondary | ICD-10-CM | POA: Diagnosis not present

## 2024-05-22 DIAGNOSIS — E7800 Pure hypercholesterolemia, unspecified: Secondary | ICD-10-CM | POA: Diagnosis not present

## 2024-05-23 DIAGNOSIS — K5090 Crohn's disease, unspecified, without complications: Secondary | ICD-10-CM | POA: Diagnosis not present

## 2024-07-18 DIAGNOSIS — K5090 Crohn's disease, unspecified, without complications: Secondary | ICD-10-CM | POA: Diagnosis not present

## 2024-07-23 DIAGNOSIS — E1165 Type 2 diabetes mellitus with hyperglycemia: Secondary | ICD-10-CM | POA: Diagnosis not present

## 2024-07-23 DIAGNOSIS — J4520 Mild intermittent asthma, uncomplicated: Secondary | ICD-10-CM | POA: Diagnosis not present

## 2024-07-23 DIAGNOSIS — E7800 Pure hypercholesterolemia, unspecified: Secondary | ICD-10-CM | POA: Diagnosis not present

## 2024-08-01 DIAGNOSIS — K5090 Crohn's disease, unspecified, without complications: Secondary | ICD-10-CM | POA: Diagnosis not present

## 2024-08-07 DIAGNOSIS — I10 Essential (primary) hypertension: Secondary | ICD-10-CM | POA: Diagnosis not present

## 2024-08-07 DIAGNOSIS — E1121 Type 2 diabetes mellitus with diabetic nephropathy: Secondary | ICD-10-CM | POA: Diagnosis not present

## 2024-08-07 DIAGNOSIS — E1165 Type 2 diabetes mellitus with hyperglycemia: Secondary | ICD-10-CM | POA: Diagnosis not present

## 2024-08-07 DIAGNOSIS — E7800 Pure hypercholesterolemia, unspecified: Secondary | ICD-10-CM | POA: Diagnosis not present

## 2024-08-15 ENCOUNTER — Telehealth: Payer: Self-pay

## 2024-08-15 NOTE — Telephone Encounter (Signed)
 The patient was rescheduled to November 02, 2024, due to changes in the provider's office hours. A letter has been sent to the patient regarding this update.

## 2024-08-22 DIAGNOSIS — E1165 Type 2 diabetes mellitus with hyperglycemia: Secondary | ICD-10-CM | POA: Diagnosis not present

## 2024-08-22 DIAGNOSIS — E7800 Pure hypercholesterolemia, unspecified: Secondary | ICD-10-CM | POA: Diagnosis not present

## 2024-08-22 DIAGNOSIS — J4520 Mild intermittent asthma, uncomplicated: Secondary | ICD-10-CM | POA: Diagnosis not present

## 2024-10-27 ENCOUNTER — Other Ambulatory Visit: Payer: Medicare Other

## 2024-10-27 ENCOUNTER — Ambulatory Visit: Payer: Medicare Other | Admitting: Oncology

## 2024-11-01 ENCOUNTER — Other Ambulatory Visit: Payer: Self-pay | Admitting: *Deleted

## 2024-11-01 DIAGNOSIS — C8519 Unspecified B-cell lymphoma, extranodal and solid organ sites: Secondary | ICD-10-CM

## 2024-11-02 ENCOUNTER — Inpatient Hospital Stay: Attending: Oncology

## 2024-11-02 ENCOUNTER — Inpatient Hospital Stay: Admitting: Oncology

## 2024-11-02 VITALS — BP 112/70 | HR 87 | Temp 97.8°F | Resp 18 | Ht 64.0 in | Wt 193.6 lb

## 2024-11-02 DIAGNOSIS — C8519 Unspecified B-cell lymphoma, extranodal and solid organ sites: Secondary | ICD-10-CM | POA: Diagnosis not present

## 2024-11-02 DIAGNOSIS — R7689 Other specified abnormal immunological findings in serum: Secondary | ICD-10-CM | POA: Insufficient documentation

## 2024-11-02 DIAGNOSIS — G8912 Acute post-thoracotomy pain: Secondary | ICD-10-CM | POA: Diagnosis not present

## 2024-11-02 DIAGNOSIS — K50911 Crohn's disease, unspecified, with rectal bleeding: Secondary | ICD-10-CM | POA: Diagnosis not present

## 2024-11-02 DIAGNOSIS — C859A Non-Hodgkin lymphoma, unspecified, in remission: Secondary | ICD-10-CM | POA: Insufficient documentation

## 2024-11-02 DIAGNOSIS — J45909 Unspecified asthma, uncomplicated: Secondary | ICD-10-CM | POA: Diagnosis not present

## 2024-11-02 DIAGNOSIS — D638 Anemia in other chronic diseases classified elsewhere: Secondary | ICD-10-CM | POA: Diagnosis not present

## 2024-11-02 LAB — CBC WITH DIFFERENTIAL (CANCER CENTER ONLY)
Abs Immature Granulocytes: 0.02 K/uL (ref 0.00–0.07)
Basophils Absolute: 0 K/uL (ref 0.0–0.1)
Basophils Relative: 0 %
Eosinophils Absolute: 0.5 K/uL (ref 0.0–0.5)
Eosinophils Relative: 7 %
HCT: 40.6 % (ref 36.0–46.0)
Hemoglobin: 13.5 g/dL (ref 12.0–15.0)
Immature Granulocytes: 0 %
Lymphocytes Relative: 48 %
Lymphs Abs: 3.3 K/uL (ref 0.7–4.0)
MCH: 34.5 pg — ABNORMAL HIGH (ref 26.0–34.0)
MCHC: 33.3 g/dL (ref 30.0–36.0)
MCV: 103.8 fL — ABNORMAL HIGH (ref 80.0–100.0)
Monocytes Absolute: 0.6 K/uL (ref 0.1–1.0)
Monocytes Relative: 8 %
Neutro Abs: 2.6 K/uL (ref 1.7–7.7)
Neutrophils Relative %: 37 %
Platelet Count: 215 K/uL (ref 150–400)
RBC: 3.91 MIL/uL (ref 3.87–5.11)
RDW: 14.3 % (ref 11.5–15.5)
WBC Count: 7.1 K/uL (ref 4.0–10.5)
nRBC: 0 % (ref 0.0–0.2)

## 2024-11-02 LAB — CMP (CANCER CENTER ONLY)
ALT: 29 U/L (ref 0–44)
AST: 29 U/L (ref 15–41)
Albumin: 4.1 g/dL (ref 3.5–5.0)
Alkaline Phosphatase: 78 U/L (ref 38–126)
Anion gap: 11 (ref 5–15)
BUN: 8 mg/dL (ref 8–23)
CO2: 28 mmol/L (ref 22–32)
Calcium: 10.2 mg/dL (ref 8.9–10.3)
Chloride: 101 mmol/L (ref 98–111)
Creatinine: 0.85 mg/dL (ref 0.44–1.00)
GFR, Estimated: >60 mL/min (ref >60)
Glucose, Bld: 173 mg/dL — ABNORMAL HIGH (ref 70–99)
Potassium: 3.6 mmol/L (ref 3.5–5.1)
Sodium: 139 mmol/L (ref 135–145)
Total Bilirubin: 0.7 mg/dL (ref 0.0–1.2)
Total Protein: 8.2 g/dL — ABNORMAL HIGH (ref 6.5–8.1)

## 2024-11-02 NOTE — Progress Notes (Signed)
°  [facility] OFFICE PROGRESS NOTE   Diagnosis: Non-Hodgkin's lymphoma  INTERVAL HISTORY:   [name] returns as scheduled.  She reports pain and weakness of the left hand for the past 1-2 months.  She has been evaluated by orthopedic surgery and was diagnosed with a pinched nerve at the left elbow.  She is scheduled for follow-up later this month.  No fever or night sweats.  She continues every 8-week home infusion therapy for treatment of inflammatory bowel disease.  Objective:  Vital signs in last 24 hours:  Blood pressure 112/70, pulse 87, temperature 97.8 F (36.6 C), temperature source Temporal, resp. rate 18, height 5' 4 (1.626 m), weight 193 lb 9.6 oz (87.8 kg), SpO2 96%.   Lymphatics: No cervical, supraclavicular, axillary, or inguinal nodes Resp: Scattered coarse end inspiratory rhonchi, no respiratory distress Cardio: Regular rate and rhythm GI: No hepatosplenomegaly Vascular: No leg edema Neuro: The motor exam appears intact at the left arm and hand Skin: Right chest scar with keloid formation, no evidence for recurrent tumor  Lab Results:  Lab Results  Component Value Date   WBC 7.1 11/02/2024   HGB 13.5 11/02/2024   HCT 40.6 11/02/2024   MCV 103.8 (H) 11/02/2024   PLT 215 11/02/2024   NEUTROABS 2.6 11/02/2024    CMP  Lab Results  Component Value Date   NA 139 11/02/2024   K 3.6 11/02/2024   CL 101 11/02/2024   CO2 28 11/02/2024   GLUCOSE 173 (H) 11/02/2024   BUN 8 11/02/2024   CREATININE 0.85 11/02/2024   CALCIUM 10.2 11/02/2024   PROT 8.2 (H) 11/02/2024   ALBUMIN 4.1 11/02/2024   AST 29 11/02/2024   ALT 29 11/02/2024   ALKPHOS 78 11/02/2024   BILITOT 0.7 11/02/2024   GFRNONAA >60 11/02/2024   GFRAA >60 10/30/2019    No results found for: CEA1, CEA, CAN199, CA125  No results found for: INR, LABPROT  Imaging:  No results found.  Medications: I have reviewed the patient's current  medications.   Assessment/Plan:  Non-Hodgkin lymphoma, low grade lymphoma involving the lungs, diagnosed in June 2006. No clinical evidence of progression. Crohn's disease, followed by Dr. Dianna.  She continues Remicade  Post thoracotomy pain, persistent, initially improved with Elavil , now taking gabapentin Anemia secondary to chronic disease and gastrointestinal bleeding. Hemoglobin is improved. History of thrombocytosis. History of anorexia/weight loss in the setting of active Crohn disease. Improved. History of a serum monoclonal protein, IgG kappa, stable on repeat serum protein electrophoresis studies, last in  December 2023 History of asthma. . Area of distortion/calcifications in the lower inner left breast July 2022-seed localized lumpectomy 08/14/2021-complex sclerosing lesion with focal atypical ductal hyperplasia, no malignancy     Disposition: [name] has a remote history of low-grade non-Hodgkin's lymphoma involving the lung.  She is in clinical remission.  She also has a history of a serum monoclonal IgG kappa protein.  We will follow-up on the myeloma panel from today.  The etiology of the left hand pain/weakness is unclear.  She will continue follow-up with orthopedics.  I doubt her symptoms are related to the history of lymphoma.  I am available to see her as needed.  She will return for an office and lab visit in 1 year.  Arley Hof, MD  11/02/2024  12:32 PM

## 2024-11-06 ENCOUNTER — Ambulatory Visit: Payer: Self-pay | Admitting: Oncology

## 2024-11-06 LAB — MULTIPLE MYELOMA PANEL, SERUM
Albumin SerPl Elph-Mcnc: 3.6 g/dL (ref 2.9–4.4)
Albumin/Glob SerPl: 1 (ref 0.7–1.7)
Alpha 1: 0.2 g/dL (ref 0.0–0.4)
Alpha2 Glob SerPl Elph-Mcnc: 0.7 g/dL (ref 0.4–1.0)
B-Globulin SerPl Elph-Mcnc: 1.2 g/dL (ref 0.7–1.3)
Gamma Glob SerPl Elph-Mcnc: 1.9 g/dL — ABNORMAL HIGH (ref 0.4–1.8)
Globulin, Total: 4 g/dL — ABNORMAL HIGH (ref 2.2–3.9)
IgA: 450 mg/dL — ABNORMAL HIGH (ref 64–422)
IgG (Immunoglobin G), Serum: 1975 mg/dL — ABNORMAL HIGH (ref 586–1602)
IgM (Immunoglobulin M), Srm: 125 mg/dL (ref 26–217)
M Protein SerPl Elph-Mcnc: 1.1 g/dL — ABNORMAL HIGH
Total Protein ELP: 7.6 g/dL (ref 6.0–8.5)

## 2024-12-27 ENCOUNTER — Encounter (HOSPITAL_BASED_OUTPATIENT_CLINIC_OR_DEPARTMENT_OTHER): Payer: Self-pay | Admitting: Orthopedic Surgery

## 2024-12-27 ENCOUNTER — Other Ambulatory Visit: Payer: Self-pay

## 2025-01-03 ENCOUNTER — Encounter (HOSPITAL_BASED_OUTPATIENT_CLINIC_OR_DEPARTMENT_OTHER): Admission: RE | Payer: Self-pay | Source: Home / Self Care

## 2025-01-03 ENCOUNTER — Ambulatory Visit (HOSPITAL_BASED_OUTPATIENT_CLINIC_OR_DEPARTMENT_OTHER): Admission: RE | Admit: 2025-01-03 | Source: Home / Self Care | Admitting: Orthopedic Surgery

## 2025-01-03 DIAGNOSIS — I10 Essential (primary) hypertension: Secondary | ICD-10-CM

## 2025-11-01 ENCOUNTER — Inpatient Hospital Stay: Admitting: Oncology

## 2025-11-01 ENCOUNTER — Inpatient Hospital Stay
